# Patient Record
Sex: Female | Born: 1940 | Race: White | Hispanic: No | Smoking: Never smoker
Health system: Southern US, Community
[De-identification: ages and names within clinical notes are randomized; demographics above are authoritative.]

## PROBLEM LIST (undated history)

## (undated) DIAGNOSIS — E785 Hyperlipidemia, unspecified: Secondary | ICD-10-CM

## (undated) DIAGNOSIS — I10 Essential (primary) hypertension: Secondary | ICD-10-CM

## (undated) DIAGNOSIS — J189 Pneumonia, unspecified organism: Secondary | ICD-10-CM

## (undated) DIAGNOSIS — R0600 Dyspnea, unspecified: Secondary | ICD-10-CM

## (undated) DIAGNOSIS — N183 Chronic kidney disease, stage 3 unspecified: Secondary | ICD-10-CM

## (undated) DIAGNOSIS — K5792 Diverticulitis of intestine, part unspecified, without perforation or abscess without bleeding: Secondary | ICD-10-CM

## (undated) DIAGNOSIS — R011 Cardiac murmur, unspecified: Secondary | ICD-10-CM

## (undated) DIAGNOSIS — J42 Unspecified chronic bronchitis: Secondary | ICD-10-CM

## (undated) DIAGNOSIS — I82409 Acute embolism and thrombosis of unspecified deep veins of unspecified lower extremity: Secondary | ICD-10-CM

## (undated) DIAGNOSIS — I779 Disorder of arteries and arterioles, unspecified: Secondary | ICD-10-CM

## (undated) DIAGNOSIS — M1990 Unspecified osteoarthritis, unspecified site: Secondary | ICD-10-CM

## (undated) DIAGNOSIS — I739 Peripheral vascular disease, unspecified: Secondary | ICD-10-CM

## (undated) DIAGNOSIS — S0990XA Unspecified injury of head, initial encounter: Secondary | ICD-10-CM

## (undated) DIAGNOSIS — I499 Cardiac arrhythmia, unspecified: Secondary | ICD-10-CM

## (undated) DIAGNOSIS — I4891 Unspecified atrial fibrillation: Secondary | ICD-10-CM

## (undated) DIAGNOSIS — R112 Nausea with vomiting, unspecified: Secondary | ICD-10-CM

## (undated) DIAGNOSIS — R931 Abnormal findings on diagnostic imaging of heart and coronary circulation: Secondary | ICD-10-CM

## (undated) DIAGNOSIS — I2510 Atherosclerotic heart disease of native coronary artery without angina pectoris: Secondary | ICD-10-CM

## (undated) DIAGNOSIS — C4402 Squamous cell carcinoma of skin of lip: Secondary | ICD-10-CM

## (undated) DIAGNOSIS — Z9889 Other specified postprocedural states: Secondary | ICD-10-CM

## (undated) DIAGNOSIS — Z9289 Personal history of other medical treatment: Secondary | ICD-10-CM

## (undated) HISTORY — DX: Chronic kidney disease, stage 3 (moderate): N18.3

## (undated) HISTORY — DX: Unspecified atrial fibrillation: I48.91

## (undated) HISTORY — DX: Hyperlipidemia, unspecified: E78.5

## (undated) HISTORY — DX: Diverticulitis of intestine, part unspecified, without perforation or abscess without bleeding: K57.92

## (undated) HISTORY — DX: Chronic kidney disease, stage 3 unspecified: N18.30

## (undated) HISTORY — DX: Peripheral vascular disease, unspecified: I73.9

## (undated) HISTORY — DX: Essential (primary) hypertension: I10

## (undated) HISTORY — PX: KNEE ARTHROSCOPY: SHX127

## (undated) HISTORY — DX: Disorder of arteries and arterioles, unspecified: I77.9

## (undated) HISTORY — DX: Abnormal findings on diagnostic imaging of heart and coronary circulation: R93.1

## (undated) HISTORY — PX: MOHS SURGERY: SUR867

## (undated) HISTORY — PX: CARDIAC CATHETERIZATION: SHX172

## (undated) HISTORY — PX: TUBAL LIGATION: SHX77

---
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
## 1978-04-25 HISTORY — PX: VAGINAL HYSTERECTOMY: SUR661

## 1999-04-26 DIAGNOSIS — I82409 Acute embolism and thrombosis of unspecified deep veins of unspecified lower extremity: Secondary | ICD-10-CM

## 1999-04-26 HISTORY — DX: Acute embolism and thrombosis of unspecified deep veins of unspecified lower extremity: I82.409

## 2000-07-18 ENCOUNTER — Ambulatory Visit (HOSPITAL_COMMUNITY): Admission: RE | Admit: 2000-07-18 | Discharge: 2000-07-18 | Payer: Self-pay | Admitting: Orthopedic Surgery

## 2000-08-16 ENCOUNTER — Encounter (HOSPITAL_COMMUNITY): Admission: RE | Admit: 2000-08-16 | Discharge: 2000-09-15 | Payer: Self-pay | Admitting: Oncology

## 2000-08-16 ENCOUNTER — Encounter: Admission: RE | Admit: 2000-08-16 | Discharge: 2000-08-16 | Payer: Self-pay | Admitting: Oncology

## 2000-08-16 ENCOUNTER — Encounter: Payer: Self-pay | Admitting: Internal Medicine

## 2000-12-14 ENCOUNTER — Ambulatory Visit (HOSPITAL_COMMUNITY): Admission: RE | Admit: 2000-12-14 | Discharge: 2000-12-14 | Payer: Self-pay | Admitting: Internal Medicine

## 2000-12-14 ENCOUNTER — Encounter: Payer: Self-pay | Admitting: Internal Medicine

## 2001-01-23 DIAGNOSIS — Z9289 Personal history of other medical treatment: Secondary | ICD-10-CM

## 2001-01-23 HISTORY — DX: Personal history of other medical treatment: Z92.89

## 2001-01-23 HISTORY — PX: CORONARY ARTERY BYPASS GRAFT: SHX141

## 2001-02-07 ENCOUNTER — Encounter: Payer: Self-pay | Admitting: Cardiology

## 2001-02-08 ENCOUNTER — Encounter: Payer: Self-pay | Admitting: Cardiothoracic Surgery

## 2001-02-08 ENCOUNTER — Inpatient Hospital Stay (HOSPITAL_COMMUNITY): Admission: RE | Admit: 2001-02-08 | Discharge: 2001-02-14 | Payer: Self-pay | Admitting: Cardiology

## 2001-02-09 ENCOUNTER — Encounter: Payer: Self-pay | Admitting: Cardiothoracic Surgery

## 2001-02-10 ENCOUNTER — Encounter: Payer: Self-pay | Admitting: Cardiothoracic Surgery

## 2001-02-11 ENCOUNTER — Encounter: Payer: Self-pay | Admitting: Cardiothoracic Surgery

## 2001-02-28 ENCOUNTER — Ambulatory Visit (HOSPITAL_COMMUNITY): Admission: RE | Admit: 2001-02-28 | Discharge: 2001-02-28 | Payer: Self-pay | Admitting: Cardiovascular Disease

## 2001-02-28 ENCOUNTER — Encounter: Payer: Self-pay | Admitting: Cardiovascular Disease

## 2001-03-09 ENCOUNTER — Encounter: Admission: RE | Admit: 2001-03-09 | Discharge: 2001-03-09 | Payer: Self-pay | Admitting: Cardiothoracic Surgery

## 2001-03-09 ENCOUNTER — Encounter: Payer: Self-pay | Admitting: Cardiothoracic Surgery

## 2001-03-12 ENCOUNTER — Encounter (HOSPITAL_COMMUNITY): Admission: RE | Admit: 2001-03-12 | Discharge: 2001-04-11 | Payer: Self-pay | Admitting: Cardiology

## 2001-04-13 ENCOUNTER — Encounter (HOSPITAL_COMMUNITY): Admission: RE | Admit: 2001-04-13 | Discharge: 2001-05-13 | Payer: Self-pay | Admitting: Cardiology

## 2002-10-24 ENCOUNTER — Inpatient Hospital Stay (HOSPITAL_COMMUNITY): Admission: EM | Admit: 2002-10-24 | Discharge: 2002-10-26 | Payer: Self-pay | Admitting: *Deleted

## 2002-10-24 ENCOUNTER — Encounter: Payer: Self-pay | Admitting: *Deleted

## 2003-06-05 ENCOUNTER — Inpatient Hospital Stay (HOSPITAL_COMMUNITY): Admission: EM | Admit: 2003-06-05 | Discharge: 2003-06-07 | Payer: Self-pay | Admitting: Emergency Medicine

## 2004-02-12 ENCOUNTER — Ambulatory Visit (HOSPITAL_COMMUNITY): Admission: RE | Admit: 2004-02-12 | Discharge: 2004-02-12 | Payer: Self-pay | Admitting: Family Medicine

## 2005-09-24 ENCOUNTER — Ambulatory Visit (HOSPITAL_COMMUNITY): Admission: RE | Admit: 2005-09-24 | Discharge: 2005-09-24 | Payer: Self-pay | Admitting: Internal Medicine

## 2005-10-21 ENCOUNTER — Encounter: Admission: RE | Admit: 2005-10-21 | Discharge: 2005-10-21 | Payer: Self-pay | Admitting: Nephrology

## 2005-10-21 ENCOUNTER — Encounter (HOSPITAL_COMMUNITY): Admission: RE | Admit: 2005-10-21 | Discharge: 2005-11-20 | Payer: Self-pay | Admitting: Nephrology

## 2005-10-21 ENCOUNTER — Ambulatory Visit (HOSPITAL_COMMUNITY): Payer: Self-pay | Admitting: Oncology

## 2005-10-23 HISTORY — PX: COLONOSCOPY: SHX174

## 2005-11-10 ENCOUNTER — Ambulatory Visit: Payer: Self-pay | Admitting: Gastroenterology

## 2005-11-10 ENCOUNTER — Ambulatory Visit (HOSPITAL_COMMUNITY): Admission: RE | Admit: 2005-11-10 | Discharge: 2005-11-10 | Payer: Self-pay | Admitting: Gastroenterology

## 2005-11-16 ENCOUNTER — Encounter (HOSPITAL_COMMUNITY): Admission: RE | Admit: 2005-11-16 | Discharge: 2005-12-16 | Payer: Self-pay | Admitting: Oncology

## 2005-12-19 ENCOUNTER — Encounter: Admission: RE | Admit: 2005-12-19 | Discharge: 2005-12-19 | Payer: Self-pay | Admitting: Oncology

## 2005-12-19 ENCOUNTER — Ambulatory Visit (HOSPITAL_COMMUNITY): Payer: Self-pay | Admitting: Oncology

## 2006-10-02 ENCOUNTER — Emergency Department (HOSPITAL_COMMUNITY): Admission: EM | Admit: 2006-10-02 | Discharge: 2006-10-02 | Payer: Self-pay | Admitting: Emergency Medicine

## 2007-06-28 ENCOUNTER — Ambulatory Visit (HOSPITAL_COMMUNITY): Admission: RE | Admit: 2007-06-28 | Discharge: 2007-06-28 | Payer: Self-pay | Admitting: Family Medicine

## 2007-10-24 ENCOUNTER — Ambulatory Visit (HOSPITAL_COMMUNITY): Admission: RE | Admit: 2007-10-24 | Discharge: 2007-10-24 | Payer: Self-pay | Admitting: Internal Medicine

## 2008-02-07 ENCOUNTER — Emergency Department (HOSPITAL_COMMUNITY): Admission: EM | Admit: 2008-02-07 | Discharge: 2008-02-07 | Payer: Self-pay | Admitting: Emergency Medicine

## 2008-07-04 HISTORY — PX: US ECHOCARDIOGRAPHY: HXRAD669

## 2008-08-18 HISTORY — PX: NM MYOCAR PERF WALL MOTION: HXRAD629

## 2010-03-25 ENCOUNTER — Inpatient Hospital Stay (HOSPITAL_COMMUNITY)
Admission: EM | Admit: 2010-03-25 | Discharge: 2010-03-28 | Payer: Self-pay | Source: Home / Self Care | Admitting: Emergency Medicine

## 2010-03-25 HISTORY — PX: COLONOSCOPY: SHX174

## 2010-03-29 ENCOUNTER — Encounter (INDEPENDENT_AMBULATORY_CARE_PROVIDER_SITE_OTHER): Payer: Self-pay | Admitting: *Deleted

## 2010-04-01 ENCOUNTER — Encounter: Payer: Self-pay | Admitting: Gastroenterology

## 2010-04-02 ENCOUNTER — Telehealth (INDEPENDENT_AMBULATORY_CARE_PROVIDER_SITE_OTHER): Payer: Self-pay

## 2010-04-05 ENCOUNTER — Ambulatory Visit (HOSPITAL_COMMUNITY)
Admission: RE | Admit: 2010-04-05 | Discharge: 2010-04-05 | Payer: Self-pay | Source: Home / Self Care | Attending: Gastroenterology | Admitting: Gastroenterology

## 2010-04-05 ENCOUNTER — Ambulatory Visit: Payer: Self-pay | Admitting: Gastroenterology

## 2010-05-10 ENCOUNTER — Encounter (INDEPENDENT_AMBULATORY_CARE_PROVIDER_SITE_OTHER): Payer: Self-pay | Admitting: *Deleted

## 2010-05-25 NOTE — Progress Notes (Addendum)
Summary: phone note to cx appt for 04/05/2010  Phone Note Call from Patient   Caller: Patient Summary of Call: Pt said she is doing better, no diarrhea , just some loose stools. Said she is suppose to cancel appt on Mon 12th with Vicente Males since she is doing better and she has a CT scheduled for 2:15.  Initial call taken by: Waldon Merl LPN,  December  9, [id number] 2:25 PM     Appended Document: phone note to cx appt for 04/05/2010 Agree w/ above.  Appended Document: phone note to cx appt for 04/05/2010 Pt informed, and Manuela Schwartz is aware.

## 2010-05-25 NOTE — Miscellaneous (Signed)
Summary: D/C SUMMARY  Clinical Lists Changes  NAME:  Sandra Bradford, Sandra Bradford                  ACCOUNT NO.:  [id number]      MEDICAL RECORD NO.:  [id number]          PATIENT TYPE:  INP      LOCATION:  A331                          FACILITY:  [facility]      PHYSICIAN:  Evie Lacks. Plotnikov, MDDATE OF BIRTH:  05/23/1940      DATE OF ADMISSION:  03/25/2010   DATE OF DISCHARGE:  LH                                  DISCHARGE SUMMARY         DISCHARGE DIAGNOSES:   1. Diarrhea with hematochezia most likely due to ischemic colitis       versus infectious colitis versus other.  Most likely       diagnosis is ischemic colitis.  She is status post colonoscopy.   2. Anemia.   3. Acute renal insufficiency, resolved.   4. Coronary artery disease, asymptomatic at present.   5. Rash, most likely allergic due to red rice, yeast consumption vs other.   6. Left distal shin lesion, suspicious for skin cancer.      DISCHARGE MEDICATIONS:   1. Cipro 500 mg twice daily.   2. Metronidazole 500 mg twice daily.   3. Protonix 40 mg daily.   4. Triamcinolone 0.5% cream twice daily.   5. Enteric-coated aspirin 325 mg daily.   6. Metoprolol 25 mg twice daily.   7. Quinapril 5 mg daily.   8. Triamterene/hydrochlorothiazide 37.5/25 mg daily.   9. Vitamin D3 2000 units daily.      ALLERGIES:  CODEINE gives her nausea and vomiting.      CONSULTATIONS:  Barney Drain, MD, Gastroenterology.      PROCEDURES:  Colonoscopy by Dr. Oneida Alar.      HISTORY:  The patient is a 70 year old female who was admitted by Dr.   Jonelle Sidle with complaint of diarrhea, bright red blood in the rectum.  She   went to the emergency room with several bouts of diarrhea.  Her stools   were mixed with blood.  She had mild nausea and no vomiting.  For the   details, please address to Dr. Leontine Locket history and physical.      HOSPITAL COURSE:  During the course of hospitalization, the patient was   started empirically on IV antibiotics.  She was hydrated.  She  was put   in isolation.  She underwent colonoscopy on March 27, 2010.  Her   condition has gradually improved.  On the day of discharge, she is   feeling well.  She is able to eat, has had no diarrhea since colonoscopy   prep.  No blood in the stool, no chest pain, no nausea, vomiting.  She   has been able to ambulate well.      PHYSICAL EXAMINATION:  VITAL SIGNS:  On the day of discharge, her   temperature is 98.2, T-max 98.4, heart rate 83, respirations 14, blood   pressure 113/65, O2 sats 94% room air.   GENERAL:  She is no acute distress.  No pallor.   HEENT:  Moist.   NECK:  Supple.   LUNGS:  Clear.  No wheezes.   CARDIAC:  Heart S1 and S2, grade 2/6 systolic murmur.   ABDOMEN:  Soft, nontender and nondistended.  No organomegaly, no masses   felt.  Lower extremity is without edema.  Calves nontender.  She is   alert, oriented and cooperative.   SKIN:  With scaly papule on the left distal anterior shin and scaly   erythematous patches over thighs and her trunk.      LABORATORY DATA ON THE DAY OF DISCHARGE:  Her hemoglobin is 10.5, white   cell count 6.8, platelets 121.  Sodium 141, potassium 3.8, creatinine   1.25, BUN 11.  Her hemoglobin on March 25, 2010, was 12.8.  Her   platelet count was normal.  Her initial creatinine was 1.35 with BUN of   22.  Troponin and CK-MBs were all negative x3.  Urinalysis normal.  CT   of the abdomen without acute findings in the abdomen or pelvis.  Colonic   diverticulosis without acute diverticulitis.  Her colonoscopy revealed   normal terminal ileum.  Normal ileocecal valve, frequent diverticulosis   seen in the colon beginning in the distal ascending colon and extending   to the sigmoid colon.  There was patchy erythema, edema and ulcerations   beginning in the proximal transverse colon and extending into the distal   descending colon.  She had the intervening normal mucosa.  Biopsies   obtained.  Otherwise; there were no masses, polyps or  AVMs.  There was   no inflammation appreciated in the sigmoid colon.  There were patchy   areas of erythema in the rectal without ulcerations.  Moderate internal   hemorrhoids were noted.      DIET:  Low fat, low residue, lactose free.      FOLLOWUP PLANS:   [place] Gerarda Fraction, MD, next week.   [place], MD in 2 weeks.   3. Skin biopsy L leg lesion was recommended      SPECIAL INSTRUCTIONS:   1. Increase your activity slowly.   2. Do not take red rice yeast tablets               [name], MD               AVP/MEDQ  D:  03/28/2010  T:  03/28/2010  Job:  [id number]      cc:   Sherrilee Gilles. Gerarda Fraction, MD   [phone]      Barney Drain, M.D.   [address]   [place] , [place] [address]      Electronically Signed by Lew Dawes MD on 03/28/2010 03:39:40 PM

## 2010-05-25 NOTE — Letter (Signed)
Summary: CTA ORDER  CTA ORDER   Imported By: Sofie Rower 04/01/2010 10:18:26  _____________________________________________________________________  External Attachment:    Type:   Image     Comment:   External Document  Appended Document: CTA ORDER APPROVAL CODE: [id number]

## 2010-05-27 NOTE — Miscellaneous (Signed)
Summary: CONSULTATION  Clinical Lists Changes NAME:  Sandra Bradford, Sandra Bradford                  ACCOUNT NO.:  [id number]      MEDICAL RECORD NO.:  [id number]          PATIENT TYPE:  INP      LOCATION:  A331                          FACILITY:  [facility]      PHYSICIAN:  Barney Drain, M.D.     DATE OF BIRTH:  05/19/1940      DATE OF CONSULTATION:  03/26/2010   DATE OF DISCHARGE:                                    CONSULTATION         REASON FOR CONSULTATION:  Bloody diarrhea.      HISTORY OF PRESENT ILLNESS:  The patient is a very pleasant 70 year old   Caucasian female with past medical history significant for coronary   artery disease, dyslipidemia, hypertension who presented to the   emergency department yesterday after developing recurrent bouts of   abdominal cramping and diarrhea.  This episode however was different   than those in the past in that she did develop bloody stools.  She   states she had about six to eight loose stools between noon and  7   o'clock yesterday.  The stools initially were clear, but later she noted   bright red blood.  She denies any blood clots.  She describes it as   moderate volume.  She had no vomiting, fever or chills.  Denies any ill   contacts.  She does recall having intermittent abdominal cramping and   diarrhea over the last 6 months.  She may have episodes that last 1-2   days and may go a week in between without any symptoms.  She would self-   medicate with Imodium as needed.  She felt it was just something that   she was eating that caused the diarrhea.  She denies any weight loss.      In the emergency department she had a digital rectal exam that showed   maroon blood in the rectal vault.  Her initial hemoglobin was 13.3.   This morning it is down to 12.4.  She is also noted to have a creatinine   elevated at 1.35, BUN was normal at 22.  Her INR is normal at 1.04.   This morning her creatinine remains elevated at 1.32.  She had a CT of   the abdomen and  pelvis which was basically unremarkable except for   colonic diverticulosis.  She had scattered atherosclerotic   calcifications in the aorta.  Major branch vessels were patent.      She takes aspirin daily.  No other NSAIDs.  She had a colonoscopy back   in July 2007 by Dr. Oneida Alar that showed ascending and sigmoid   diverticulosis but no other abnormalities.  She has no family history of   colon cancer or inflammatory bowel disease.      MEDICATIONS AT HOME:   1. Quinapril 5 mg daily.   2. Aspirin 325 mg daily.   3. Toprol XL 25 mg b.i.d.   4. Red yeast rice daily.   5. No other NSAIDs.  ALLERGIES:  CODEINE CAUSES NAUSEA AND VOMITING.      PAST MEDICAL HISTORY:   1. CAD status post CABG in 2002.   2. Dyslipidemia.   3. Hypertension.   4. Bronchitis.   5. History of phlebitis.   6. Colonoscopy as outlined above.   7. History of rheumatic fever at age 1 with residual murmur.   8. She has had tonsillectomy, tubal ligation, hysterectomy, right knee       surgery.      FAMILY HISTORY:  Mother deceased due to heart disease.  Father deceased   due to CHF.  No family history of chronic GI illnesses, colorectal   cancer or IBD.      SOCIAL HISTORY:  She is retired.  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  She is a nonsmoker.  No drug use.  Occasional alcohol use.   She has two daughters and one son who are very supportive.      REVIEW OF SYSTEMS:  See HPI for GI.  CONSTITUTIONAL:  Denies any weight   loss.  CARDIOPULMONARY:  Denies chest pain, shortness of breath,   palpitations or cough.  GENITOURINARY:  Denies any dysuria or hematuria.      PHYSICAL EXAMINATION:  VITAL SIGNS:  Temperature 97.5, pulse 81,   respirations 18, blood pressure 115/68, height 62 inches, weight 84.6   kg.   GENERAL:  Pleasant obese Caucasian female in no acute distress.   SKIN:  Warm and dry, no jaundice.   HEENT:  Sclerae nonicteric.  Oropharyngeal mucosa moist and pink.  No    lesions, erythema or exudate.  No lymphadenopathy or thyromegaly.   CHEST:  Lungs are clear to auscultation.   CARDIAC:  Regular rate and rhythm.  Soft murmur heard throughout the   precordium.  No gallops or rubs.   ABDOMEN:  Obese, positive bowel sounds, soft, nontender, no organomegaly   or masses.  No rebound or guarding.  No abdominal bruits or hernias.   LOWER EXTREMITIES:  Left lower extremity with slight edema compared to   right which is a chronic finding status post vein removal.      LABORATORY DATA:  Initially her white count was 12,800 down to 10,600   today.  Initially her hemoglobin was 13.3 down to 12.4.  Platelet count   218,000.  Sodium 138, potassium 4.4, glucose 95, total bilirubin 0.6,   alk phos 41, AST 18, ALT 15, albumin 3.2, lactic acid 1.2, lipase 34.   Urinalysis negative.  INR 1.04.  Otherwise mentioned above.  CT as   mentioned above.      IMPRESSION:  The patient is a very pleasant 70 year old Caucasian female   with history of coronary artery disease, hypertension, dyslipidemia who   presents with first episode of bloody diarrhea in the setting of chronic   intermittent abdominal cramps and nonbloody diarrhea which has been   persisting for 6 months.  CT is unremarkable except for diverticulosis.   I doubt that we are dealing with a diverticular bleed given her   abdominal pain and chronic intermittent symptoms.  Would consider the   possibility of ischemic colitis, especially with history of CAD, no   remarkable findings on CT but may be a mild case.  I doubt we are   dealing with an infectious etiology or malignancy.  IBD as well as   microscopic colitis both remain in the differential at this time.  RECOMMENDATIONS:   1. Supportive measures.   2. Stool studies to include CDiff and culture.   3. The patient is complaining of discomfort with TED hose, therefore       will switch her to sequential compression       devices.   4. Will discuss case  with Dr. Oneida Alar, consider possible colonoscopy.   5. Further recommendations to follow.               Neil Crouch, P.A.         ______________________________   Barney Drain, M.D.            LL/MEDQ  D:  03/26/2010  T:  03/26/2010  Job:  [id number]      cc:   Sherrilee Gilles. Gerarda Fraction, MD   [phone]      Barbette Merino, M.D.      Electronically Signed by Precious Bard. on 03/26/2010 10:38:05 AM   Electronically Signed by Barney Drain M.D. on 05/06/2010 07:06:52 PM

## 2010-07-05 LAB — DIFFERENTIAL
Basophils Absolute: 0 10*3/uL (ref 0.0–0.1)
Basophils Relative: 0 % (ref 0–1)
Basophils Relative: 0 % (ref 0–1)
Basophils Relative: 0 % (ref 0–1)
Eosinophils Absolute: 0.1 10*3/uL (ref 0.0–0.7)
Eosinophils Relative: 0 % (ref 0–5)
Eosinophils Relative: 3 % (ref 0–5)
Lymphocytes Relative: 19 % (ref 12–46)
Lymphocytes Relative: 31 % (ref 12–46)
Lymphocytes Relative: 36 % (ref 12–46)
Lymphs Abs: 2.3 10*3/uL (ref 0.7–4.0)
Lymphs Abs: 2.4 10*3/uL (ref 0.7–4.0)
Lymphs Abs: 3.1 10*3/uL (ref 0.7–4.0)
Monocytes Absolute: 0.6 10*3/uL (ref 0.1–1.0)
Monocytes Absolute: 0.6 10*3/uL (ref 0.1–1.0)
Monocytes Absolute: 0.6 10*3/uL (ref 0.1–1.0)
Monocytes Relative: 10 % (ref 3–12)
Monocytes Relative: 6 % (ref 3–12)
Monocytes Relative: 9 % (ref 3–12)
Neutro Abs: 3.3 10*3/uL (ref 1.7–7.7)
Neutro Abs: 3.8 10*3/uL (ref 1.7–7.7)
Neutro Abs: 6.7 10*3/uL (ref 1.7–7.7)
Neutro Abs: 9.4 10*3/uL — ABNORMAL HIGH (ref 1.7–7.7)
Neutrophils Relative %: 51 % (ref 43–77)
Neutrophils Relative %: 64 % (ref 43–77)
Neutrophils Relative %: 74 % (ref 43–77)

## 2010-07-05 LAB — BASIC METABOLIC PANEL
BUN: 11 mg/dL (ref 6–23)
Calcium: 8.7 mg/dL (ref 8.4–10.5)
Chloride: 109 mEq/L (ref 96–112)
GFR calc Af Amer: 56 mL/min — ABNORMAL LOW (ref >60)
GFR calc non Af Amer: 42 mL/min — ABNORMAL LOW (ref >60)
GFR calc non Af Amer: 47 mL/min — ABNORMAL LOW (ref >60)
Glucose, Bld: 99 mg/dL (ref 70–99)
Potassium: 3.6 mEq/L (ref 3.5–5.1)
Potassium: 3.8 mEq/L (ref 3.5–5.1)
Sodium: 140 mEq/L (ref 135–145)
Sodium: 141 mEq/L (ref 135–145)

## 2010-07-05 LAB — CARDIAC PANEL(CRET KIN+CKTOT+MB+TROPI)
CK, MB: 1.2 ng/mL (ref 0.3–4.0)
CK, MB: 1.4 ng/mL (ref 0.3–4.0)
Relative Index: INVALID (ref 0.0–2.5)
Relative Index: INVALID (ref 0.0–2.5)
Total CK: 51 U/L (ref 7–177)
Total CK: 78 U/L (ref 7–177)
Troponin I: <0.01 ng/mL (ref 0.00–0.06)

## 2010-07-05 LAB — PROTIME-INR: Prothrombin Time: 13.8 seconds (ref 11.6–15.2)

## 2010-07-05 LAB — CBC
HCT: 31.5 % — ABNORMAL LOW (ref 36.0–46.0)
HCT: 32.4 % — ABNORMAL LOW (ref 36.0–46.0)
HCT: 33.2 % — ABNORMAL LOW (ref 36.0–46.0)
HCT: 36.8 % (ref 36.0–46.0)
HCT: 39.1 % (ref 36.0–46.0)
Hemoglobin: 10.5 g/dL — ABNORMAL LOW (ref 12.0–15.0)
Hemoglobin: 10.8 g/dL — ABNORMAL LOW (ref 12.0–15.0)
Hemoglobin: 11.3 g/dL — ABNORMAL LOW (ref 12.0–15.0)
Hemoglobin: 12.4 g/dL (ref 12.0–15.0)
Hemoglobin: 13.3 g/dL (ref 12.0–15.0)
MCH: 29.2 pg (ref 26.0–34.0)
MCHC: 33.3 g/dL (ref 30.0–36.0)
MCHC: 33.3 g/dL (ref 30.0–36.0)
MCHC: 33.7 g/dL (ref 30.0–36.0)
MCHC: 34 g/dL (ref 30.0–36.0)
MCV: 86.1 fL (ref 78.0–100.0)
MCV: 86.7 fL (ref 78.0–100.0)
MCV: 87.7 fL (ref 78.0–100.0)
Platelets: 208 10*3/uL (ref 150–400)
RBC: 3.74 MIL/uL — ABNORMAL LOW (ref 3.87–5.11)
RBC: 3.83 MIL/uL — ABNORMAL LOW (ref 3.87–5.11)
RBC: 4.24 MIL/uL (ref 3.87–5.11)
WBC: 12.5 10*3/uL — ABNORMAL HIGH (ref 4.0–10.5)
WBC: 6.5 10*3/uL (ref 4.0–10.5)

## 2010-07-05 LAB — COMPREHENSIVE METABOLIC PANEL
ALT: 18 U/L (ref 0–35)
AST: 18 U/L (ref 0–37)
Albumin: 3.2 g/dL — ABNORMAL LOW (ref 3.5–5.2)
Alkaline Phosphatase: 51 U/L (ref 39–117)
BUN: 22 mg/dL (ref 6–23)
CO2: 24 mEq/L (ref 19–32)
Calcium: 8.6 mg/dL (ref 8.4–10.5)
Chloride: 106 mEq/L (ref 96–112)
Creatinine, Ser: 1.32 mg/dL — ABNORMAL HIGH (ref 0.4–1.2)
GFR calc Af Amer: 48 mL/min — ABNORMAL LOW (ref >60)
GFR calc non Af Amer: 39 mL/min — ABNORMAL LOW (ref >60)
Glucose, Bld: 105 mg/dL — ABNORMAL HIGH (ref 70–99)
Potassium: 4 mEq/L (ref 3.5–5.1)
Sodium: 138 mEq/L (ref 135–145)
Total Bilirubin: 0.6 mg/dL (ref 0.3–1.2)

## 2010-07-05 LAB — HEMOGLOBIN AND HEMATOCRIT, BLOOD: Hemoglobin: 11.9 g/dL — ABNORMAL LOW (ref 12.0–15.0)

## 2010-07-05 LAB — URINALYSIS, ROUTINE W REFLEX MICROSCOPIC
Bilirubin Urine: NEGATIVE
Glucose, UA: NEGATIVE mg/dL
Hgb urine dipstick: NEGATIVE
Nitrite: NEGATIVE
Specific Gravity, Urine: 1.01 (ref 1.005–1.030)
pH: 5 (ref 5.0–8.0)

## 2010-07-05 LAB — STOOL CULTURE

## 2010-07-05 LAB — TSH: TSH: 0.989 u[IU]/mL (ref 0.350–4.500)

## 2010-07-05 LAB — LIPASE, BLOOD: Lipase: 34 U/L (ref 11–59)

## 2010-09-10 NOTE — Op Note (Signed)
Sandra Bradford, MATHSON NO.:  [id number]   MEDICAL RECORD NO.:  [id number]          PATIENT TYPE:  AMB   LOCATION:  DAY                           FACILITY:  [facility]   PHYSICIAN:  Caro Hight, M.D.      DATE OF BIRTH:  1940/07/19   DATE OF PROCEDURE:  11/10/2005  DATE OF DISCHARGE:                                 OPERATIVE REPORT   PROCEDURE:  Colonoscopy.   MEDICATIONS:  1.  Demerol 75 mg IV.  2.  Versed 4 mg IV.  3.  Phenergan 12.5 mg IV.   INDICATIONS:  [name] is a 70 year old female who presents for average risk  colon cancer screening.   FINDINGS:  1.  Ascending and sigmoid colon diverticulosis.  2.  Otherwise no masses, polyps, inflammatory changes, or vascular ectasia      seen.  3.  Normal retroflexed view of rectum.   RECOMMENDATIONS:  1.  Screening colonoscopy in 10 years.  2.  Follow with Gaston Islam. Tressie Stalker, M.D.   PROCEDURE TECHNIQUE:  The physical exam was performed and informed consent  was obtained from the patient after explaining all risks, benefits and  alternatives to the procedure which the patient appeared to understand and  so stated.  The patient was connected to monitoring device and placed in the  left lateral position.  Continuous oxygen was provided by nasal cannula and  IV medicines administered through an indwelling cannula.  After  administration of sedation, a rectal exam, the patient's rectum was  intubated and scope was advanced under direct  visualization to the cecum.  The scope was removed slowly by carefully  examining the color, texture, anatomy of mucosa on the way out.  The patient  was recovered in endoscopy suite and discharged home in satisfactory  condition.      Caro Hight, M.D.  Electronically Signed     SM/MEDQ  D:  11/10/2005  T:  11/10/2005  Job:  [id number]   cc:   Gaston Islam. Tressie Stalker, MD  Fax: [phone]

## 2010-09-10 NOTE — Discharge Summary (Signed)
NAME:  Sandra Bradford, Sandra Bradford                            ACCOUNT NO.:  [id number]   MEDICAL RECORD NO.:  [id number]                   PATIENT TYPE:  INP   LOCATION:  [id number]                                 FACILITY:  [facility]   PHYSICIAN:  Leslye Peer, MD                    DATE OF BIRTH:  [birth year]   DATE OF ADMISSION:  06/05/2003  DATE OF DISCHARGE:  06/07/2003                                 DISCHARGE SUMMARY   DISCHARGE DIAGNOSES:  1. Chest pain and back pain consistent with previous angina, catheterization     this admission revealing patent grafts.  2. Coronary artery disease with a history of coronary artery bypass grafting     in October of 2001 by Ivin Poot, M.D. with left internal mammary     artery to the left anterior descending, saphenous vein graft  to the     diagonal, saphenous vein graft to the obtuse marginal 1 and sequentially     to the posterior descending artery.  Good left ventricular function in     the past.  3. Treated hypertension.  4. Treated hyperlipidemia.   HOSPITAL COURSE:  The patient is a 70 year old female with a history of  hypertension, hyperlipidemia, and coronary artery disease.  She had bypass  surgery in October of 2001 by Dr. Prescott Gum.  She presented to Leslye Peer,  M.D.'s office on June 05, 2003, as an add-on with mid posterior and left  posterior back pain with radiation to her left shoulder which was similar to  her prebypass symptoms.  She was admitted to telemetry and started on IV  heparin.  CK-MB and troponins were obtained and were negative.  She was set  up for diagnostic catheterization on August 04, 2003, by Shelva Majestic, M.D.  which revealed patent grafts, preserved LV function, normal renal arteries,  and normal iliacs.  She was discharged on June 07, 2003.   DISCHARGE MEDICATIONS:  1. Toprol XL 50 mg a day.  2. Lescol XL 80 mg a day.  3. Zetia 10 mg a day.  4. Enteric-coated aspirin daily.  5. Accupril 5 mg a day.  6.  Advil p.r.n.   LABORATORY DATA:  EKG shows sinus rhythm without acute changes.   Hematology shows a white count of 7.8, hemoglobin 12.0, hematocrit 36.4, and  platelets 226.  INR 1.0. Sodium 135, potassium 3.7, BUN 15, creatinine 1.1.  Liver function tests are normal.  Hemoglobin A1C was 6.5, CK-MB and  troponins were negative x3. Lipid profile shows a cholesterol of 96, HDL 24,  LDL 58, TSH 1.31.   DISPOSITION:  The patient is discharged home.  The patient will follow up  with Dr. Mathis Bud in [place].      Erlene Quan, P.A.  Leslye Peer, MD    LKK/MEDQ  D:  06/24/2003  T:  06/25/2003  Job:  [id number]   cc:   Sherrilee Gilles. Gerarda Fraction, M.D.  [address] [id number]  Fax: [phone]

## 2010-09-10 NOTE — H&P (Signed)
   NAME:  Sandra Bradford, Sandra Bradford                            ACCOUNT NO.:  [id number]   MEDICAL RECORD NO.:  [id number]                   PATIENT TYPE:  INP   LOCATION:  A331                                 FACILITY:  [facility]   PHYSICIAN:  Leonides Grills, M.D.            DATE OF BIRTH:  Apr 29, 1940   DATE OF ADMISSION:  10/23/2002  DATE OF DISCHARGE:                                HISTORY & PHYSICAL   CHIEF COMPLAINT:  Short of breath.   HISTORY OF PRESENT ILLNESS:  This is a 70 year old white female who had been  seen in my office two days before with bronchitis, treated with a prednisone  Dosepak, doxycycline, and Levaquin.  Patient states she got extremely short  of breath, was unable to breathe.  In the emergency room, found to be very  tight with wheezes and rhonchi.  Patient was brought in for nebs and IV  steroids.  Initial chest reading was possible atypical pneumonia.  She is  admitted for trouble with respiratory problems.   PAST MEDICAL HISTORY:   ALLERGIES:  She is allergic to CODEINE, which causes nausea.   MEDICATIONS:  She takes, in addition to the above-mentioned meds:  1. Toprol 50 mg daily.  2. Aspirin daily.  3. Accupril 5 mg daily.  4. Lescol daily.   SOCIAL HISTORY:  She is a nonsmoker.  She does not use alcohol or drugs.   PAST SURGICAL HISTORY:  She does have a history of a CABG.   PHYSICAL EXAMINATION:  GENERAL:  A middle-aged white female who appears in  mild distress due to shortness of breath.  VITAL SIGNS:  Temp is 100.6, blood pressure is 150/75, pulse is 84,  respirations are 18.  HEENT:  TMs are normal.  Pupils are equal and reactive to light and  accommodation.  Oropharynx is slightly injected.  NECK:  Supple without JVD, bruit, or thyromegaly.  LUNGS:  With diffuse rales and rhonchi with wheezes throughout.  HEART:  With a regular sinus rhythm without murmur, gallop, or rub.  ABDOMEN:  Soft and nontender.  EXTREMITIES:  Without clubbing, cyanosis, or  edema.  NEUROLOGICAL EXAM:  Grossly intact.   ASSESSMENT:  1. Bronchitis plus or minus pneumonia.  2.     History of hypertension.  3. History of coronary artery bypass graft.  4. History of hyperlipidemia.                                               Leonides Grills, M.D.    WMM/MEDQ  D:  10/24/2002  T:  10/24/2002  Job:  [id number]

## 2010-09-10 NOTE — Discharge Summary (Signed)
   NAME:  Sandra Bradford, Sandra Bradford                            ACCOUNT NO.:  [id number]   MEDICAL RECORD NO.:  [id number]                   PATIENT TYPE:  INP   LOCATION:  [id number]                                 FACILITY:  [facility]   PHYSICIAN:  Leonides Grills, M.D.            DATE OF BIRTH:  November 26, 1940   DATE OF ADMISSION:  10/23/2002  DATE OF DISCHARGE:  10/26/2002                                 DISCHARGE SUMMARY   FINAL DIAGNOSES:  1. Bronchitis.  2. History of hyperlipidemia.  3. History of hypertension.   HOSPITAL COURSE:  This 70 year old female was admitted with severe  respiratory difficulty. She had been treated in the office a few days before  with oral prednisone and antibiotics and had gotten progressively worse.  In  the emergency room the patient had a normal white count and hemoglobin.  Chest x-ray was consistent with bronchitis and previous CABG.  The patient's  electrolytes showed some mild dehydration with BUN of 40 and creatinine of  1.8 on admission; 33 and 1.6 before discharge.  Blood cultures were  negative.   The patient was admitted to the floor and begun on IV steroids, as well as  IV antibiotics and nebulizer treatments and mucolytics.  She improved  significantly over her 3-day stay.  She was breathing easily with minimal  rhonchi.   The patient was discharged home in stable condition on Levaquin prednisone  Dosepak, home meds, and Advair inhaler.  She will be followed in the office  in 1 week.                                               Leonides Grills, M.D.    WMM/MEDQ  D:  11/11/2002  T:  11/11/2002  Job:  [id number]

## 2010-09-10 NOTE — Op Note (Signed)
[place]. [facility]  Patient:    Sandra Bradford, Sandra Bradford Visit Number: [id number] MRN: [id number]          Service Type: MED Location: [id number] Attending Physician:  Len Childs Dictated by:   Len Childs, M.D. Proc. Date: 02/08/01 Admit Date:  02/07/2001   CC:         CVTS Office  Kela Millin, M.D.   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  SURGEON:  Len Childs, M.D.  ASSISTANT:  Revonda Standard. Roxan Hockey, M.D. and [address]  ANESTHESIA:  General.  INDICATIONS:  The patient is a 70 year old female with a family history of coronary disease, hypertension, and hyperlipidemia with progressive symptoms of chest pain with increasing frequency and severity over the past three weeks.  A stress test was positive and cardiac catheterization by Kela Millin, M.D. demonstrated severe three-vessel coronary disease with 99% stenosis of the left anterior descending, 95% stenosis of the circumflex, and 95% stenosis of the right coronary artery with preserved LV function.  She was referred for surgical coronary revascularization.  Prior to the operation I examined the patient in the catheterization lab holding area and reviewed the results of the cardiac catheterization with the patient and her family.  I discussed the indications and expected benefits of coronary bypass surgery.  I reviewed the alternatives to surgical therapy for treatment of  her coronary artery disease.  I discussed the major aspects of the bypass operation including the location of the surgical incision, the choice of conduit, the use of general anesthesia and cardiopulmonary bypass, and the expected hospital recovery.  I reviewed the risks of the operation to the patient including risks of MI, CVA, bleeding, infection, and death.  She understood these implications for surgery and agreed to proceed with the operation as planned under what I felt was an informed consent.  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  The mammary artery was a small vessel, but with excellent flow.  The coronaries were small, but adequate targets.  The myocardium appeared to be without evidence of scarring or fibrosis.  DESCRIPTION OF PROCEDURE:  The patient was brought to the operating room and placed supine on the operating room table where general anesthesia was induced under invasive hemodynamic monitoring.  The chest, abdomen, and legs were prepped with Betadine and draped as a sterile field.  A median sternotomy was performed as the saphenous vein was harvested from the left leg.  The internal mammary artery was harvested as a pedicle graft from its origin at the subclavian vessels.  It was a small 1.5 mm vessel, but had excellent flow. Heparin was administered and the ACT was documented as being therapeutic. Through purse-strings placed in the ascending aorta and right atrium, the patient was the patient was cannulated and placed on bypass and cooled to 32 degrees.  The coronaries were identified and the mammary artery and vein grafts were prepared for the distal anastomoses.  A cardioplegia cannula was placed and the patient was cooled to 28 degrees.  As the aortic cross-clamp was applied, 500 cc of cold blood cardioplegia was delivered to the aortic root with immediate  cardioplegic arrest and septal temperature dropping to less than 12 degrees.  Topical ice saline slush was used to augment myocardial preservation and a pericardial insulator pad was used to protect the left phrenic nerve.  The distal coronary anastomoses were then performed.  The first distal anastomosis was to the posterior descending.  This was part of a sequential vein graft to the PD and PL.  The PD was a 1.8 mm vessel with proximal 99% stenosis and a side branch of the vein was side-to-side with a running 7-0 Prolene with good flow through the graft.  The second distal anastomosis was the continuation of the vein graft to the PL.  The posterolateral was a 1.2 mm vessel with proximal 70% stenosis at its bifurcation with the posterior descending.  An end-to-side anastomosis with running 7-0 Prolene was created and there was  good flow through the graft.  Cardioplegia was redosed.  The third distal anastomosis was to the first diagonal.  This was a 1.5 mm vessel with proximal 80% stenosis.  A reverse saphenous vein was sewn end-to-side with running 7-0 Prolene.  There was good flow through the graft.  This vein was a small vein.  The fourth distal anastomosis was to the OM-1.  This was a 1.8 mm vessel with proximal 95% stenosis and a reverse saphenous vein was sewn end-to-side with running 7-0 Prolene with good flow through the graft. Cardioplegia was redosed.  The fifth distal anastmosis was to the distal third of the LAD where it became epicardial in location from its more proximal location in the interventricular septum.  The left internal mammary artery pedicle was brought through an opening created in the left lateral pericardium, was brought down onto the LAD, and sewn end-to-side with a running 8-0 Prolene.  There was excellent flow through the anastomosis with immediate rise in septal temperature after release of the pedicle clamp on the  mammary artery.  The mammary pedicle  was secured to the epicardium and the aortic cross-clamp was removed.  The heart was resumed a spontaneous rhythm.  A partial occluding clamp was placed on the ascending aorta and three proximal vein anastomoses were performed using a 4.0 mm punch and running Prolene anastomoses.  The partial clamp was removed and the vein grafts were perfused.  Each had excellent flow and hemostasis was documented at the proximal and distal sites.  The patient was rewarmed to 37 degrees and temporary pacing wires were applied.  The lungs were then re-expanded and the ventilator was resumed.  The patient was then weaned from bypass with stable blood pressure and cardiac output without inotropic.  Protamine was administered and the cannulas were removed.  There was no reaction to the protamine.  The mediastinum was irrigated with warm antibiotic irrigation.  The leg incision was irrigated and closed over a Jackson-Pratt drain.  Coagulation continued to be abnormal, and the patient was given a dose of DDAVP and a dose of platelets and fresh frozen plasma; this improved coagulation.  The mediastinal fat was closed superiorly over the aorta and vein grafts.  Two mediastinal and a left pleural chest were placed and brought out through separate incisions.  The sternum was reapproximated with eight steel wires interrupted.  The pectoral fascia was closed with interrupted #1 Vicryl.  The subcutaneous and skin were closed with a running Vicryl and a sterile dressing was applied.  Total cardiopulmonary bypass time was 165 minutes with aortic cross-clamp time of 80 minutes. Dictated by:   Len Childs, M.D. Attending Physician:  Len Childs DD:  02/08/01 TD:  02/08/01 Job: [id number]

## 2010-09-10 NOTE — Cardiovascular Report (Signed)
[place]. [facility]  Patient:    Sandra Bradford, Sandra Bradford Visit Number: [id number] MRN: [id number]          Service Type: CAT Location: [id number] Attending Physician:  Laverda Page Dictated by:   Kela Millin, M.D. Proc. Date: 02/07/01 Admit Date:  02/07/2001   CC:         Elsie Lincoln, M.D., [facility]                        Cardiac Catheterization  PRIMARY ATTENDING PHYSICIAN: Elsie Lincoln, M.D.  PROCEDURES PERFORMED: 1. Selective left ventriculography. 2. Selective right and left coronary arteriography.  INDICATIONS: The patient is a 70 year old, white female, with a no significant past medical history presented to the outpatient evaluation for chest pain. She underwent routine exercise treadmill stress test which was moderately abnormal. Due to typical symptoms of angina pectoris and moderately abnormal cardiac stress test she was brought to the cardiac catheterization lab to evaluate her coronary anatomy.  HEMODYNAMIC DATA: Left ventricular pressure 130/8 with an end-diastolic pressure of 14. The central aortic pressure is 132/69 with a mean of 96 mmHg. There was no pressure gradient across the aortic valve.  ANGIOGRAPHIC DATA:  The left ventricular systolic function was well preserved and the ejection fraction was calculated at 60%.  Right coronary artery is a dominant vessel and is a large caliber vessel. The ostium of the right coronary artery has about 40-50% eccentric stenosis. The proximal segment also has about 95-99% stenosis. The mid to distal segment has about 40% stenosis and the distal vessels have mild luminal irregularity. The RCA gives origin to a large PLV and a moderate sized PDA branch.  Left main coronary artery: The left main coronary artery is a large caliber vessel. It is mildly calcified distally. It bifurcates in the left anterior descending and circumflex coronary artery.  Left anterior descending  artery. The left anterior descending artery is a large caliber vessel. It gives origin to several small septal perforators. It also gives origin to small diagonal #1 and diagonal #2 and a very large diagonal #3 vessel. The mid segment of the left anterior descending artery has about 60-70% stenosis noted after the origin of diagonal #2. Just after the origin of diagonal #3, the left anterior descending artery is high-grade, 95% stenosis. This was in the mid segment of the left anterior descending artery.  Circumflex coronary artery: The circumflex coronary artery is a nondominant vessel. However, it is a large caliber vessel. The ostium of the circumflex has about 70% calcified lesion. The mid segment has about 99% stenosis and there is a skip 80% stenosis. The circumflex bifurcates into obtuse marginal #1 and continues as obtuse marginal #2 after giving origin to a very small AV groove branch.  Left internal mammary artery: The left subclavian artery and left internal mammary artery is widely patent.  IMPRESSION: 1. Normal left ventricular systolic function, ejection fraction 60% 2. Severe two-vessel coronary artery disease.  RECOMMENDATIONS: Given her high-grade lesions, the patient will be admitted to the hospital and CABG will be planned for this admission. A consultation with CVTS has been obtained.  TECHNIQUE OF THE PROCEDURE:  Under the usual sterile precautions using a 6 French right femoral artery access, a 6 [place] multiple B2 catheter was advanced into the ascending aorta over the 0.035 mm J wire. The catheter was gently advanced to the left ventricle and left ventricular pressure monitored. Hand contrast injection  of the left ventricle was performed in the RAO projection. The catheter was flushed with saline and pulled back into the ascending aorta. Pressure gradient across the aortic valve was monitored. The right coronary artery was selectively engaged and arteriography  was performed. Then the catheter was disengaged from the right coronary ostium and left main coronary artery was selectively engaged and arteriography was performed. Because of streaming, the multiple B2 catheter was pulled out of body after performing the left subclavian and left internal mammary artery arteriogram and it was exchanged through a 6 [place] diagnostic Judkins left catheter and left main coronary artery was selectively engaged. Arteriotomy was performed. After obtaining adequate views, the catheter was pulled out of the body.  The arterial access sheath was then pulled. Hemostasis was obtained and the patient was transferred to the ward in stable condition. Dictated by:   Kela Millin, M.D. Attending Physician:  Laverda Page DD:  02/07/01 TD:  02/08/01 Job: [id number]

## 2010-09-10 NOTE — Cardiovascular Report (Signed)
NAME:  ESTY, BISCARDI                            ACCOUNT NO.:  [id number]   MEDICAL RECORD NO.:  [id number]                   PATIENT TYPE:  INP   LOCATION:  [id number]                                 FACILITY:  [facility]   PHYSICIAN:  Shelva Majestic, M.D.                  DATE OF BIRTH:  22-Aug-1940   DATE OF PROCEDURE:  06/06/2003  DATE OF DISCHARGE:  06/07/2003                              CARDIAC CATHETERIZATION   INDICATIONS:  Ms. Sandra Bradford is a very pleasant 70 year old female who  underwent CBG revascularization surgery following cardiac catheterization in  October 2002.  She ended up having a LIMA to her LAD, saphenous vein graft  to the diagonal, saphenous vein graft to marginal, saphenous vein graft  sequentially to the PD and PL vessel.  The patient had done well.  She  recently has developed episodes of chest discomfort and was seen as an add-  on by Dr. Mathis Bud yesterday.  The patient reported somewhat similar  symptoms to previously.  For this reason, she was transferred to [facility] with the presumptive diagnosis of possible unstable angina and  definitive diagnostic catheterization was recommended.   DESCRIPTION OF PROCEDURE:  After premedication with Valium 5 mg  intravenously, the patient was prepped and draped in the usual fashion.  Her  right femoral artery was punctured anteriorly and a 5 French sheath was  inserted.  Diagnostic catheterization was done with 5 French Judkins 4 left  and right coronary catheters, a No-Torque right catheter, left bypass graft  catheter and left internal mammary artery catheter. A 6 French pigtail  catheter was used for biplane cine left ventriculography as well as distal  aortography.  Hemostasis was obtained by direct manual pressure.  The  patient tolerated the procedure well.   HEMODYNAMIC DATA:  Central aortic pressure was 145/75, mean 107.  Left  ventricular pressure was 145/10, post A 21.   ANGIOGRAPHIC DATA:  The left main  coronary artery had segmental 30%  narrowings prior to giving rise to an LAD and circumflex vessel.   The LAD had proximal narrowing of approximately 30-40% before the first  diagonal vessel.  After the second diagonal vessel, there was segmental 90%  stenoses between two septal perforating arteries. A flush and fill phenomena  was seen distal to this secondary to  competitive filling.   The left circumflex coronary artery had 99% occluded marginal vessel  proximally and then had 80% stenosis.  There was diffuse stenoses of 80%  prior to the take off of the next marginal vessel which was bifurcating.  A  limb of this vessel was seen filling competitively due to the vein graft  flow.   The native right coronary artery had 95% proximal stenosis followed by 80%  stenosis.  There was diffuse 70-80% stenoses prior to the crux and before  the PDA with 70% diffuse stenoses in  the continuation between the PDA and  PLA vessel.   The sequential saphenous vein graft supplying the PD and PL vessel was  widely patent.   The vein graft supplying the circumflex marginal vessel superior limb was  widely patent.   The saphenous vein graft supplying the diagonal vessel was widely patent and  there was excellent filling of the LAD via this vessel distally.   The LIMA graft to the LAD was large caliber proximally, but small caliber  distally and anastomosed into the mid LAD.  Again, the distal portion of the  graft prior to the anastomosis was small caliber and appeared to be somewhat  atretic, but without stenosis.   Biplane cine left ventriculography revealed normal LV contractility without  focal segmental wall motion abnormalities.   Distal aortography did not demonstrate any renal artery stenosis.  There was  no significant aortoiliac disease.   IMPRESSION:  1. Normal left ventricular function.  2. Significant multivessel coronary artery disease with 30% left main     stenoses, 30% left  anterior descending stenosis with segmental 90% mid     left anterior descending stenoses, diffuse 80% stenosis of the circumflex     marginal vessel with subtotal circumflex marginal stenosis, and diffuse     95% proximal, 80% marginal, 70-80% distal stenoses of the right coronary     artery before the posterior descending artery take off with 70% stenosis     beyond the posterior descending artery take off.  3. Patent sequential vein graft supplying the posterior descending artery     and posterior lateral artery vessel of the right coronary artery.  4. Patent saphenous vein graft supplying the circumflex marginal vessel.  5. Patent saphenous vein graft supplying the diagonal vessel.  6. Patent left internal mammary artery to the left anterior descending, both     with somewhat atretic distal portion of the left internal mammary artery     graft most likely due to good filling of the left anterior descending via     the diagonal graft.   RECOMMENDATIONS:  Medical therapy.                                               Shelva Majestic, M.D.    TK/MEDQ  D:  06/06/2003  T:  06/07/2003  Job:  [id number]   cc:   Eden Lathe. Einar Gip, M.D.  [address], [address] [place] [address]  Fax: [place] Gerarda Fraction, M.D.  [address] [place] [address]  Fax: [phone]

## 2010-09-10 NOTE — Discharge Summary (Signed)
[place]. [facility]  Patient:    Sandra Bradford, Sandra Bradford Visit Number: [id number] MRN: [id number]          Service Type: MED Location: [id number] 2034 01 Attending Physician:  Len Childs Dictated by:   Vernard Gambles, P.A. Admit Date:  02/07/2001 Discharge Date: 02/14/2001   CC:         CVTS office  Kela Millin, M.D.  [place] M.D. in [place]   Discharge Summary  DATE OF BIRTH:  07/11/1940  PRIMARY ADMISSION DIAGNOSIS:  Three vessel coronary artery disease.  SECONDARY DIAGNOSES/PAST MEDICAL HISTORY: 1. Hypertension. 2. Hyperlipidemia. 3. Coronary artery disease, status post abnormal stress test and unstable    angina. 4. Positive family history of coronary artery disease.  NEW DIAGNOSES/DISCHARGE DIAGNOSES: 1. Status post coronary artery bypass graft surgery x 5. 2. Postoperative diarrhea, resolved. 3. Postoperative atrial fibrillation with rapid ventricular response,    resolved. 4. Postoperative oxygen dependence, resolved. 5. Postoperative hypokalemia, resolved.  PROCEDURES: 1. Cardiac catheterization done on February 07, 2001. 2. Pre coronary artery bypass graft surgery, Doppler evaluation done on    February 07, 2001. 3. Coronary artery bypass graft surgery x 5 on February 08, 2001, with the    following grafts placed:  Left internal mammary artery to left anterior    descending artery, saphenous vein graft to the diagonal branch, saphenous    vein graft to obtuse marginal branch, and sequential saphenous vein graft    to right coronary artery and posterior descending coronary artery.  HOSPITAL COURSE:  This patient is a 70 year old female with a family history of coronary artery disease, hypertension, and hyperlipidemia, with progressive symptoms of chest pain with increasing frequency and severity over the past three weeks.  A stress test was positive, and cardiac catheterization by Dr. Einar Gip had revealed severe three vessel coronary  artery disease.  She was referred for coronary revascularization.  Dr. Prescott Gum had seen and evaluated this patient, and felt that the patient would benefit from coronary revascularization.  The patient was notified of the indications, risks and benefits of the procedure, and agreed to proceed.  She underwent the surgery stated above on February 08, 2001.  Her postoperative course initially was uneventful.  She was extubated later that evening of the day of her surgery, tolerated that well.  She did not have any initial cardiac or respiratory complications.  Nevertheless, on postoperative day #4, the patient had developed postoperative atrial fibrillation with rapid ventricular response. She converted to normal sinus rhythm.  Was started on digoxin, and was continued on her Toprol.  In addition, the patient had postoperative oxygen dependence secondary to atelectasis and initially was tolerating room air without difficulty.  Thirdly, the patient did have some postoperative diarrhea which was since resolved.  In addition, she had some postoperative hypokalemia which has resolved.  She did not have any other GI complications.  Her wounds remained clean and dry without any signs of infection or complication.  She was ambulating daily by cardiac rehabilitation phase 1, and tolerated that well.  Again, she was tolerating room air oxygen, and she was anticipated for discharge on the morning of February 14, 2001.  CONDITION ON DISCHARGE:  Stable.  DISCHARGE MEDICATIONS: 1. Aspirin 325 mg q.d. 2. Prevacid, she is to continue her home dose. 3. Digoxin 0.125 mg q.d. 4. Toprol XL 25 mg q.d. 5. Lescol 80 mg p.o. at bedtime. 6. Tylox one or two tablets q.4h. p.r.n. pain. 7. She is also  instructed to continue her Evista, Os-Cal, Celebrex, and a    multivitamin daily.  ACTIVITY:  The patient is instructed not to do any driving, to avoid heavy lifting or strenuous activity.  She is to walk daily,  and continue her breathing exercises.  DIET:  She is to follow a heart healthy diet.  WOUND CARE:  She is told she can shower.  She is to notify our office, and the number was given, if she has any increased temperatures greater than 101 degrees Fahrenheit, or if she has any increased redness, swelling, or drainage from her incision.  FOLLOWUP: 1. February 19, 2001, at 10 a.m. at our office. 2. She is to call to see Dr. Einar Gip in two weeks. 3. She is then to follow up with Dr. Prescott Gum in three weeks on March 09, 2001, at 10 a.m. after getting a chest x-ray at [facility] at 8:30 a.m.  She is instructed to bring that chest x-ray with her    to her office visit. 4. She is also instructed to follow up with Dr. ______ as directed. Dictated by:   Vernard Gambles, P.A. Attending Physician:  Len Childs DD:  02/13/01 TD:  02/14/01 Job: [id number]
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
## 2011-01-24 LAB — URINALYSIS, ROUTINE W REFLEX MICROSCOPIC
Glucose, UA: NEGATIVE
Leukocytes, UA: NEGATIVE
Nitrite: NEGATIVE
Protein, ur: 30 — AB
Urobilinogen, UA: 2 — ABNORMAL HIGH

## 2011-01-24 LAB — DIFFERENTIAL
Basophils Absolute: 0
Basophils Relative: 0
Eosinophils Absolute: 0
Eosinophils Relative: 0

## 2011-01-24 LAB — CBC
HCT: 35.2 — ABNORMAL LOW
MCHC: 34.2
MCV: 87.4
Platelets: 204
RDW: 13.4

## 2011-01-24 LAB — BASIC METABOLIC PANEL
BUN: 18
Chloride: 101
Creatinine, Ser: 1.11
Glucose, Bld: 146 — ABNORMAL HIGH
Potassium: 3.5

## 2011-02-10 LAB — DIFFERENTIAL
Basophils Absolute: 0.1
Basophils Relative: 1
Eosinophils Relative: 0
Monocytes Absolute: 0.8 — ABNORMAL HIGH
Neutro Abs: 8.8 — ABNORMAL HIGH

## 2011-02-10 LAB — POCT CARDIAC MARKERS
CKMB, poc: <1 — ABNORMAL LOW
Myoglobin, poc: 61.8
Operator id: 264761
Troponin i, poc: <0.05

## 2011-02-10 LAB — STREP A DNA PROBE: Group A Strep Probe: NEGATIVE

## 2011-02-10 LAB — BASIC METABOLIC PANEL
CO2: 29
Calcium: 9.3
Glucose, Bld: 120 — ABNORMAL HIGH
Sodium: 135

## 2011-02-10 LAB — CBC
Hemoglobin: 13.1
MCHC: 34.9
RDW: 13.7

## 2011-05-24 ENCOUNTER — Encounter (HOSPITAL_COMMUNITY): Payer: Self-pay | Admitting: *Deleted

## 2011-05-24 ENCOUNTER — Emergency Department (HOSPITAL_COMMUNITY)
Admission: EM | Admit: 2011-05-24 | Discharge: 2011-05-24 | Disposition: A | Discharge status: Home / Self Care | Payer: Medicare Other | Attending: Emergency Medicine | Admitting: Emergency Medicine

## 2011-05-24 DIAGNOSIS — I2510 Atherosclerotic heart disease of native coronary artery without angina pectoris: Secondary | ICD-10-CM | POA: Insufficient documentation

## 2011-05-24 DIAGNOSIS — B349 Viral infection, unspecified: Secondary | ICD-10-CM

## 2011-05-24 DIAGNOSIS — R070 Pain in throat: Secondary | ICD-10-CM | POA: Insufficient documentation

## 2011-05-24 DIAGNOSIS — R059 Cough, unspecified: Secondary | ICD-10-CM | POA: Insufficient documentation

## 2011-05-24 DIAGNOSIS — R509 Fever, unspecified: Secondary | ICD-10-CM | POA: Insufficient documentation

## 2011-05-24 DIAGNOSIS — R05 Cough: Secondary | ICD-10-CM | POA: Insufficient documentation

## 2011-05-24 DIAGNOSIS — R51 Headache: Secondary | ICD-10-CM | POA: Insufficient documentation

## 2011-05-24 HISTORY — DX: Atherosclerotic heart disease of native coronary artery without angina pectoris: I25.10

## 2011-05-24 LAB — RAPID STREP SCREEN (MED CTR MEBANE ONLY): Streptococcus, Group A Screen (Direct): NEGATIVE

## 2011-05-24 MED ORDER — OSELTAMIVIR PHOSPHATE 75 MG PO CAPS: 75.0000 mg | ORAL_CAPSULE | Freq: Two times a day (BID) | ORAL | Status: AC

## 2011-05-24 NOTE — ED Notes (Addendum)
C/o sore throat, fever, HA and chills since yesterday around 1600, denies N/v/D or cough, last took Tylenol last night

## 2011-05-24 NOTE — ED Notes (Signed)
Pt c/o sore throat, fever, chills and headache since yesterday. Highest temp was 102 last night. Denies cough or congestion.

## 2011-05-24 NOTE — ED Notes (Signed)
Ginger ale given

## 2011-05-24 NOTE — ED Provider Notes (Signed)
History    This chart was scribed for Sandra Speak, MD, MD by Sandra Bradford. The patient was seen in room APA10 and the patient's care was started at 3:00PM.   CSN: [id number]  Arrival date & time 05/24/11  1155   First MD Initiated Contact with Patient 05/24/11 1501      Chief Complaint  Patient presents with  . Fever    (Consider location/radiation/quality/duration/timing/severity/associated sxs/prior treatment) The history is provided by the patient.   Sandra Bradford is a 71 y.o. female who presents to the Emergency Department complaining of persistent fever with chills and moderate headache onset 1 day ago. She reports a temperature of 102 last night. The symptoms started suddenly and have been constant since onset without radiation. Pt reports sore throat. Denies dysuria, diarrhea, nausea, vomiting and any sick contact. Pt reports heart surgery 10 years ago but no complications since. She denies any other health conditions. Pt has had flu vaccine this year.   Past Medical History  Diagnosis Date  . Coronary artery disease     Past Surgical History  Procedure Date  . Cardiac surgery     History reviewed. No pertinent family history.  History  Substance Use Topics  . Smoking status: Never Smoker   . Smokeless tobacco: Not on file  . Alcohol Use: No    OB History    Grav Para Term Preterm Abortions TAB SAB Ect Mult Living                  Review of Systems  Constitutional: Positive for fever.  All other systems reviewed and are negative.  10 Systems reviewed and are negative for acute change except as noted in the HPI.   Allergies  Codeine  Home Medications   Current Outpatient Rx  Name Route Sig Dispense Refill  . ACETAMINOPHEN 500 MG PO TABS Oral Take 1,000 mg by mouth every 6 (six) hours as needed. For pain    . ASPIRIN 325 MG PO TABS Oral Take 325 mg by mouth daily.    . COLESEVELAM HCL 625 MG PO TABS Oral Take 1,875 mg by mouth 2 (two) times daily with  a meal.    . METOPROLOL SUCCINATE ER 25 MG PO TB24 Oral Take 25 mg by mouth 2 (two) times daily.    . ADULT MULTIVITAMIN W/MINERALS CH Oral Take 1 tablet by mouth daily.    . QUINAPRIL HCL 5 MG PO TABS Oral Take 5 mg by mouth daily.    . TRIAMTERENE-HCTZ 37.5-25 MG PO CAPS Oral Take 1 capsule by mouth daily.      BP 138/58  Pulse 94  Temp(Src) 99.2 F (37.3 C) (Oral)  Resp 21  Ht 5\' 2"  (1.575 m)  Wt 180 lb (81.647 kg)  BMI 32.92 kg/m2  SpO2 97%  Physical Exam  Nursing note and vitals reviewed. Constitutional: She is oriented to person, place, and time. She appears well-developed and well-nourished.  HENT:  Head: Normocephalic and atraumatic.  Right Ear: External ear normal.  Left Ear: External ear normal.       Oropharynx moderately erythematous   Eyes: EOM are normal. Pupils are equal, round, and reactive to light.  Neck: Neck supple. No thyromegaly present.  Cardiovascular: Normal rate, regular rhythm and normal heart sounds.   Pulmonary/Chest: Effort normal and breath sounds normal. No respiratory distress.       Clear bilaterally   Abdominal: Soft. She exhibits no distension. There is no tenderness.  Musculoskeletal:  Normal range of motion.       No CVA tenderness  Neurological: She is alert and oriented to person, place, and time.  Skin: Skin is warm and dry.  Psychiatric: She has a normal mood and affect. Her behavior is normal.    ED Course  Procedures (including critical care time)  DIAGNOSTIC STUDIES: Oxygen Saturation is 97% on room air, normal by my interpretation.    COORDINATION OF CARE:  3:07PM EDP discusses pt ED treatment course with pt.    Labs Reviewed  RAPID STREP SCREEN   No results found.   No diagnosis found.    MDM  Patient presents with sudden onset of fever, chills, headache, sore throat, and non-productive cough the night before presentation.  A rapid strep was performed and was negative.  I suspect this is influenza.  Will  prescribe tamiflu, follow up as needed.  I personally performed the services described in this documentation, which was scribed in my presence. The recorded information has been reviewed and considered.        Sandra Speak, MD 05/25/11 [phone]

## 2011-10-08 ENCOUNTER — Emergency Department (HOSPITAL_COMMUNITY): Payer: Medicare Other

## 2011-10-08 ENCOUNTER — Encounter (HOSPITAL_COMMUNITY): Payer: Self-pay | Admitting: *Deleted

## 2011-10-08 ENCOUNTER — Emergency Department (HOSPITAL_COMMUNITY)
Admission: EM | Admit: 2011-10-08 | Discharge: 2011-10-08 | Disposition: A | Discharge status: Home / Self Care | Payer: Medicare Other | Attending: Emergency Medicine | Admitting: Emergency Medicine

## 2011-10-08 DIAGNOSIS — R10816 Epigastric abdominal tenderness: Secondary | ICD-10-CM | POA: Insufficient documentation

## 2011-10-08 DIAGNOSIS — R011 Cardiac murmur, unspecified: Secondary | ICD-10-CM | POA: Insufficient documentation

## 2011-10-08 DIAGNOSIS — R112 Nausea with vomiting, unspecified: Secondary | ICD-10-CM | POA: Insufficient documentation

## 2011-10-08 DIAGNOSIS — K5730 Diverticulosis of large intestine without perforation or abscess without bleeding: Secondary | ICD-10-CM | POA: Insufficient documentation

## 2011-10-08 DIAGNOSIS — R109 Unspecified abdominal pain: Secondary | ICD-10-CM

## 2011-10-08 DIAGNOSIS — K8020 Calculus of gallbladder without cholecystitis without obstruction: Secondary | ICD-10-CM | POA: Insufficient documentation

## 2011-10-08 DIAGNOSIS — I25810 Atherosclerosis of coronary artery bypass graft(s) without angina pectoris: Secondary | ICD-10-CM | POA: Insufficient documentation

## 2011-10-08 DIAGNOSIS — R609 Edema, unspecified: Secondary | ICD-10-CM | POA: Insufficient documentation

## 2011-10-08 LAB — COMPREHENSIVE METABOLIC PANEL
Alkaline Phosphatase: 55 U/L (ref 39–117)
BUN: 35 mg/dL — ABNORMAL HIGH (ref 6–23)
Calcium: 9.2 mg/dL (ref 8.4–10.5)
Creatinine, Ser: 1.21 mg/dL — ABNORMAL HIGH (ref 0.50–1.10)
GFR calc Af Amer: 51 mL/min — ABNORMAL LOW (ref >90)
Glucose, Bld: 142 mg/dL — ABNORMAL HIGH (ref 70–99)
Total Protein: 7.4 g/dL (ref 6.0–8.3)

## 2011-10-08 LAB — CBC
MCH: 29.5 pg (ref 26.0–34.0)
MCHC: 33.3 g/dL (ref 30.0–36.0)
Platelets: 268 10*3/uL (ref 150–400)

## 2011-10-08 LAB — DIFFERENTIAL
Basophils Absolute: 0 10*3/uL (ref 0.0–0.1)
Basophils Relative: 0 % (ref 0–1)
Eosinophils Absolute: 0.1 10*3/uL (ref 0.0–0.7)
Neutro Abs: 7.4 10*3/uL (ref 1.7–7.7)
Neutrophils Relative %: 71 % (ref 43–77)

## 2011-10-08 LAB — PROTIME-INR: Prothrombin Time: 13 seconds (ref 11.6–15.2)

## 2011-10-08 LAB — LIPASE, BLOOD: Lipase: 36 U/L (ref 11–59)

## 2011-10-08 LAB — APTT: aPTT: 33 seconds (ref 24–37)

## 2011-10-08 LAB — LACTIC ACID, PLASMA: Lactic Acid, Venous: 0.9 mmol/L (ref 0.5–2.2)

## 2011-10-08 MED ORDER — CIPROFLOXACIN HCL 500 MG PO TABS: 500.0000 mg | ORAL_TABLET | Freq: Two times a day (BID) | ORAL | Status: AC

## 2011-10-08 MED ORDER — OXYCODONE-ACETAMINOPHEN 5-325 MG PO TABS: 1.0000 | ORAL_TABLET | ORAL | Status: AC | PRN

## 2011-10-08 MED ORDER — IOHEXOL 300 MG/ML  SOLN
100.0000 mL | Freq: Once | INTRAMUSCULAR | Status: AC | PRN
  Administered 2011-10-08: 100 mL via INTRAVENOUS

## 2011-10-08 MED ORDER — METRONIDAZOLE 500 MG PO TABS
500.0000 mg | ORAL_TABLET | Freq: Once | ORAL | Status: AC
  Administered 2011-10-08: 500 mg via ORAL
  Filled 2011-10-08: qty 1

## 2011-10-08 MED ORDER — MORPHINE SULFATE 2 MG/ML IJ SOLN
2.0000 mg | Freq: Once | INTRAMUSCULAR | Status: AC
  Administered 2011-10-08: 2 mg via INTRAVENOUS
  Filled 2011-10-08: qty 1

## 2011-10-08 MED ORDER — SODIUM CHLORIDE 0.9 % IV SOLN
Freq: Once | INTRAVENOUS | Status: AC
  Administered 2011-10-08: 04:00:00 via INTRAVENOUS

## 2011-10-08 MED ORDER — PROMETHAZINE HCL 25 MG PO TABS: 25.0000 mg | ORAL_TABLET | Freq: Four times a day (QID) | ORAL | Status: DC | PRN

## 2011-10-08 MED ORDER — ASPIRIN 81 MG PO CHEW
324.0000 mg | CHEWABLE_TABLET | Freq: Once | ORAL | Status: DC
  Filled 2011-10-08: qty 4

## 2011-10-08 MED ORDER — MORPHINE SULFATE 4 MG/ML IJ SOLN: 2.0000 mg | Freq: Once | INTRAMUSCULAR | Status: DC

## 2011-10-08 MED ORDER — METRONIDAZOLE 500 MG PO TABS: 500.0000 mg | ORAL_TABLET | Freq: Two times a day (BID) | ORAL | Status: AC

## 2011-10-08 MED ORDER — SODIUM CHLORIDE 0.9 % IV BOLUS (SEPSIS)
500.0000 mL | Freq: Once | INTRAVENOUS | Status: AC
  Administered 2011-10-08: 500 mL via INTRAVENOUS

## 2011-10-08 MED ORDER — CIPROFLOXACIN IN D5W 400 MG/200ML IV SOLN
400.0000 mg | Freq: Once | INTRAVENOUS | Status: AC
  Administered 2011-10-08: 400 mg via INTRAVENOUS
  Filled 2011-10-08: qty 200

## 2011-10-08 MED ORDER — ONDANSETRON HCL 4 MG/2ML IJ SOLN
4.0000 mg | Freq: Once | INTRAMUSCULAR | Status: AC
Start: 2011-10-08 — End: 2011-10-08
  Administered 2011-10-08: 4 mg via INTRAVENOUS
  Filled 2011-10-08: qty 2

## 2011-10-08 MED ORDER — ONDANSETRON 4 MG PO TBDP: 4.0000 mg | ORAL_TABLET | Freq: Three times a day (TID) | ORAL | Status: AC | PRN

## 2011-10-08 NOTE — ED Notes (Signed)
Pt reporting feeling "just a little better" following vomiting and administration of Zofran.  Pt continues to sip on CT contrast as tolerated.

## 2011-10-08 NOTE — ED Notes (Signed)
gastocult performed, slight positive result. EDP shown device & agreed w/ testing.

## 2011-10-08 NOTE — ED Provider Notes (Signed)
History     CSN: [id number]  Arrival date & time 10/08/11  0309   First MD Initiated Contact with Patient 10/08/11 [phone]      Chief Complaint  Patient presents with  . Abdominal Pain    (Consider location/radiation/quality/duration/timing/severity/associated sxs/prior treatment) HPI Comments: 71 year old female with a history of coronary artery disease status post bypass grafting in 2002x5 vessels. She states that approximately 4 hours ago she developed an epigastric pain, a pressure sensation in her back as well as severe nausea. This has been persistent, severe, nothing makes this better or worse, mild associated bloating. She denies vomiting, dysuria, diarrhea, hematochezia, melena, rashes. 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  She denies shortness of breath, cough. She does have associated dizziness and lightheadedness. He describes her symptoms in a similar fashion to her symptoms that prompted the bypass grafting in 2002. At that time she had pain between her shoulder blades. She has had a similar pain in her back as well today though not as intense as at that time. She has had no medications prior to arrival.  Patient is a 71 y.o. female presenting with abdominal pain. The history is provided by the patient and medical records.  Abdominal Pain The primary symptoms of the illness include abdominal pain.    Past Medical History  Diagnosis Date  . Coronary artery disease     Past Surgical History  Procedure Date  . Cardiac surgery     History reviewed. No pertinent family history.  History  Substance Use Topics  . Smoking status: Never Smoker   . Smokeless tobacco: Not on file  . Alcohol Use: No    OB History    Grav Para Term Preterm Abortions TAB SAB Ect Mult Living                  Review of Systems  Gastrointestinal: Positive for abdominal pain.  All other systems reviewed and are negative.    Allergies    Codeine  Home Medications   Current Outpatient Rx  Name Route Sig Dispense Refill  . ACETAMINOPHEN 500 MG PO TABS Oral Take 1,000 mg by mouth every 6 (six) hours as needed. For pain    . ASPIRIN 325 MG PO TABS Oral Take 325 mg by mouth daily.    . COLESEVELAM HCL 625 MG PO TABS Oral Take 1,875 mg by mouth 2 (two) times daily with a meal.    . OMEGA-3 FATTY ACIDS 1000 MG PO CAPS Oral Take 2 g by mouth daily.    Marland Kitchen METOPROLOL SUCCINATE ER 25 MG PO TB24 Oral Take 25 mg by mouth 2 (two) times daily.    . ADULT MULTIVITAMIN W/MINERALS CH Oral Take 1 tablet by mouth daily.    . QUINAPRIL HCL 5 MG PO TABS Oral Take 5 mg by mouth daily.    . TRIAMTERENE-HCTZ 37.5-25 MG PO CAPS Oral Take 1 capsule by mouth daily.    Marland Kitchen CIPROFLOXACIN HCL 500 MG PO TABS Oral Take 1 tablet (500 mg total) by mouth every 12 (twelve) hours. 20 tablet 0  . METRONIDAZOLE 500 MG PO TABS Oral Take 1 tablet (500 mg total) by mouth 2 (two) times daily. 20 tablet 0  . ONDANSETRON 4 MG PO TBDP Oral Take 1 tablet (4 mg total) by mouth every 8 (eight) hours as needed for nausea. 10 tablet 0  . OXYCODONE-ACETAMINOPHEN 5-325 MG PO TABS Oral Take 1 tablet by mouth  every 4 (four) hours as needed for pain. May take 2 tablets PO q 6 hours for severe pain - Do not take with Tylenol as this tablet already contains tylenol 15 tablet 0  . PROMETHAZINE HCL 25 MG PO TABS Oral Take 1 tablet (25 mg total) by mouth every 6 (six) hours as needed for nausea. 12 tablet 0    BP 139/60  Pulse 73  Temp 98.5 F (36.9 C) (Oral)  Resp 20  Ht 5\' 2"  (1.575 m)  Wt 180 lb (81.647 kg)  BMI 32.92 kg/m2  SpO2 100%  Physical Exam  Nursing note and vitals reviewed. Constitutional: She appears well-developed and well-nourished.       Uncomfortable appearing  HENT:  Head: Normocephalic and atraumatic.  Mouth/Throat: Oropharynx is clear and moist. No oropharyngeal exudate.  Eyes: Conjunctivae and EOM are normal. Pupils are equal, round, and reactive to  light. Right eye exhibits no discharge. Left eye exhibits no discharge. No scleral icterus.  Neck: Normal range of motion. Neck supple. No JVD present. No thyromegaly present.  Cardiovascular: Normal rate, regular rhythm and intact distal pulses.  Exam reveals no gallop and no friction rub.   Murmur ( Systolic murmur) heard. Pulmonary/Chest: Effort normal and breath sounds normal. No respiratory distress. She has no wheezes. She has no rales.  Abdominal: Soft. Bowel sounds are normal. She exhibits no distension and no mass. There is tenderness.       Mild epigastric tenderness, patient states does not reproduce her pain. Non-peritoneal, nondistended, no tympanitic sounds  Musculoskeletal: Normal range of motion. She exhibits edema ( Left lower extremity 1+ edema greater than right lower extremity). She exhibits no tenderness.  Lymphadenopathy:    She has no cervical adenopathy.  Neurological: She is alert. Coordination normal.       Speech is clear  Skin: Skin is warm and dry. No rash noted. No erythema.  Psychiatric: She has a normal mood and affect. Her behavior is normal.    ED Course  Procedures (including critical care time)  Labs Reviewed  COMPREHENSIVE METABOLIC PANEL - Abnormal; Notable for the following:    Glucose, Bld 142 (*)     BUN 35 (*)     Creatinine, Ser 1.21 (*)     GFR calc non Af Amer 44 (*)     GFR calc Af Amer 51 (*)     All other components within normal limits  CBC  DIFFERENTIAL  APTT  PROTIME-INR  TROPONIN I  LACTIC ACID, PLASMA  LIPASE, BLOOD   Ct Abdomen Pelvis W Contrast  10/08/2011  *RADIOLOGY REPORT*  Clinical Data: Abdominal pain and bloating; nausea and vomiting.  CT ABDOMEN AND PELVIS WITH CONTRAST  Technique:  Multidetector CT imaging of the abdomen and pelvis was performed following the standard protocol during bolus administration of intravenous contrast.  Contrast: 169mL OMNIPAQUE IOHEXOL 300 MG/ML  SOLN  Comparison: CTA of the abdomen and  pelvis performed 04/05/2010  Findings: Minimal scarring is noted at the lung bases.  The patient is status post median sternotomy.  The liver and spleen are unremarkable in appearance.  A few small stones are seen layering dependently within the fundus of the gallbladder.  The gallbladder is otherwise unremarkable in appearance.  The pancreas and adrenal glands are unremarkable.  Minimal nonspecific perinephric stranding is noted bilaterally, slightly more prominent on the left.  The kidneys are unremarkable in appearance.  There is no evidence of hydronephrosis.  No renal or ureteral stones are seen.  Small bowel loops remain normal in caliber, though there is mild soft tissue inflammation noted about a significant amount of small bowel in the mid abdomen, with associated free fluid, best characterized on coronal images.  This raises concern for an underlying infectious or inflammatory process.  The stomach is within normal limits.  No acute vascular abnormalities are seen. Scattered calcification is noted along the abdominal aorta and its branches.  The appendix is normal in caliber, without evidence for appendicitis.  Diffuse diverticulosis is noted along the sigmoid colon, without definite evidence of diverticulitis, though there is suggestion of mild wall thickening along the mid sigmoid colon. The colon is otherwise unremarkable in appearance.  The bladder is mildly distended and grossly unremarkable in appearance.  The patient is status post hysterectomy.  No suspicious adnexal masses are seen.  No inguinal lymphadenopathy is seen.  No acute osseous abnormalities are identified.  IMPRESSION:  1.  Mild soft tissue inflammation noted about a significant amount of small bowel in the mid abdomen, with associated free fluid, best characterized on coronal images.  This raises concern for an underlying infectious or inflammatory process of the small bowel.  2.  Diffuse diverticulosis noted along the sigmoid colon.  Suggestion of mild wall thickening along the mid sigmoid colon, without definite evidence of diverticulitis. 3.  Cholelithiasis; gallbladder otherwise unremarkable in appearance.  Original Report Authenticated By: Santa Lighter, M.D.   Dg Chest Port 1 View  10/08/2011  *RADIOLOGY REPORT*  Clinical Data: Mid abdominal pain, nausea, bloating and vomiting.  PORTABLE CHEST - 1 VIEW  Comparison: Chest radiograph performed 06/28/2007  Findings: The lungs are well-aerated and clear.  There is no evidence of focal opacification, pleural effusion or pneumothorax.  The cardiomediastinal silhouette is normal in size; the patient is status post median sternotomy, with evidence of prior CABG.  No acute osseous abnormalities are seen.  IMPRESSION: No acute cardiopulmonary process seen.  Original Report Authenticated By: Santa Lighter, M.D.     1. Abdominal pain       MDM  Overall the patient is uncomfortable appearing though she does have normal vital signs at this time. We'll proceed with an EKG, laboratory workup, start with chest x-ray. Would consider both abdominal and cardiac etiologies to her symptoms.  ED ECG REPORT -  I have personally interpreted the ECG - see below   Date: 10/08/2011   Rate: 67  Rhythm: normal sinus rhythm  QRS Axis: normal  Intervals: normal  ST/T Wave abnormalities: normal  Conduction Disutrbances:none  Narrative Interpretation:   Old EKG Reviewed: c/w 10/02/06, no sig changes  No signs of ischemia on ECG  After initial evaluation, the pt developed a large amount of bilious emesis.  She has had a vaginal hysterectomy in the past - will r/o obstruction as well.  Gastroccult performed - mildly positive   After the patient initially vomited approximately 400 cc of bilious emesis she had significant improvement in her symptoms. She received a dose of pain medication, antiemetics as well as IV fluids and has improved significantly. On repeat abdominal exam she has no  tenderness to palpation, review of her laboratory study shows no leukocytosis and a CT scan was reviewed with the patient showing inflammatory changes around the bowel. There is no perforation, no abscess and without a leukocytosis or fever the patient appears stable for discharge. Prior to discharge the patient was given IV ciprofloxacin and oral Flagyl. She will complete a ten-day course of this medication followup with her  doctor in 2 days.  Discharge Prescriptions include:  Percocet Phenergan Zofran Cipro Flagyl   Johnna Acosta, MD 10/08/11 [phone]

## 2011-10-08 NOTE — ED Notes (Signed)
Pt reporting pain in mid-abdomen.  Reports taking antacid with no relief.  Pt states that pain woke her up at about 1130.  Reports bloating and burning with mild nausea.  No vomiting.

## 2011-10-08 NOTE — ED Notes (Signed)
Pt vomited app 500-600 ccs green/brown

## 2011-10-08 NOTE — Discharge Instructions (Signed)
A CT scan of your abdomen shows that you have some inflammatory changes around your small bowel. This likely is coming from an infection and should be treated with antibiotics. Please take ciprofloxacin twice a day, Flagyl twice a day for 10 days. Also take the pain medications as prescribed and call your doctor for a recheck within 48 hours. He should return for severe or worsening pain, vomiting or fevers. Plenty of fluids so as to avoid dehydration for the next several days.

## 2011-10-10 LAB — OCCULT BLOOD, POC DEVICE: Fecal Occult Bld: POSITIVE

## 2012-10-31 ENCOUNTER — Telehealth: Payer: Self-pay | Admitting: Cardiovascular Disease

## 2012-10-31 DIAGNOSIS — Z79899 Other long term (current) drug therapy: Secondary | ICD-10-CM

## 2012-10-31 DIAGNOSIS — E782 Mixed hyperlipidemia: Secondary | ICD-10-CM

## 2012-10-31 NOTE — Telephone Encounter (Signed)
Pt needs her lab slip mailed to her

## 2012-10-31 NOTE — Telephone Encounter (Signed)
Returned call.  Left message to call back tomorrow before 4pm.  Labs ordered and pt can present to any [facility] lab to have drawn.  Lab slip mailed.

## 2012-10-31 NOTE — Telephone Encounter (Signed)
Pt would like her lab slip mailed to her

## 2012-11-01 NOTE — Telephone Encounter (Signed)
Returned call.  Pt informed labs ordered and slip mailed.  Also informed she does not need the slip to present to the lab and she can go to any [place] lab.  Pt verbalized understanding and agreed w/ plan.

## 2012-11-06 LAB — CBC
Hemoglobin: 12.3 g/dL (ref 12.0–15.0)
MCHC: 33.5 g/dL (ref 30.0–36.0)
RBC: 4.3 MIL/uL (ref 3.87–5.11)
WBC: 6.6 10*3/uL (ref 4.0–10.5)

## 2012-11-06 LAB — COMPREHENSIVE METABOLIC PANEL
Albumin: 3.6 g/dL (ref 3.5–5.2)
Alkaline Phosphatase: 53 U/L (ref 39–117)
CO2: 27 mEq/L (ref 19–32)
Chloride: 104 mEq/L (ref 96–112)
Glucose, Bld: 88 mg/dL (ref 70–99)
Potassium: 4.4 mEq/L (ref 3.5–5.3)
Sodium: 139 mEq/L (ref 135–145)
Total Protein: 6.7 g/dL (ref 6.0–8.3)

## 2012-11-06 LAB — LIPID PANEL
LDL Cholesterol: 156 mg/dL — ABNORMAL HIGH (ref 0–99)
Triglycerides: 152 mg/dL — ABNORMAL HIGH (ref <150)

## 2012-11-08 ENCOUNTER — Telehealth: Payer: Self-pay | Admitting: *Deleted

## 2012-11-08 NOTE — Telephone Encounter (Signed)
Message copied by Tressa Busman on Thu Nov 08, 2012  7:41 AM ------      Message from: Sanda Klein      Created: Wed Nov 07, 2012  1:16 PM       LDL cholesterol still high despite Welchol. Other labs are OK. Unlikely to get better LDL without statins. ------

## 2012-11-08 NOTE — Telephone Encounter (Signed)
Lab results called to patient.  Instructed on a low cholesterol diet and exercise as able.  Pt. Voiced understanding.

## 2012-11-09 ENCOUNTER — Ambulatory Visit (INDEPENDENT_AMBULATORY_CARE_PROVIDER_SITE_OTHER): Payer: Medicare Other | Admitting: Cardiovascular Disease

## 2012-11-09 ENCOUNTER — Encounter: Payer: Self-pay | Admitting: Cardiovascular Disease

## 2012-11-09 VITALS — BP 130/76 | HR 59 | Resp 16 | Ht 62.5 in | Wt 183.0 lb

## 2012-11-09 DIAGNOSIS — E785 Hyperlipidemia, unspecified: Secondary | ICD-10-CM

## 2012-11-09 DIAGNOSIS — Z79899 Other long term (current) drug therapy: Secondary | ICD-10-CM

## 2012-11-09 DIAGNOSIS — I872 Venous insufficiency (chronic) (peripheral): Secondary | ICD-10-CM

## 2012-11-09 DIAGNOSIS — I2510 Atherosclerotic heart disease of native coronary artery without angina pectoris: Secondary | ICD-10-CM

## 2012-11-09 DIAGNOSIS — I10 Essential (primary) hypertension: Secondary | ICD-10-CM

## 2012-11-09 DIAGNOSIS — I739 Peripheral vascular disease, unspecified: Secondary | ICD-10-CM

## 2012-11-09 DIAGNOSIS — E669 Obesity, unspecified: Secondary | ICD-10-CM

## 2012-11-09 DIAGNOSIS — Z789 Other specified health status: Secondary | ICD-10-CM

## 2012-11-09 MED ORDER — EZETIMIBE 10 MG PO TABS: 10.0000 mg | ORAL_TABLET | Freq: Every day | ORAL | Status: DC

## 2012-11-09 NOTE — Patient Instructions (Signed)
Your physician recommends that you return for lab work in: 3 months  Your physician has recommended you make the following change in your medication: Start Zetia 10 mg once daily. Take the samples first and if you do not have side effects please fill the prescription that we are sending to your pharmacy today.  Your physician recommends that you schedule a follow-up appointment in: 6 months

## 2012-11-10 ENCOUNTER — Encounter: Payer: Self-pay | Admitting: Cardiovascular Disease

## 2012-11-10 DIAGNOSIS — Z789 Other specified health status: Secondary | ICD-10-CM | POA: Insufficient documentation

## 2012-11-10 DIAGNOSIS — I872 Venous insufficiency (chronic) (peripheral): Secondary | ICD-10-CM | POA: Insufficient documentation

## 2012-11-10 DIAGNOSIS — I739 Peripheral vascular disease, unspecified: Secondary | ICD-10-CM | POA: Insufficient documentation

## 2012-11-10 DIAGNOSIS — I10 Essential (primary) hypertension: Secondary | ICD-10-CM | POA: Insufficient documentation

## 2012-11-10 DIAGNOSIS — I2510 Atherosclerotic heart disease of native coronary artery without angina pectoris: Secondary | ICD-10-CM | POA: Insufficient documentation

## 2012-11-10 DIAGNOSIS — E785 Hyperlipidemia, unspecified: Secondary | ICD-10-CM | POA: Insufficient documentation

## 2012-11-10 DIAGNOSIS — E669 Obesity, unspecified: Secondary | ICD-10-CM | POA: Insufficient documentation

## 2012-11-10 NOTE — Assessment & Plan Note (Signed)
Adequate control with ACE inhibitor and diuretic and beta blocker therapy

## 2012-11-10 NOTE — Assessment & Plan Note (Signed)
She has moderate plaque in both carotid and lower tremor arterial beds, without any stenoses of clinical significance by Doppler studies performed in June of last year. She has not had stroke, TIA and denies intermittent

## 2012-11-10 NOTE — Assessment & Plan Note (Signed)
Continued efforts at weight loss are encouraged

## 2012-11-10 NOTE — Assessment & Plan Note (Signed)
She has clear evidence of varicose veins and bilateral venous insufficiency with chronic trophic changes and discoloration and edema. The findings are particularly prominent in the left lower extremity which is the site of her saphenectomy. There is a small 2-3 mm crusted lesion above her medial malleolus which is highly worrisome for a venous stasis ulcer. She is encouraged to keep her left leg elevated at all times when she is sitting and to wear compression stockings when she is upright. She is to report any break in the skin or signs of possible infection immediately. She might require referral for wound care if this happens.

## 2012-11-10 NOTE — Progress Notes (Signed)
Patient ID: Sandra Bradford, female   DOB: [birth year], 72 y.o.   MRN: [id number]      Reason for office visit Sandra Bradford is now 72 years old and is roughly 12 years status post 5 vessel bypass surgery but has not had any coronary event since that time. She has well treated hypertension and does not have diabetes mellitus but unfortunately has severe hypercholesterolemia and is intolerant of multiple statins that have been used in the past. These closed both myalgia and confusion. She's currently on lipid-lowering therapy with a combination of WelChol omega-3 fatty acid supplements. She has made improvements in her diet and has become much more physically active. Nevertheless her lipid profile is still not in the desirable range.  She walks 2-1/2 miles a day on a soccer field most days of the week. At times she walks on a trail that involves going up he'll and at that time can only manage about a mile. Nevertheless her exercise tolerance has been steadily improving. She denies intermittent claudication but has prominent lower showed edema. She does not have shortness of breath except when she has to climb the hill. She denies any chest pain. Not had any focal neurological problems.   Allergies  Allergen Reactions  . Codeine Nausea Only  . Statins     MYALGIA CONFUSION    Current Outpatient Prescriptions  Medication Sig Dispense Refill  . acetaminophen (TYLENOL) 500 MG tablet Take 1,000 mg by mouth every 6 (six) hours as needed. For pain      . aspirin 325 MG tablet Take 325 mg by mouth daily.      . colesevelam (WELCHOL) 625 MG tablet Take 1,875 mg by mouth 2 (two) times daily with a meal.      . fish oil-omega-3 fatty acids 1000 MG capsule Take 2 g by mouth daily.      . metoprolol succinate (TOPROL-XL) 25 MG 24 hr tablet Take 25 mg by mouth 2 (two) times daily.      . quinapril (ACCUPRIL) 5 MG tablet Take 5 mg by mouth daily.      Marland Kitchen triamterene-hydrochlorothiazide (DYAZIDE) 37.5-25 MG per  capsule Take 1 capsule by mouth daily.      Marland Kitchen ezetimibe (ZETIA) 10 MG tablet Take 1 tablet (10 mg total) by mouth daily.  90 tablet  3  . Multiple Vitamin (MULITIVITAMIN WITH MINERALS) TABS Take 1 tablet by mouth daily.      . promethazine (PHENERGAN) 25 MG tablet Take 1 tablet (25 mg total) by mouth every 6 (six) hours as needed for nausea.  12 tablet  0   No current facility-administered medications for this visit.    Past Medical History  Diagnosis Date  . Coronary artery disease   . Hyperlipemia   . Systemic hypertension   . Carotid arterial disease     Past Surgical History  Procedure Laterality Date  . Cardiac surgery  01/2001    LIMA TO LAD,SVG TO DIAGONAL,SVG TO MARGINAL,SVG SEQUENTALLY TO THE PD & PL VESSEL  . Tonsillectomy  1953  . Tubal ligation    . Partial hysterectomy    . Knee surgery      RIGHT    Family History  Problem Relation Age of Onset  . Heart attack Mother   . Heart attack Father     History   Social History  . Marital Status: Divorced    Spouse Name: N/A    Number of Children: N/A  . Years of  Education: N/A   Occupational History  . Not on file.   Social History Main Topics  . Smoking status: Never Smoker   . Smokeless tobacco: Not on file  . Alcohol Use: Yes     Comment: OCCAS  . Drug Use: No  . Sexually Active: Not on file   Other Topics Concern  . Not on file   Social History Narrative  . No narrative on file    Review of systems: The patient specifically denies any chest pain at rest or with exertion, dyspnea at rest, orthopnea, paroxysmal nocturnal dyspnea, syncope, palpitations, focal neurological deficits, intermittent claudication, unexplained weight gain, cough, hemoptysis or wheezing.  The patient also denies abdominal pain, nausea, vomiting, dysphagia, diarrhea, constipation, polyuria, polydipsia, dysuria, hematuria, frequency, urgency, abnormal bleeding or bruising, fever, chills, unexpected weight changes, mood  swings, change in skin or hair texture, change in voice quality, auditory or visual problems, allergic reactions or rashes, new musculoskeletal complaints other than usual "aches and pains".   PHYSICAL EXAM BP 130/76  Pulse 59  Resp 16  Ht 5' 2.5" (1.588 m)  Wt 183 lb (83.008 kg)  BMI 32.92 kg/m2  General: Alert, oriented x3, no distress Head: no evidence of trauma, PERRL, EOMI, no exophtalmos or lid lag, no myxedema, no xanthelasma; normal ears, nose and oropharynx Neck: normal jugular venous pulsations and no hepatojugular reflux; brisk carotid pulses without delay and no carotid bruits Chest: clear to auscultation, no signs of consolidation by percussion or palpation, normal fremitus, symmetrical and full respiratory excursions; healed sternotomy scar Cardiovascular: normal position and quality of the apical impulse, regular rhythm, normal first and second heart sounds, no murmurs, rubs or gallops Abdomen: no tenderness or distention, no masses by palpation, no abnormal pulsatility or arterial bruits, normal bowel sounds, no hepatosplenomegaly Extremities: no clubbing, cyanosis; there is 1+ ankle edema on the right and 2+ ankle and calf edema on the left where she has saphenectomy scars; there is an approximately 2 mm crusted lesion above the medial malleolus; there is bilateral discoloration and skin thickening consistent with chronic atrophic changes; 2+ radial, ulnar and brachial pulses bilaterally; 2+ right femoral, posterior tibial and dorsalis pedis pulses; 2+ left femoral, posterior tibial and dorsalis pedis pulses; no subclavian or femoral bruits Neurological: grossly nonfocal   EKG:  Sinus rhythm, normal tracing  Lipid Panel     Component Value Date/Time   CHOL 236* 11/06/2012 0845   TRIG 152* 11/06/2012 0845   HDL 50 11/06/2012 0845   CHOLHDL 4.7 11/06/2012 0845   VLDL 30 11/06/2012 0845   LDLCALC 156* 11/06/2012 0845    BMET    Component Value Date/Time   NA 139 11/06/2012  0845   K 4.4 11/06/2012 0845   CL 104 11/06/2012 0845   CO2 27 11/06/2012 0845   GLUCOSE 88 11/06/2012 0845   BUN 24* 11/06/2012 0845   CREATININE 1.34* 11/06/2012 0845   CREATININE 1.21* 10/08/2011 0344   CALCIUM 9.1 11/06/2012 0845   GFRNONAA 44* 10/08/2011 0344   GFRAA 51* 10/08/2011 0344     ASSESSMENT AND PLAN CAD (coronary artery disease) It has now been 12 years since bypass surgery and she has not had any recurrent coronary problems. She denies angina at rest or with exertion. She has normal left ventricle systolic function. Her most recent functional study was performed in 2010 and showed no evidence of ischemia. Risk factor modification is the most important intervention at this time to  PAD (peripheral artery disease) She  has moderate plaque in both carotid and lower tremor arterial beds, without any stenoses of clinical significance by Doppler studies performed in June of last year. She has not had stroke, TIA and denies intermittent  Hyperlipidemia Unfortunately she is intolerance to multiple statins. She's currently been managed with a combination of WelChol, omega-3 fatty acid supplements and diet. Her most recent lipid profile showed values of LDL cholesterol that while improved from baseline remained well above the target range. She has made some progress with increasing physical exercise and weight loss. His unlikely will ever achieve target ages for LDL cholesterol without a statin at least not until the new agents become available in the next few years.  Statin intolerance    HTN (hypertension) Adequate control with ACE inhibitor and diuretic and beta blocker therapy  Obesity (BMI 30.0-34.9) Continued efforts at weight loss are encouraged  Venous insufficiency, peripheral She has clear evidence of varicose veins and bilateral venous insufficiency with chronic trophic changes and discoloration and edema. The findings are particularly prominent in the left lower extremity  which is the site of her saphenectomy. There is a small 2-3 mm crusted lesion above her medial malleolus which is highly worrisome for a venous stasis ulcer. She is encouraged to keep her left leg elevated at all times when she is sitting and to wear compression stockings when she is upright. She is to report any break in the skin or signs of possible infection immediately. She might require referral for wound care if this happens.  Orders Placed This Encounter  Procedures  . Lipid Profile  . Comp Met (CMET)  . EKG 12-Lead   Meds ordered this encounter  Medications  . ezetimibe (ZETIA) 10 MG tablet    Sig: Take 1 tablet (10 mg total) by mouth daily.    Dispense:  90 tablet    Refill:  [place]roy[place] Tylee Yum, MD, [place] [phone] office [phone] pager

## 2012-11-10 NOTE — Assessment & Plan Note (Signed)
It has now been 12 years since bypass surgery and she has not had any recurrent coronary problems. She denies angina at rest or with exertion. She has normal left ventricle systolic function. Her most recent functional study was performed in 2010 and showed no evidence of ischemia. Risk factor modification is the most important intervention at this time to

## 2012-11-10 NOTE — Assessment & Plan Note (Signed)
Unfortunately she is intolerance to multiple statins. She's currently been managed with a combination of WelChol, omega-3 fatty acid supplements and diet. Her most recent lipid profile showed values of LDL cholesterol that while improved from baseline remained well above the target range. She has made some progress with increasing physical exercise and weight loss. His unlikely will ever achieve target ages for LDL cholesterol without a statin at least not until the new agents become available in the next few years.

## 2013-01-14 ENCOUNTER — Inpatient Hospital Stay (HOSPITAL_COMMUNITY)
Admission: EM | Admit: 2013-01-14 | Discharge: 2013-01-18 | DRG: 419 | Disposition: A | Discharge status: Home / Self Care | Payer: Medicare Other | Attending: General Surgery | Admitting: General Surgery

## 2013-01-14 ENCOUNTER — Inpatient Hospital Stay (HOSPITAL_COMMUNITY): Payer: Medicare Other

## 2013-01-14 ENCOUNTER — Encounter (HOSPITAL_COMMUNITY): Payer: Self-pay | Admitting: *Deleted

## 2013-01-14 ENCOUNTER — Emergency Department (HOSPITAL_COMMUNITY): Payer: Medicare Other

## 2013-01-14 DIAGNOSIS — E785 Hyperlipidemia, unspecified: Secondary | ICD-10-CM

## 2013-01-14 DIAGNOSIS — I10 Essential (primary) hypertension: Secondary | ICD-10-CM | POA: Diagnosis present

## 2013-01-14 DIAGNOSIS — K8000 Calculus of gallbladder with acute cholecystitis without obstruction: Principal | ICD-10-CM | POA: Diagnosis present

## 2013-01-14 DIAGNOSIS — Z789 Other specified health status: Secondary | ICD-10-CM

## 2013-01-14 DIAGNOSIS — Z01810 Encounter for preprocedural cardiovascular examination: Secondary | ICD-10-CM

## 2013-01-14 DIAGNOSIS — I739 Peripheral vascular disease, unspecified: Secondary | ICD-10-CM

## 2013-01-14 DIAGNOSIS — I2510 Atherosclerotic heart disease of native coronary artery without angina pectoris: Secondary | ICD-10-CM | POA: Diagnosis present

## 2013-01-14 DIAGNOSIS — E7800 Pure hypercholesterolemia, unspecified: Secondary | ICD-10-CM | POA: Diagnosis present

## 2013-01-14 DIAGNOSIS — Z951 Presence of aortocoronary bypass graft: Secondary | ICD-10-CM

## 2013-01-14 DIAGNOSIS — K8020 Calculus of gallbladder without cholecystitis without obstruction: Secondary | ICD-10-CM

## 2013-01-14 LAB — CBC WITH DIFFERENTIAL/PLATELET
Basophils Relative: 0 % (ref 0–1)
HCT: 43.7 % (ref 36.0–46.0)
Hemoglobin: 14.3 g/dL (ref 12.0–15.0)
Lymphocytes Relative: 30 % (ref 12–46)
MCHC: 32.7 g/dL (ref 30.0–36.0)
Monocytes Absolute: 0.3 10*3/uL (ref 0.1–1.0)
Monocytes Relative: 4 % (ref 3–12)
Neutro Abs: 4.4 10*3/uL (ref 1.7–7.7)

## 2013-01-14 LAB — BASIC METABOLIC PANEL
BUN: 25 mg/dL — ABNORMAL HIGH (ref 6–23)
CO2: 24 mEq/L (ref 19–32)
Chloride: 100 mEq/L (ref 96–112)
Creatinine, Ser: 1.31 mg/dL — ABNORMAL HIGH (ref 0.50–1.10)
Glucose, Bld: 125 mg/dL — ABNORMAL HIGH (ref 70–99)

## 2013-01-14 LAB — HEPATIC FUNCTION PANEL
ALT: 14 U/L (ref 0–35)
AST: 22 U/L (ref 0–37)
Bilirubin, Direct: 0.2 mg/dL (ref 0.0–0.3)
Total Bilirubin: 0.6 mg/dL (ref 0.3–1.2)

## 2013-01-14 LAB — URINALYSIS, ROUTINE W REFLEX MICROSCOPIC
Glucose, UA: NEGATIVE mg/dL
Hgb urine dipstick: NEGATIVE
Ketones, ur: NEGATIVE mg/dL
Protein, ur: NEGATIVE mg/dL

## 2013-01-14 MED ORDER — IOHEXOL 300 MG/ML  SOLN
80.0000 mL | Freq: Once | INTRAMUSCULAR | Status: AC | PRN
  Administered 2013-01-14: 80 mL via INTRAVENOUS

## 2013-01-14 MED ORDER — ONDANSETRON HCL 4 MG/2ML IJ SOLN
4.0000 mg | Freq: Four times a day (QID) | INTRAMUSCULAR | Status: DC | PRN
  Administered 2013-01-17: 4 mg via INTRAVENOUS
  Filled 2013-01-14 (×2): qty 2

## 2013-01-14 MED ORDER — HYDROMORPHONE HCL PF 1 MG/ML IJ SOLN
0.5000 mg | Freq: Once | INTRAMUSCULAR | Status: AC
  Administered 2013-01-14: 0.5 mg via INTRAVENOUS
  Filled 2013-01-14: qty 1

## 2013-01-14 MED ORDER — PANTOPRAZOLE SODIUM 40 MG IV SOLR
40.0000 mg | Freq: Every day | INTRAVENOUS | Status: DC
  Administered 2013-01-14 – 2013-01-17 (×4): 40 mg via INTRAVENOUS
  Filled 2013-01-14 (×3): qty 40

## 2013-01-14 MED ORDER — ENOXAPARIN SODIUM 40 MG/0.4ML ~~LOC~~ SOLN
40.0000 mg | SUBCUTANEOUS | Status: DC
  Administered 2013-01-14 – 2013-01-17 (×4): 40 mg via SUBCUTANEOUS
  Filled 2013-01-14 (×4): qty 0.4

## 2013-01-14 MED ORDER — ONDANSETRON HCL 4 MG/2ML IJ SOLN
4.0000 mg | Freq: Once | INTRAMUSCULAR | Status: AC
  Administered 2013-01-14: 4 mg via INTRAVENOUS
  Filled 2013-01-14: qty 2

## 2013-01-14 MED ORDER — IOHEXOL 300 MG/ML  SOLN
50.0000 mL | Freq: Once | INTRAMUSCULAR | Status: AC | PRN
  Administered 2013-01-14: 50 mL via ORAL

## 2013-01-14 MED ORDER — DEXTROSE 5 % IV SOLN
1.0000 g | Freq: Four times a day (QID) | INTRAVENOUS | Status: DC
  Administered 2013-01-14 – 2013-01-16 (×7): 1 g via INTRAVENOUS
  Filled 2013-01-14 (×12): qty 1

## 2013-01-14 MED ORDER — MORPHINE SULFATE 2 MG/ML IJ SOLN: 1.0000 mg | INTRAMUSCULAR | Status: DC | PRN

## 2013-01-14 MED ORDER — LACTATED RINGERS IV SOLN
INTRAVENOUS | Status: DC
  Administered 2013-01-14 – 2013-01-17 (×5): via INTRAVENOUS
  Administered 2013-01-17: 1000 mL via INTRAVENOUS

## 2013-01-14 NOTE — ED Provider Notes (Signed)
Medical screening examination/treatment/procedure(s) were conducted as a shared visit with non-physician practitioner(s) and myself.  I personally evaluated the patient during the encounter  Discussed with general surgery. They will mid for prolonged biliary colic they will do the admitting orders. Patient's labs without sniffing abnormalities but she is still tender in the right upper quadrant and still having subjective pain pains and ongoing now for 12 hours. CT does show gallstones no evidence of acute cholecystitis.  Mervin Kung, MD 01/14/13 1535

## 2013-01-14 NOTE — ED Provider Notes (Signed)
CSN: [id number]     Arrival date & time 01/14/13  0906 History   First MD Initiated Contact with Patient 01/14/13 0915     Chief Complaint  Patient presents with  . Abdominal Pain   (Consider location/radiation/quality/duration/timing/severity/associated sxs/prior Treatment) Patient is a 72 y.o. female presenting with abdominal pain. The history is provided by the patient.  Abdominal Pain Pain location:  RUQ, epigastric and periumbilical Pain quality: sharp   Pain radiates to:  Back Pain severity:  Severe Onset quality:  Gradual Timing:  Constant Progression:  Worsening Chronicity:  New Context: awakening from sleep   Context: not alcohol use and not suspicious food intake   Relieved by:  Nothing Worsened by:  Nothing tried Ineffective treatments:  None tried Associated symptoms: chest pain   Associated symptoms: no chills, no constipation, no cough, no dysuria, no fever, no hematemesis, no hematochezia, no hematuria, no melena, no shortness of breath, no sore throat and no vomiting   Risk factors: no alcohol abuse     Past Medical History  Diagnosis Date  . Coronary artery disease   . Hyperlipemia   . Systemic hypertension   . Carotid arterial disease    Past Surgical History  Procedure Laterality Date  . Tonsillectomy  1953  . Tubal ligation    . Partial hysterectomy    . Knee surgery      RIGHT  . Cardiac surgery  01/2001    LIMA TO LAD,SVG TO DIAGONAL,SVG TO MARGINAL,SVG SEQUENTALLY TO THE PD & PL VESSEL   Family History  Problem Relation Age of Onset  . Heart attack Mother   . Heart attack Father    History  Substance Use Topics  . Smoking status: Never Smoker   . Smokeless tobacco: Not on file  . Alcohol Use: Yes     Comment: OCCAS   OB History   Grav Para Term Preterm Abortions TAB SAB Ect Mult Living                 Review of Systems  Constitutional: Negative for fever, chills and activity change.       All ROS Neg except as noted in HPI  HENT:  Negative for nosebleeds, sore throat and neck pain.   Eyes: Negative for photophobia and discharge.  Respiratory: Negative for cough, shortness of breath and wheezing.   Cardiovascular: Positive for chest pain. Negative for palpitations.  Gastrointestinal: Positive for abdominal pain. Negative for vomiting, constipation, blood in stool, melena, hematochezia and hematemesis.  Genitourinary: Negative for dysuria, frequency and hematuria.  Musculoskeletal: Negative for back pain and arthralgias.  Skin: Negative.   Neurological: Negative for dizziness, seizures and speech difficulty.  Psychiatric/Behavioral: Negative for hallucinations and confusion.    Allergies  Codeine and Statins  Home Medications   Current Outpatient Rx  Name  Route  Sig  Dispense  Refill  . acetaminophen (TYLENOL) 500 MG tablet   Oral   Take 1,000 mg by mouth every 6 (six) hours as needed. For pain         . aspirin 325 MG tablet   Oral   Take 325 mg by mouth daily.         . colesevelam (WELCHOL) 625 MG tablet   Oral   Take 1,875 mg by mouth 2 (two) times daily with a meal.         . ezetimibe (ZETIA) 10 MG tablet   Oral   Take 1 tablet (10 mg total) by mouth daily.  90 tablet   3   . fish oil-omega-3 fatty acids 1000 MG capsule   Oral   Take 2 g by mouth daily.         . metoprolol succinate (TOPROL-XL) 25 MG 24 hr tablet   Oral   Take 25 mg by mouth 2 (two) times daily.         . Multiple Vitamin (MULITIVITAMIN WITH MINERALS) TABS   Oral   Take 1 tablet by mouth daily.         Marland Kitchen EXPIRED: promethazine (PHENERGAN) 25 MG tablet   Oral   Take 1 tablet (25 mg total) by mouth every 6 (six) hours as needed for nausea.   12 tablet   0   . quinapril (ACCUPRIL) 5 MG tablet   Oral   Take 5 mg by mouth daily.         Marland Kitchen triamterene-hydrochlorothiazide (DYAZIDE) 37.5-25 MG per capsule   Oral   Take 1 capsule by mouth daily.          BP 178/68  Pulse 74  Temp(Src) 97.5 F (36.4  C) (Oral)  Resp 16  Ht 5\' 2"  (1.575 m)  Wt 180 lb (81.647 kg)  BMI 32.91 kg/m2 Physical Exam  Nursing note and vitals reviewed. Constitutional: She is oriented to person, place, and time. She appears well-developed and well-nourished.  Non-toxic appearance.  HENT:  Head: Normocephalic.  Right Ear: Tympanic membrane and external ear normal.  Left Ear: Tympanic membrane and external ear normal.  Eyes: EOM and lids are normal. Pupils are equal, round, and reactive to light.  Neck: Normal range of motion. Neck supple. Carotid bruit is not present.  Cardiovascular: Normal rate, regular rhythm, intact distal pulses and normal pulses.   Murmur heard.  Systolic murmur is present with a grade of 2/6   Diastolic murmur is present  Occasional skip beat present.  Pulmonary/Chest: Breath sounds normal. No respiratory distress. She has no wheezes.  Abdominal: Soft. Bowel sounds are normal. There is no tenderness. There is no guarding.    Abd soft with good bowel sounds. No pulsating mass. No organomegally. Right upper and epigastric pain to palpation. Mild periumbilical pain. Mild right CVAT.   Musculoskeletal: Normal range of motion.  Mid and lower back  area tenderness with change of position.  Lymphadenopathy:       Head (right side): No submandibular adenopathy present.       Head (left side): No submandibular adenopathy present.    She has no cervical adenopathy.  Neurological: She is alert and oriented to person, place, and time. She has normal strength. No cranial nerve deficit or sensory deficit.  Skin: Skin is warm and dry.  Psychiatric: She has a normal mood and affect. Her speech is normal.    ED Course  Procedures (including critical care time) Labs Review Labs Reviewed - No data to display Imaging Review No results found.  Date: 01/14/2013  Rate: **76*  Rhythm: normal sinus rhythm  QRS Axis: normal  Intervals: normal  ST/T Wave abnormalities: normal  Conduction  Disutrbances:none  Narrative Interpretation: No change from Jun. 15, 2013. NO STEMI.  Old EKG Reviewed: unchanged  MDM  No diagnosis found. **I have reviewed nursing notes, vital signs, and all appropriate lab and imaging results for this patient.*  PAIN and nausea  improved with IV pain meds. UA wnl. CBC non-acute. bmet reveals BUN elevated at 25, Creat 1.31 elevated. Lipase 49 wnl. Trop I neg for MI. Pt  waiting for CT scan and results. Pt's care to be continued by Dr Rogene Houston.. 11:35am.  Lenox Ahr, PA-C 01/14/13 1629

## 2013-01-14 NOTE — ED Notes (Signed)
Sudden onset upper mid abdominal pain radiating to lower abdomen.  No n/v/d.  LNBM this morning.

## 2013-01-14 NOTE — ED Notes (Signed)
Pt reports increased nausea and "soreness."  Requesting nausea meds at this time.

## 2013-01-15 DIAGNOSIS — K8020 Calculus of gallbladder without cholecystitis without obstruction: Secondary | ICD-10-CM

## 2013-01-15 DIAGNOSIS — I10 Essential (primary) hypertension: Secondary | ICD-10-CM

## 2013-01-15 DIAGNOSIS — I2510 Atherosclerotic heart disease of native coronary artery without angina pectoris: Secondary | ICD-10-CM

## 2013-01-15 DIAGNOSIS — I739 Peripheral vascular disease, unspecified: Secondary | ICD-10-CM

## 2013-01-15 DIAGNOSIS — E785 Hyperlipidemia, unspecified: Secondary | ICD-10-CM

## 2013-01-15 DIAGNOSIS — Z01810 Encounter for preprocedural cardiovascular examination: Secondary | ICD-10-CM

## 2013-01-15 LAB — COMPREHENSIVE METABOLIC PANEL
ALT: 10 U/L (ref 0–35)
AST: 18 U/L (ref 0–37)
Alkaline Phosphatase: 54 U/L (ref 39–117)
Calcium: 9.6 mg/dL (ref 8.4–10.5)
Chloride: 101 mEq/L (ref 96–112)
Creatinine, Ser: 1.36 mg/dL — ABNORMAL HIGH (ref 0.50–1.10)
Glucose, Bld: 98 mg/dL (ref 70–99)
Potassium: 3.9 mEq/L (ref 3.5–5.1)
Sodium: 139 mEq/L (ref 135–145)
Total Protein: 6.6 g/dL (ref 6.0–8.3)

## 2013-01-15 LAB — CBC
HCT: 36.5 % (ref 36.0–46.0)
Hemoglobin: 12 g/dL (ref 12.0–15.0)
MCH: 29.5 pg (ref 26.0–34.0)
MCHC: 32.9 g/dL (ref 30.0–36.0)
MCV: 89.7 fL (ref 78.0–100.0)
RDW: 13.9 % (ref 11.5–15.5)
WBC: 6.2 10*3/uL (ref 4.0–10.5)

## 2013-01-15 MED ORDER — ACETAMINOPHEN 325 MG PO TABS
650.0000 mg | ORAL_TABLET | ORAL | Status: DC | PRN
  Administered 2013-01-15 – 2013-01-17 (×3): 650 mg via ORAL
  Filled 2013-01-15 (×3): qty 2

## 2013-01-15 MED ORDER — METOPROLOL TARTRATE 1 MG/ML IV SOLN
5.0000 mg | Freq: Four times a day (QID) | INTRAVENOUS | Status: DC
  Administered 2013-01-15 – 2013-01-17 (×4): 5 mg via INTRAVENOUS
  Filled 2013-01-15 (×5): qty 5

## 2013-01-15 NOTE — H&P (Signed)
Sandra Bradford is an 72 y.o. female.   Chief Complaint: Epigastric and right upper quadrant abdominal pain HPI: Patient presented to [facility] emergency department with several days of increasing right upper quadrant abdominal pain. She describes the pain as colicky. There is no significant radiation. It is exacerbated by palpation. Her appetite has been diminished. She denies any history of jaundice. She has had a history of indigestion. Positive bloating of fatty greasy foods. No change in bowel movements. No melena or hematochezia. No unusual contacts or travel. She has had some subjective fevers and chills. She has had associated nausea which is currently improved. She does have a significant history of coronary artery disease. She's last been seen by [facility] cardiology approximately 3 months ago. No new changes have been implemented.  Past Medical History  Diagnosis Date  . Coronary artery disease   . Hyperlipemia   . Systemic hypertension   . Carotid arterial disease     Past Surgical History  Procedure Laterality Date  . Tonsillectomy  1953  . Tubal ligation    . Partial hysterectomy    . Knee surgery      RIGHT  . Cardiac surgery  01/2001    LIMA TO LAD,SVG TO DIAGONAL,SVG TO MARGINAL,SVG SEQUENTALLY TO THE PD & PL VESSEL    Family History  Problem Relation Age of Onset  . Heart attack Mother   . Heart attack Father   . Congestive Heart Failure Father    Social History:  reports that she has never smoked. She does not have any smokeless tobacco history on file. She reports that  drinks alcohol. She reports that she does not use illicit drugs.  Allergies:  Allergies  Allergen Reactions  . Codeine Nausea Only  . Statins     MYALGIA CONFUSION    Medications Prior to Admission  Medication Sig Dispense Refill  . aspirin 325 MG tablet Take 325 mg by mouth daily.      Marland Kitchen ezetimibe (ZETIA) 10 MG tablet Take 1 tablet (10 mg total) by mouth daily.  90 tablet  3  .  Flaxseed, Linseed, (FLAX SEED OIL) 1000 MG CAPS Take 1,000 mg by mouth 2 (two) times daily.      . metoprolol tartrate (LOPRESSOR) 25 MG tablet Take 25 mg by mouth 2 (two) times daily.      . Multiple Vitamin (MULITIVITAMIN WITH MINERALS) TABS Take 1 tablet by mouth daily.      . quinapril (ACCUPRIL) 5 MG tablet Take 5 mg by mouth daily.      Marland Kitchen triamterene-hydrochlorothiazide (DYAZIDE) 37.5-25 MG per capsule Take 1 capsule by mouth daily.      Marland Kitchen acetaminophen (TYLENOL) 500 MG tablet Take 1,000 mg by mouth every 6 (six) hours as needed. For pain      . promethazine (PHENERGAN) 25 MG tablet Take 1 tablet (25 mg total) by mouth every 6 (six) hours as needed for nausea.  12 tablet  0    Results for orders placed during the hospital encounter of 01/14/13 (from the past 48 hour(s))  CBC WITH DIFFERENTIAL     Status: None   Collection Time    01/14/13 10:06 AM      Result Value Range   WBC 6.7  4.0 - 10.5 K/uL   RBC 4.94  3.87 - 5.11 MIL/uL   Hemoglobin 14.3  12.0 - 15.0 g/dL   HCT 43.7  36.0 - 46.0 %   MCV 88.5  78.0 - 100.0 fL  MCH 28.9  26.0 - 34.0 pg   MCHC 32.7  30.0 - 36.0 g/dL   RDW 13.9  11.5 - 15.5 %   Platelets 280  150 - 400 K/uL   Neutrophils Relative % 65  43 - 77 %   Neutro Abs 4.4  1.7 - 7.7 K/uL   Lymphocytes Relative 30  12 - 46 %   Lymphs Abs 2.0  0.7 - 4.0 K/uL   Monocytes Relative 4  3 - 12 %   Monocytes Absolute 0.3  0.1 - 1.0 K/uL   Eosinophils Relative 1  0 - 5 %   Eosinophils Absolute 0.0  0.0 - 0.7 K/uL   Basophils Relative 0  0 - 1 %   Basophils Absolute 0.0  0.0 - 0.1 K/uL  BASIC METABOLIC PANEL     Status: Abnormal   Collection Time    01/14/13 10:06 AM      Result Value Range   Sodium 138  135 - 145 mEq/L   Potassium 3.7  3.5 - 5.1 mEq/L   Chloride 100  96 - 112 mEq/L   CO2 24  19 - 32 mEq/L   Glucose, Bld 125 (*) 70 - 99 mg/dL   BUN 25 (*) 6 - 23 mg/dL   Creatinine, Ser 1.31 (*) 0.50 - 1.10 mg/dL   Calcium 11.1 (*) 8.4 - 10.5 mg/dL   GFR calc non  Af Amer 39 (*) >90 mL/min   GFR calc Af Amer 45 (*) >90 mL/min   Comment: (NOTE)     The eGFR has been calculated using the CKD EPI equation.     This calculation has not been validated in all clinical situations.     eGFR's persistently <90 mL/min signify possible Chronic Kidney     Disease.  LIPASE, BLOOD     Status: None   Collection Time    01/14/13 10:06 AM      Result Value Range   Lipase 49  11 - 59 U/L  HEPATIC FUNCTION PANEL     Status: Abnormal   Collection Time    01/14/13 10:06 AM      Result Value Range   Total Protein 8.6 (*) 6.0 - 8.3 g/dL   Albumin 4.3  3.5 - 5.2 g/dL   AST 22  0 - 37 U/L   ALT 14  0 - 35 U/L   Alkaline Phosphatase 66  39 - 117 U/L   Total Bilirubin 0.6  0.3 - 1.2 mg/dL   Bilirubin, Direct 0.2  0.0 - 0.3 mg/dL   Indirect Bilirubin 0.4  0.3 - 0.9 mg/dL  TROPONIN I     Status: None   Collection Time    01/14/13 10:06 AM      Result Value Range   Troponin I <0.30  <0.30 ng/mL   Comment:            Due to the release kinetics of cTnI,     a negative result within the first hours     of the onset of symptoms does not rule out     myocardial infarction with certainty.     If myocardial infarction is still suspected,     repeat the test at appropriate intervals.  URINALYSIS, ROUTINE W REFLEX MICROSCOPIC     Status: None   Collection Time    01/14/13 12:13 PM      Result Value Range   Color, Urine YELLOW  YELLOW   APPearance CLEAR  CLEAR  Specific Gravity, Urine 1.010  1.005 - 1.030   pH 5.5  5.0 - 8.0   Glucose, UA NEGATIVE  NEGATIVE mg/dL   Hgb urine dipstick NEGATIVE  NEGATIVE   Bilirubin Urine NEGATIVE  NEGATIVE   Ketones, ur NEGATIVE  NEGATIVE mg/dL   Protein, ur NEGATIVE  NEGATIVE mg/dL   Urobilinogen, UA 0.2  0.0 - 1.0 mg/dL   Nitrite NEGATIVE  NEGATIVE   Leukocytes, UA NEGATIVE  NEGATIVE   Comment: MICROSCOPIC NOT DONE ON URINES WITH NEGATIVE PROTEIN, BLOOD, LEUKOCYTES, NITRITE, OR GLUCOSE <1000 mg/dL.  COMPREHENSIVE METABOLIC  PANEL     Status: Abnormal   Collection Time    01/15/13  5:43 AM      Result Value Range   Sodium 139  135 - 145 mEq/L   Potassium 3.9  3.5 - 5.1 mEq/L   Chloride 101  96 - 112 mEq/L   CO2 30  19 - 32 mEq/L   Glucose, Bld 98  70 - 99 mg/dL   BUN 16  6 - 23 mg/dL   Creatinine, Ser 1.36 (*) 0.50 - 1.10 mg/dL   Calcium 9.6  8.4 - 10.5 mg/dL   Total Protein 6.6  6.0 - 8.3 g/dL   Albumin 3.3 (*) 3.5 - 5.2 g/dL   AST 18  0 - 37 U/L   ALT 10  0 - 35 U/L   Alkaline Phosphatase 54  39 - 117 U/L   Total Bilirubin 0.9  0.3 - 1.2 mg/dL   GFR calc non Af Amer 37 (*) >90 mL/min   GFR calc Af Amer 43 (*) >90 mL/min   Comment: (NOTE)     The eGFR has been calculated using the CKD EPI equation.     This calculation has not been validated in all clinical situations.     eGFR's persistently <90 mL/min signify possible Chronic Kidney     Disease.  CBC     Status: None   Collection Time    01/15/13  5:43 AM      Result Value Range   WBC 6.2  4.0 - 10.5 K/uL   RBC 4.07  3.87 - 5.11 MIL/uL   Hemoglobin 12.0  12.0 - 15.0 g/dL   HCT 36.5  36.0 - 46.0 %   MCV 89.7  78.0 - 100.0 fL   MCH 29.5  26.0 - 34.0 pg   MCHC 32.9  30.0 - 36.0 g/dL   RDW 13.9  11.5 - 15.5 %   Platelets 263  150 - 400 K/uL   Dg Chest 2 View  01/14/2013   CLINICAL DATA:  Preop for cholecystectomy. History of coronary artery disease.  EXAM: CHEST  2 VIEW  COMPARISON:  10/08/2011  FINDINGS: The lateral view is mildly oblique. Prior median sternotomy. Midline trachea. Normal heart size with a mildly tortuous thoracic aorta. No pleural effusion or pneumothorax. Suspect calcified granulomas projecting over the right upper lobe, measuring up to 6 mm. The more inferior and lateral could alternatively represent a button artifact. Clear left lung.  IMPRESSION: Prior median sternotomy, without acute cardiopulmonary disease.   Electronically Signed   By: Abigail Miyamoto   On: 01/14/2013 16:55   Ct Abdomen Pelvis W Contrast  01/14/2013    *RADIOLOGY REPORT*  Clinical Data: Right upper quadrant abdominal pain.  CT ABDOMEN AND PELVIS WITH CONTRAST  Technique:  Multidetector CT imaging of the abdomen and pelvis was performed following the standard protocol during bolus administration of intravenous contrast.  Contrast: 59mL  OMNIPAQUE IOHEXOL 300 MG/ML  SOLN, 63mL OMNIPAQUE IOHEXOL 300 MG/ML  SOLN intravenously.  Comparison: CT scans of October 08, 2011 and April 05, 2010.  Findings: 5 mm nodule is noted in left lower lobe which is stable compared to prior exam of 2011.  No further follow-up is required. The liver, spleen and pancreas appear normal.  Gallstones are noted.  Adrenal glands and kidneys appear normal.  No hydronephrosis or renal obstruction is noted.  No renal or ureteral calculi are noted.  No gallbladder wall thickening or pericholecystic fluid is noted.  No evidence of bowel obstruction is noted.  Urinary bladder appears normal.  Sigmoid diverticulosis is noted without inflammation.  No abnormal fluid collection is seen.  Status post hysterectomy.  No significant adenopathy is noted.  Atherosclerotic calcifications abdominal aorta are noted without aneurysm.  IMPRESSION: Cholelithiasis is again noted.  No pericholecystic fluid is noted. Sigmoid diverticulosis is noted without inflammation.  No acute abnormality is noted in the abdomen or pelvis.   Original Report Authenticated By: Marijo Conception.,  M.D.    Review of Systems  Constitutional: Positive for fever, chills and malaise/fatigue.  Eyes: Negative.   Respiratory: Negative.   Cardiovascular: Negative.   Gastrointestinal: Positive for heartburn, nausea, vomiting and abdominal pain (epigastric). Negative for diarrhea, constipation, blood in stool and melena.  Genitourinary: Negative.   Musculoskeletal: Negative.   Skin: Negative.   Neurological: Positive for headaches.  Endo/Heme/Allergies: Negative.   Psychiatric/Behavioral: Negative.     Blood pressure 140/62, pulse  83, temperature 98.2 F (36.8 C), temperature source Oral, resp. rate 18, height 5\' 2"  (1.575 m), weight 81.647 kg (180 lb), last menstrual period 04/25/1978, SpO2 97.00%. Physical Exam  Constitutional: She is oriented to person, place, and time. She appears well-developed and well-nourished. No distress.  HENT:  Head: Normocephalic and atraumatic.  Eyes: Conjunctivae and EOM are normal. Pupils are equal, round, and reactive to light. No scleral icterus.  Neck: Normal range of motion. Neck supple. No tracheal deviation present.  Cardiovascular: Normal rate, regular rhythm and normal heart sounds.   Respiratory: Effort normal and breath sounds normal. No respiratory distress.  GI: Soft. She exhibits no distension and no mass. There is tenderness (right upper quadrant, positive Murphy sign ). There is guarding. There is no rebound.  Musculoskeletal: Normal range of motion.  Lymphadenopathy:    She has no cervical adenopathy.  Neurological: She is alert and oriented to person, place, and time.  Skin: Skin is warm and dry.     Assessment/Plan Acute cholecystitis cholelithiasis. At this point patient will be admitted for continued IV fluid hydration. Should be continued on IV antibiotics. Cardiology will be consult for preoperative clearance. Options have been discussed with patient. Indications to proceed for surgical cholecystectomy have been discussed at length. Risks benefits alternatives a laparoscopic possible open cholecystectomy have been discussed. Patient will be continued in an n.p.o. status. Continued on IV fluid hydration. DVT prophylaxis  Caycee Wanat C 01/15/2013, 1:38 PM

## 2013-01-15 NOTE — Consult Note (Signed)
CARDIOLOGY CONSULT NOTE   Patient ID: JAHMIYA MCLAIN MRN: [id number] DOB/AGE: [id number] 74 y.o.  Admit Date: 01/14/2013 Referring Physician: Chelsea Primus Primary Physician: Purvis Kilts, MD Consulting Cardiologist: Satira Sark, MD Primary Cardiologist : Sanda Klein [facility]) Reason for Consultation: Pre-Op Evaluation for Cholecystectomy with known hx of CAD  Clinical Summary Ms. Roundtree is a 72 y.o.female with known history of CAD s/p CABG in 2002 ( LIMA to LAD, SVG to 1st diagonal, SVG to OM, sequential SVG to PDA, and PL branch of RCA). Most recent cath in 2005 demonstrated patent graphs, and functional study per Dr. Kathlyn [place] office note 10/2012 was done in 2010 showing no evidence of ischemia. She also has reportedly mild carotid artery disease, hyperlipidemia, and hypertension. She has been clinically stable without angina or progressive shortness of breath, managed medically.  She presented to the ER with right upper quadrant abdominal pain, with CT scan positive for cholelithiasis with planned cholecystectomy on 01/16/2013 per Dr. Geroge Baseman. She states that she had sudden onset of RUQ and epigastric pain which awakened her in the middle of the night prior to admission. Described as a colicky pain with associated nausea but no vomiting. She was treated with pain meds and admitted. She is feeling better today.    Allergies  Allergen Reactions  . Codeine Nausea Only  . Statins     MYALGIA CONFUSION    Medications Scheduled Medications: . cefOXitin  1 g Intravenous Q6H  . enoxaparin (LOVENOX) injection  40 mg Subcutaneous Q24H  . metoprolol  5 mg Intravenous Q6H  . pantoprazole (PROTONIX) IV  40 mg Intravenous QHS    Infusions: . lactated ringers 75 mL/hr at 01/15/13 0349    PRN Medications: acetaminophen, morphine injection, ondansetron   Past Medical History  Diagnosis Date  . Coronary artery disease   . Hyperlipemia   . Systemic hypertension   . Carotid  arterial disease     Past Surgical History  Procedure Laterality Date  . Tonsillectomy  1953  . Tubal ligation    . Partial hysterectomy    . Knee surgery      RIGHT  . Cardiac surgery  01/2001    LIMA TO LAD,SVG TO DIAGONAL,SVG TO MARGINAL,SVG SEQUENTALLY TO THE PD & PL VESSEL    Family History  Problem Relation Age of Onset  . Heart attack Mother   . Heart attack Father   . Congestive Heart Failure Father     Social History Ms. Steiniger reports that she has never smoked. She does not have any smokeless tobacco history on file. Ms. Burrous reports that  drinks alcohol.  Review of Systems Otherwise reviewed and negative except as outlined.  Physical Examination Blood pressure 138/44, pulse 53, temperature 97.9 F (36.6 C), temperature source Oral, resp. rate 18, height 5\' 2"  (1.575 m), weight 180 lb (81.647 kg), last menstrual period 04/25/1978, SpO2 100.00%.  Intake/Output Summary (Last 24 hours) at 01/15/13 1710 Last data filed at 01/15/13 0600  Gross per 24 hour  Intake 1153.75 ml  Output      0 ml  Net 1153.75 ml    HEENT: Conjunctiva and lids normal, oropharynx clear. Neck: Supple, no elevated JVP or carotid bruits, no thyromegaly. Lungs: Clear to auscultation, nonlabored breathing at rest. Cardiac: Regular rate and rhythm, with [id number] systolic murmur best heard at the base, no pericardial rub. Abdomen: Soft, mild tenderness to upper right quadrant,  no hepatomegaly, bowel sounds present, no guarding or rebound. Extremities: No pitting edema,  with left leg SVG site with healing distal incisional ulcer, distal pulses 2+. Skin: Warm and dry. Musculoskeletal: No kyphosis. Neuropsychiatric: Alert and oriented x3, affect grossly appropriate.   Lab Results  Basic Metabolic Panel:  Recent Labs Lab 01/14/13 1006 01/15/13 0543  NA 138 139  K 3.7 3.9  CL 100 101  CO2 24 30  GLUCOSE 125* 98  BUN 25* 16  CREATININE 1.31* 1.36*  CALCIUM 11.1* 9.6    Liver Function  Tests:  Recent Labs Lab 01/14/13 1006 01/15/13 0543  AST 22 18  ALT 14 10  ALKPHOS 66 54  BILITOT 0.6 0.9  PROT 8.6* 6.6  ALBUMIN 4.3 3.3*    CBC:  Recent Labs Lab 01/14/13 1006 01/15/13 0543  WBC 6.7 6.2  NEUTROABS 4.4  --   HGB 14.3 12.0  HCT 43.7 36.5  MCV 88.5 89.7  PLT 280 263    Cardiac Enzymes:  Recent Labs Lab 01/14/13 1006  TROPONINI <0.30    Radiology: Dg Chest 2 View  01/14/2013   CLINICAL DATA:  Preop for cholecystectomy. History of coronary artery disease.  EXAM: CHEST  2 VIEW  COMPARISON:  10/08/2011  FINDINGS: The lateral view is mildly oblique. Prior median sternotomy. Midline trachea. Normal heart size with a mildly tortuous thoracic aorta. No pleural effusion or pneumothorax. Suspect calcified granulomas projecting over the right upper lobe, measuring up to 6 mm. The more inferior and lateral could alternatively represent a button artifact. Clear left lung.  IMPRESSION: Prior median sternotomy, without acute cardiopulmonary disease.   Electronically Signed   By: Abigail Miyamoto   On: 01/14/2013 16:55   Ct Abdomen Pelvis W Contrast  01/14/2013   *RADIOLOGY REPORT*  Clinical Data: Right upper quadrant abdominal pain.  CT ABDOMEN AND PELVIS WITH CONTRAST  Technique:  Multidetector CT imaging of the abdomen and pelvis was performed following the standard protocol during bolus administration of intravenous contrast. MPRESSION: Cholelithiasis is again noted.  No pericholecystic fluid is noted. Sigmoid diverticulosis is noted without inflammation.  No acute abnormality is noted in the abdomen or pelvis.   Original Report Authenticated By: Marijo Conception.,  M.D.    ECG: NSR rate of 76 bpm.    Impression and Recommendations:   1. Preoperative evaluation prior to planned elective cholecystectomy: Patient has known CAD s/p 5 vessel CABG in 2002, had a negative myocardial perfusion study in 2010 based on recent office records of Dr. Sallyanne Kuster, and has been  clinically stable without active angina or CHF symptoms. ECG is normal as well as troponin I. She does have a cardiac murmur, reports that it is long-standing. No recent echocardiogram for review. Will obtain an echocardiogram for tomorrow morning for followup.  Would recommend continued metoprolol 25 mg daily. If unable to take PO, agree with lopressor 5 mg Q 6 hours IV is recommended until able to take PO.  2. Hypertension: On quinapril 5 mg daily along with triameterene-HCTZ 37.5/25 mg daily at home, but this is on hold now while NPO. BP is stable currently.  3. Carotid Artery Disease:  Mild by assessment per Doppler studies in June of 2014 per note in July by Dr. Renaee Munda.   4. Hypercholesterolemia: She is intolerant to statins, but is on Welchol and Omega 3 fatty acid supplements.    Signed: Phill Myron. Purcell Nails NP [facility] 01/15/2013, 5:10 PM Co-Sign MD   Attending note:  Patient seen and examined. Modified above noted by Ms. Lawrence NP, and reviewed available  records. Ms. Erney presents with recent onset of abdominal discomfort, has new diagnosis of cholelithiasis, reporting prior diagnosis of diminished gallbladder function. Elective cholecystectomy is planned by Dr. Geroge Baseman for tomorrow. She is currently n.p.o., feeling better after pain medications. Cardiac history is reviewed above. She has multivessel disease status post CABG in 2002 with patent grafts in 2005 and reportedly negative myocardial perfusion study in 2010. She has been clinically stable without active angina or CHF symptoms, ECG is normal as is troponin I. She is on reasonable medical therapy as an outpatient which includes beta blocker and ACE inhibitor. She does have a cardiac murmur on exam, reports long-standing history. No recent echocardiogram for review. Plan will be to obtain a followup echocardiogram for tomorrow morning. Unless there are any major abnormalities that require further investigation, I would  expect she should be able to proceed with planned elective cholecystectomy at an acceptable perioperative cardiac risk. She describes being functionally active with her ADLs at home, able to do activities exceeding 4 METS without limitation. We will follow with you.  Satira Sark, M.D., F.A.C.C.

## 2013-01-15 NOTE — Progress Notes (Signed)
UR Chart Review Completed  

## 2013-01-15 NOTE — Care Management Note (Signed)
    Page 1 of 1   01/18/2013     11:52:53 AM   CARE MANAGEMENT NOTE 01/18/2013  Patient:  Sandra Bradford, Sandra Bradford   Account Number:  [id number]  Date Initiated:  01/15/2013  Documentation initiated by:  Claretha Cooper  Subjective/Objective Assessment:   Pt admitted from home where she lives alone. Independent upon admission and does not anticipate HH needs. Not normally home bound.     Action/Plan:   Anticipated DC Date:  01/17/2013   Anticipated DC Plan:  [place]  CM consult      Choice offered to / List presented to:             Status of service:  Completed, signed off Medicare Important Message given?  YES (If response is "NO", the following Medicare IM given date fields will be blank) Date Medicare IM given:  01/18/2013 Date Additional Medicare IM given:    Discharge Disposition:    Per UR Regulation:    If discussed at Long Length of Stay Meetings, dates discussed:    Comments:  01/18/13 Claretha Cooper RN BSN CM DC home no HH needs.  01/16/13 Claretha Cooper RN BSN CM Surgery planned for 01/17/13  01/15/13 Claretha Cooper RN BSN CM

## 2013-01-16 DIAGNOSIS — I369 Nonrheumatic tricuspid valve disorder, unspecified: Secondary | ICD-10-CM

## 2013-01-16 LAB — SURGICAL PCR SCREEN
MRSA, PCR: NEGATIVE
Staphylococcus aureus: NEGATIVE

## 2013-01-16 MED ORDER — CIPROFLOXACIN IN D5W 400 MG/200ML IV SOLN: 400.0000 mg | INTRAVENOUS | Status: DC

## 2013-01-16 MED ORDER — METRONIDAZOLE IN NACL 5-0.79 MG/ML-% IV SOLN: 500.0000 mg | INTRAVENOUS | Status: DC

## 2013-01-16 MED ORDER — HYDRALAZINE HCL 20 MG/ML IJ SOLN
10.0000 mg | Freq: Three times a day (TID) | INTRAMUSCULAR | Status: DC
  Administered 2013-01-16 – 2013-01-18 (×3): 10 mg via INTRAVENOUS
  Filled 2013-01-16 (×3): qty 1

## 2013-01-16 NOTE — Progress Notes (Signed)
*  PRELIMINARY RESULTS* Echocardiogram 2D Echocardiogram has been performed.  [place], [place] 01/16/2013, 9:26 AM

## 2013-01-16 NOTE — Progress Notes (Signed)
SUBJECTIVE: Pt doing well, denying chest pain and shortness of breath. Has not had leg swelling recently. Denies abdominal pain. Surgery planned for tomorrow. I personally reviewed the echocardiogram, with results as follows:  Left ventricle: Wall thickness was increased in a pattern of mild LVH. Systolic function was normal. The estimated ejection fraction was in the range of 60% to 65%. There was a reduced contribution of atrial contraction to ventricular filling, due to increased ventricular diastolic pressure or atrial contractile dysfunction. Findings consistent with left ventricular diastolic dysfunction. - Aortic valve: Mildly thickened, mildly calcified leaflets. There was no stenosis. No regurgitation. - Aorta: The aorta was mildly calcified. - Mitral valve: Mildly thickened leaflets . No stenosis and no signficant regurgitation. - Left atrium: The atrium was mildly dilated. - Tricuspid valve: Mild regurgitation. - Pulmonary arteries: PA peak pressure: 66mm Hg (S).     Intake/Output Summary (Last 24 hours) at 01/16/13 1254 Last data filed at 01/16/13 0600  Gross per 24 hour  Intake   1950 ml  Output      0 ml  Net   1950 ml    Current Facility-Administered Medications  Medication Dose Route Frequency Provider Last Rate Last Dose  . acetaminophen (TYLENOL) tablet 650 mg  650 mg Oral Q4H PRN Donato Heinz, MD   650 mg at 01/15/13 1010  . cefOXitin (MEFOXIN) 1 g in dextrose 5 % 50 mL IVPB  1 g Intravenous Q6H Donato Heinz, MD   1 g at 01/16/13 0409  . enoxaparin (LOVENOX) injection 40 mg  40 mg Subcutaneous Q24H Donato Heinz, MD   40 mg at 01/15/13 1757  . lactated ringers infusion   Intravenous Continuous Donato Heinz, MD 75 mL/hr at 01/16/13 [phone]    . metoprolol (LOPRESSOR) injection 5 mg  5 mg Intravenous Q6H Donato Heinz, MD   5 mg at 01/15/13 1229  . morphine 2 MG/ML injection 1-4 mg  1-4 mg Intravenous Q4H PRN Donato Heinz, MD      .  ondansetron [facility]) injection 4 mg  4 mg Intravenous Q6H PRN Donato Heinz, MD      . pantoprazole (PROTONIX) injection 40 mg  40 mg Intravenous QHS Donato Heinz, MD   40 mg at 01/15/13 2215    Filed Vitals:   01/15/13 1229 01/15/13 1338 01/15/13 2320 01/16/13 0709  BP: 140/62 138/44 134/69 158/68  Pulse:  53 68 64  Temp:  97.9 F (36.6 C) 98.2 F (36.8 C) 98 F (36.7 C)  TempSrc:   Oral Oral  Resp:  18 18 18   Height:      Weight:      SpO2:  100%  96%    PHYSICAL EXAM General: NAD Neck: No JVD, no thyromegaly or thyroid nodule.  Lungs: Clear to auscultation bilaterally with normal respiratory effort. CV: Nondisplaced PMI.  Heart regular S1/S2, no S3/S4, II/VI ejection systolic murmur heard at RUSB/LUSB.  No pitting peripheral edema.  No carotid bruit.  Normal pedal pulses.  Abdomen: Soft, nontender, no hepatosplenomegaly, no distention.  Neurologic: Alert and oriented x 3.  Psych: Normal affect. Extremities: No clubbing or cyanosis.     LABS: Basic Metabolic Panel:  Recent Labs  01/14/13 1006 01/15/13 0543  NA 138 139  K 3.7 3.9  CL 100 101  CO2 24 30  GLUCOSE 125* 98  BUN 25* 16  CREATININE 1.31* 1.36*  CALCIUM 11.1* 9.6   Liver Function Tests:  Recent Labs  01/14/13 1006 01/15/13 0543  AST 22 18  ALT 14 10  ALKPHOS 66 54  BILITOT 0.6 0.9  PROT 8.6* 6.6  ALBUMIN 4.3 3.3*    Recent Labs  01/14/13 1006  LIPASE 49   CBC:  Recent Labs  01/14/13 1006 01/15/13 0543  WBC 6.7 6.2  NEUTROABS 4.4  --   HGB 14.3 12.0  HCT 43.7 36.5  MCV 88.5 89.7  PLT 280 263   Cardiac Enzymes:  Recent Labs  01/14/13 1006  TROPONINI <0.30   BNP: No components found with this basename: POCBNP,  D-Dimer: No results found for this basename: DDIMER,  in the last 72 hours Hemoglobin A1C: No results found for this basename: HGBA1C,  in the last 72 hours Fasting Lipid Panel: No results found for this basename: CHOL, HDL, LDLCALC, TRIG, CHOLHDL,  LDLDIRECT,  in the last 72 hours Thyroid Function Tests: No results found for this basename: TSH, T4TOTAL, FREET3, T3FREE, THYROIDAB,  in the last 72 hours Anemia Panel: No results found for this basename: VITAMINB12, FOLATE, FERRITIN, TIBC, IRON, RETICCTPCT,  in the last 72 hours  RADIOLOGY: Dg Chest 2 View  01/14/2013   CLINICAL DATA:  Preop for cholecystectomy. History of coronary artery disease.  EXAM: CHEST  2 VIEW  COMPARISON:  10/08/2011  FINDINGS: The lateral view is mildly oblique. Prior median sternotomy. Midline trachea. Normal heart size with a mildly tortuous thoracic aorta. No pleural effusion or pneumothorax. Suspect calcified granulomas projecting over the right upper lobe, measuring up to 6 mm. The more inferior and lateral could alternatively represent a button artifact. Clear left lung.  IMPRESSION: Prior median sternotomy, without acute cardiopulmonary disease.   Electronically Signed   By: Abigail Miyamoto   On: 01/14/2013 16:55   Ct Abdomen Pelvis W Contrast  01/14/2013   *RADIOLOGY REPORT*  Clinical Data: Right upper quadrant abdominal pain.  CT ABDOMEN AND PELVIS WITH CONTRAST  Technique:  Multidetector CT imaging of the abdomen and pelvis was performed following the standard protocol during bolus administration of intravenous contrast.  Contrast: 59mL OMNIPAQUE IOHEXOL 300 MG/ML  SOLN, 39mL OMNIPAQUE IOHEXOL 300 MG/ML  SOLN intravenously.  Comparison: CT scans of October 08, 2011 and April 05, 2010.  Findings: 5 mm nodule is noted in left lower lobe which is stable compared to prior exam of 2011.  No further follow-up is required. The liver, spleen and pancreas appear normal.  Gallstones are noted.  Adrenal glands and kidneys appear normal.  No hydronephrosis or renal obstruction is noted.  No renal or ureteral calculi are noted.  No gallbladder wall thickening or pericholecystic fluid is noted.  No evidence of bowel obstruction is noted.  Urinary bladder appears normal.  Sigmoid  diverticulosis is noted without inflammation.  No abnormal fluid collection is seen.  Status post hysterectomy.  No significant adenopathy is noted.  Atherosclerotic calcifications abdominal aorta are noted without aneurysm.  IMPRESSION: Cholelithiasis is again noted.  No pericholecystic fluid is noted. Sigmoid diverticulosis is noted without inflammation.  No acute abnormality is noted in the abdomen or pelvis.   Original Report Authenticated By: Marijo Conception.,  M.D.      ASSESSMENT AND PLAN:  1. Preoperative cardiac risk stratification prior to planned elective cholecystectomy: Patient has known CAD s/p 5 vessel CABG in 2002 with patent grafts, and reportedly had a negative myocardial perfusion study in 2010 based on recent office records of Dr. Sallyanne Kuster, and has been clinically stable without active angina or CHF symptoms. ECG  is normal as well as troponin I. She describes being functionally active with her ADLs at home, able to do activities exceeding 4 METS without limitation. She has normal LV systolic function with some degree of diastolic dysfunction. Currently on IV metoprolol for perioperative risk reduction, which I would continue and transition back to oral metoprolol post-operatively when deemed feasible. She should be able to proceed with planned elective cholecystectomy at an acceptable perioperative cardiac risk.  2. Hypertension: On quinapril 5 mg daily along with triameterene-HCTZ 37.5/25 mg daily at home, but this is on hold now while NPO. BP is mildly elevated currently. I will start IV hydralazine 20 mg TID for more optimal control.  3. Carotid Artery Disease: Mild by assessment per Doppler studies in June of 2014 per note in July by Dr. Renaee Munda.   4. Hypercholesterolemia: She is intolerant to statins, but is on Welchol and Omega 3 fatty acid supplements.    Kate Sable, M.D., F.A.C.C.

## 2013-01-16 NOTE — Progress Notes (Signed)
  Subjective: Pain resolved.  No SOB or CP.  No nausea  Objective: Vital signs in last 24 hours: Temp:  [98 F (36.7 C)-98.2 F (36.8 C)] 98.1 F (36.7 C) (09/24 1458) Pulse Rate:  [64-68] 66 (09/24 1458) Resp:  [18] 18 (09/24 1458) BP: (126-158)/(60-69) 126/60 mmHg (09/24 1458) SpO2:  [95 %-96 %] 95 % (09/24 1458) Last BM Date: 01/16/13  Intake/Output from previous day: 09/23 0701 - 09/24 0700 In: 2000 [I.V.:1800; IV Piggyback:200] Out: -  Intake/Output this shift:    General appearance: alert and no distress GI: +BS, soft, mild ruq tenderness.  no peritoneal signs.  Lab Results:   Recent Labs  01/14/13 1006 01/15/13 0543  WBC 6.7 6.2  HGB 14.3 12.0  HCT 43.7 36.5  PLT 280 263   BMET  Recent Labs  01/14/13 1006 01/15/13 0543  NA 138 139  K 3.7 3.9  CL 100 101  CO2 24 30  GLUCOSE 125* 98  BUN 25* 16  CREATININE 1.31* 1.36*  CALCIUM 11.1* 9.6   PT/INR No results found for this basename: LABPROT, INR,  in the last 72 hours ABG No results found for this basename: PHART, PCO2, PO2, HCO3,  in the last 72 hours  Studies/Results: Dg Chest 2 View  01/14/2013   CLINICAL DATA:  Preop for cholecystectomy. History of coronary artery disease.  EXAM: CHEST  2 VIEW  COMPARISON:  10/08/2011  FINDINGS: The lateral view is mildly oblique. Prior median sternotomy. Midline trachea. Normal heart size with a mildly tortuous thoracic aorta. No pleural effusion or pneumothorax. Suspect calcified granulomas projecting over the right upper lobe, measuring up to 6 mm. The more inferior and lateral could alternatively represent a button artifact. Clear left lung.  IMPRESSION: Prior median sternotomy, without acute cardiopulmonary disease.   Electronically Signed   By: Abigail Miyamoto   On: 01/14/2013 16:55    Anti-infectives: Anti-infectives   Start     Dose/Rate Route Frequency Ordered Stop   01/17/13 0600  metroNIDAZOLE (FLAGYL) IVPB 500 mg     500 mg 100 mL/hr over 60 Minutes  Intravenous On call to O.R. 01/16/13 1525 01/18/13 0559   01/17/13 0600  ciprofloxacin (CIPRO) IVPB 400 mg     400 mg 200 mL/hr over 60 Minutes Intravenous On call to O.R. 01/16/13 1525 01/18/13 0559   01/14/13 1800  cefOXitin (MEFOXIN) 1 g in dextrose 5 % 50 mL IVPB  Status:  Discontinued     1 g 100 mL/hr over 30 Minutes Intravenous 4 times per day 01/14/13 1541 01/16/13 1521      Assessment/Plan: s/p Procedure(s): LAPAROSCOPIC CHOLECYSTECTOMY (N/A) appreciate cardiology's assistance.  Will plan to proceed to OR in AM.  Soft diet for now as tolerated.  NPO after midnight.  Surgical options again discussed with family and patient.    LOS: 2 days    Colum Colt C 01/16/2013

## 2013-01-16 NOTE — Progress Notes (Signed)
2D Echo completed at this time.  Cardiology notified to read result as soon as possible in case patient is able to go to surgery today.Schonewitz, Eulis Canner 01/16/2013

## 2013-01-17 ENCOUNTER — Inpatient Hospital Stay (HOSPITAL_COMMUNITY): Payer: Medicare Other | Admitting: Anesthesiology

## 2013-01-17 ENCOUNTER — Encounter (HOSPITAL_COMMUNITY): Payer: Self-pay | Admitting: *Deleted

## 2013-01-17 ENCOUNTER — Encounter (HOSPITAL_COMMUNITY): Payer: Self-pay | Admitting: Anesthesiology

## 2013-01-17 ENCOUNTER — Encounter (HOSPITAL_COMMUNITY)
Admission: EM | Disposition: A | Discharge status: Home / Self Care | Payer: Self-pay | Source: Home / Self Care | Attending: General Surgery

## 2013-01-17 HISTORY — PX: CHOLECYSTECTOMY: SHX55

## 2013-01-17 SURGERY — LAPAROSCOPIC CHOLECYSTECTOMY
Anesthesia: General | Site: Abdomen | Wound class: Contaminated

## 2013-01-17 MED ORDER — MIDAZOLAM HCL 2 MG/2ML IJ SOLN
1.0000 mg | INTRAMUSCULAR | Status: DC | PRN
  Administered 2013-01-17: 2 mg via INTRAVENOUS

## 2013-01-17 MED ORDER — LACTATED RINGERS IV SOLN
INTRAVENOUS | Status: DC | PRN
  Administered 2013-01-17 (×2): via INTRAVENOUS

## 2013-01-17 MED ORDER — CIPROFLOXACIN IN D5W 400 MG/200ML IV SOLN
INTRAVENOUS | Status: AC
  Filled 2013-01-17: qty 200

## 2013-01-17 MED ORDER — LIDOCAINE HCL (PF) 1 % IJ SOLN
INTRAMUSCULAR | Status: AC
  Filled 2013-01-17: qty 5

## 2013-01-17 MED ORDER — SCOPOLAMINE 1 MG/3DAYS TD PT72
MEDICATED_PATCH | TRANSDERMAL | Status: AC
  Filled 2013-01-17: qty 1

## 2013-01-17 MED ORDER — FENTANYL CITRATE 0.05 MG/ML IJ SOLN
INTRAMUSCULAR | Status: AC
  Filled 2013-01-17: qty 5

## 2013-01-17 MED ORDER — SODIUM CHLORIDE 0.9 % IR SOLN
Status: DC | PRN
  Administered 2013-01-17: 3000 mL
  Administered 2013-01-17: 1000 mL

## 2013-01-17 MED ORDER — CELECOXIB 100 MG PO CAPS
200.0000 mg | ORAL_CAPSULE | Freq: Two times a day (BID) | ORAL | Status: DC
  Administered 2013-01-17 – 2013-01-18 (×3): 200 mg via ORAL
  Filled 2013-01-17 (×2): qty 2

## 2013-01-17 MED ORDER — METRONIDAZOLE IN NACL 5-0.79 MG/ML-% IV SOLN
500.0000 mg | INTRAVENOUS | Status: AC
  Administered 2013-01-17: .5 g via INTRAVENOUS

## 2013-01-17 MED ORDER — FENTANYL CITRATE 0.05 MG/ML IJ SOLN
INTRAMUSCULAR | Status: DC | PRN
  Administered 2013-01-17 (×4): 50 ug via INTRAVENOUS
  Administered 2013-01-17 (×2): 25 ug via INTRAVENOUS
  Administered 2013-01-17: 50 ug via INTRAVENOUS

## 2013-01-17 MED ORDER — NEOSTIGMINE METHYLSULFATE 1 MG/ML IJ SOLN
INTRAMUSCULAR | Status: DC | PRN
  Administered 2013-01-17: 3 mg via INTRAVENOUS

## 2013-01-17 MED ORDER — FENTANYL CITRATE 0.05 MG/ML IJ SOLN
25.0000 ug | INTRAMUSCULAR | Status: DC | PRN
  Administered 2013-01-17 (×2): 25 ug via INTRAVENOUS

## 2013-01-17 MED ORDER — GLYCOPYRROLATE 0.2 MG/ML IJ SOLN
0.2000 mg | Freq: Once | INTRAMUSCULAR | Status: AC
  Administered 2013-01-17: 0.2 mg via INTRAVENOUS

## 2013-01-17 MED ORDER — GLYCOPYRROLATE 0.2 MG/ML IJ SOLN
INTRAMUSCULAR | Status: DC | PRN
  Administered 2013-01-17: .5 mg via INTRAVENOUS

## 2013-01-17 MED ORDER — GLYCOPYRROLATE 0.2 MG/ML IJ SOLN
INTRAMUSCULAR | Status: AC
  Filled 2013-01-17: qty 3

## 2013-01-17 MED ORDER — DEXAMETHASONE SODIUM PHOSPHATE 4 MG/ML IJ SOLN
INTRAMUSCULAR | Status: AC
  Filled 2013-01-17: qty 1

## 2013-01-17 MED ORDER — FENTANYL CITRATE 0.05 MG/ML IJ SOLN
INTRAMUSCULAR | Status: AC
  Filled 2013-01-17: qty 2

## 2013-01-17 MED ORDER — ROCURONIUM BROMIDE 100 MG/10ML IV SOLN
INTRAVENOUS | Status: DC | PRN
  Administered 2013-01-17 (×2): 25 mg via INTRAVENOUS
  Administered 2013-01-17: 50 mg via INTRAVENOUS
  Administered 2013-01-17: 5 mg via INTRAVENOUS

## 2013-01-17 MED ORDER — DEXAMETHASONE SODIUM PHOSPHATE 4 MG/ML IJ SOLN
4.0000 mg | Freq: Once | INTRAMUSCULAR | Status: AC
  Administered 2013-01-17: 4 mg via INTRAVENOUS

## 2013-01-17 MED ORDER — BUPIVACAINE HCL (PF) 0.5 % IJ SOLN
INTRAMUSCULAR | Status: AC
  Filled 2013-01-17: qty 30

## 2013-01-17 MED ORDER — HYDROCODONE-ACETAMINOPHEN 5-325 MG PO TABS: 1.0000 | ORAL_TABLET | ORAL | Status: DC | PRN

## 2013-01-17 MED ORDER — LACTATED RINGERS IV SOLN: INTRAVENOUS | Status: DC

## 2013-01-17 MED ORDER — LISINOPRIL 5 MG PO TABS
5.0000 mg | ORAL_TABLET | Freq: Every day | ORAL | Status: DC
  Administered 2013-01-18: 5 mg via ORAL
  Filled 2013-01-17: qty 1

## 2013-01-17 MED ORDER — SUCCINYLCHOLINE CHLORIDE 20 MG/ML IJ SOLN
INTRAMUSCULAR | Status: AC
  Filled 2013-01-17: qty 1

## 2013-01-17 MED ORDER — FENTANYL CITRATE 0.05 MG/ML IJ SOLN
25.0000 ug | INTRAMUSCULAR | Status: AC
  Administered 2013-01-17 (×2): 25 ug via INTRAVENOUS

## 2013-01-17 MED ORDER — SCOPOLAMINE 1 MG/3DAYS TD PT72
1.0000 | MEDICATED_PATCH | Freq: Once | TRANSDERMAL | Status: DC
  Administered 2013-01-17: 1.5 mg via TRANSDERMAL

## 2013-01-17 MED ORDER — ROCURONIUM BROMIDE 50 MG/5ML IV SOLN
INTRAVENOUS | Status: AC
  Filled 2013-01-17: qty 1

## 2013-01-17 MED ORDER — BUPIVACAINE HCL (PF) 0.5 % IJ SOLN
INTRAMUSCULAR | Status: DC | PRN
  Administered 2013-01-17: 10 mL

## 2013-01-17 MED ORDER — MORPHINE SULFATE 2 MG/ML IJ SOLN
1.0000 mg | INTRAMUSCULAR | Status: DC | PRN
  Administered 2013-01-17: 1 mg via INTRAVENOUS
  Filled 2013-01-17: qty 1

## 2013-01-17 MED ORDER — GLYCOPYRROLATE 0.2 MG/ML IJ SOLN
INTRAMUSCULAR | Status: AC
  Filled 2013-01-17: qty 1

## 2013-01-17 MED ORDER — ONDANSETRON HCL 4 MG/2ML IJ SOLN: 4.0000 mg | Freq: Once | INTRAMUSCULAR | Status: DC | PRN

## 2013-01-17 MED ORDER — METRONIDAZOLE IN NACL 5-0.79 MG/ML-% IV SOLN
INTRAVENOUS | Status: AC
  Filled 2013-01-17: qty 100

## 2013-01-17 MED ORDER — PROPOFOL 10 MG/ML IV BOLUS
INTRAVENOUS | Status: DC | PRN
  Administered 2013-01-17: 120 mg via INTRAVENOUS

## 2013-01-17 MED ORDER — MIDAZOLAM HCL 2 MG/2ML IJ SOLN
INTRAMUSCULAR | Status: AC
  Filled 2013-01-17: qty 2

## 2013-01-17 MED ORDER — LIDOCAINE HCL (CARDIAC) 20 MG/ML IV SOLN
INTRAVENOUS | Status: DC | PRN
  Administered 2013-01-17: 30 mg via INTRAVENOUS

## 2013-01-17 MED ORDER — ONDANSETRON HCL 4 MG/2ML IJ SOLN
4.0000 mg | Freq: Once | INTRAMUSCULAR | Status: AC
  Administered 2013-01-17: 4 mg via INTRAVENOUS

## 2013-01-17 MED ORDER — TRIAMTERENE-HCTZ 37.5-25 MG PO TABS
1.0000 | ORAL_TABLET | Freq: Every day | ORAL | Status: DC
  Administered 2013-01-18: 1 via ORAL
  Filled 2013-01-17: qty 1

## 2013-01-17 MED ORDER — EZETIMIBE 10 MG PO TABS
10.0000 mg | ORAL_TABLET | Freq: Every day | ORAL | Status: DC
  Administered 2013-01-18: 10 mg via ORAL
  Filled 2013-01-17: qty 1

## 2013-01-17 MED ORDER — PROPOFOL 10 MG/ML IV EMUL
INTRAVENOUS | Status: AC
  Filled 2013-01-17: qty 20

## 2013-01-17 MED ORDER — CIPROFLOXACIN IN D5W 400 MG/200ML IV SOLN
400.0000 mg | INTRAVENOUS | Status: AC
  Administered 2013-01-17: 400 mg via INTRAVENOUS

## 2013-01-17 MED ORDER — HEMOSTATIC AGENTS (NO CHARGE) OPTIME
TOPICAL | Status: DC | PRN
  Administered 2013-01-17: 1 via TOPICAL

## 2013-01-17 MED ORDER — METOPROLOL TARTRATE 25 MG PO TABS
25.0000 mg | ORAL_TABLET | Freq: Two times a day (BID) | ORAL | Status: DC
  Administered 2013-01-18: 25 mg via ORAL
  Filled 2013-01-17 (×2): qty 1

## 2013-01-17 SURGICAL SUPPLY — 42 items
APPLIER CLIP 5 13 M/L LIGAMAX5 (MISCELLANEOUS) ×2
BAG HAMPER (MISCELLANEOUS) ×2 IMPLANT
BENZOIN TINCTURE PRP APPL 2/3 (GAUZE/BANDAGES/DRESSINGS) ×2 IMPLANT
CLIP APPLIE 5 13 M/L LIGAMAX5 (MISCELLANEOUS) ×1 IMPLANT
CLOTH BEACON ORANGE TIMEOUT ST (SAFETY) ×2 IMPLANT
COVER LIGHT HANDLE STERIS (MISCELLANEOUS) ×4 IMPLANT
DECANTER SPIKE VIAL GLASS SM (MISCELLANEOUS) ×2 IMPLANT
DEVICE TROCAR PUNCTURE CLOSURE (ENDOMECHANICALS) ×2 IMPLANT
DURAPREP 26ML APPLICATOR (WOUND CARE) ×2 IMPLANT
ELECT REM PT RETURN 9FT ADLT (ELECTROSURGICAL) ×2
ELECTRODE REM PT RTRN 9FT ADLT (ELECTROSURGICAL) ×1 IMPLANT
FILTER SMOKE EVAC LAPAROSHD (FILTER) ×2 IMPLANT
FORMALIN 10 PREFIL 120ML (MISCELLANEOUS) ×2 IMPLANT
GLOVE BIOGEL PI IND STRL 7.0 (GLOVE) ×2 IMPLANT
GLOVE BIOGEL PI IND STRL 7.5 (GLOVE) ×1 IMPLANT
GLOVE BIOGEL PI INDICATOR 7.0 (GLOVE) ×2
GLOVE BIOGEL PI INDICATOR 7.5 (GLOVE) ×1
GLOVE ECLIPSE 7.0 STRL STRAW (GLOVE) ×4 IMPLANT
GLOVE EXAM NITRILE LRG STRL (GLOVE) ×4 IMPLANT
GLOVE SS BIOGEL STRL SZ 6.5 (GLOVE) ×1 IMPLANT
GLOVE SUPERSENSE BIOGEL SZ 6.5 (GLOVE) ×1
GOWN STRL REIN XL XLG (GOWN DISPOSABLE) ×6 IMPLANT
HEMOSTAT SNOW SURGICEL 2X4 (HEMOSTASIS) ×2 IMPLANT
INST SET LAPROSCOPIC AP (KITS) ×2 IMPLANT
IV NS IRRIG 3000ML ARTHROMATIC (IV SOLUTION) ×2 IMPLANT
KIT ROOM TURNOVER APOR (KITS) ×2 IMPLANT
MANIFOLD NEPTUNE II (INSTRUMENTS) ×2 IMPLANT
NEEDLE INSUFFLATION 14GA 120MM (NEEDLE) ×2 IMPLANT
NS IRRIG 1000ML POUR BTL (IV SOLUTION) ×2 IMPLANT
PACK LAP CHOLE LZT030E (CUSTOM PROCEDURE TRAY) ×2 IMPLANT
PAD ARMBOARD 7.5X6 YLW CONV (MISCELLANEOUS) ×2 IMPLANT
POUCH SPECIMEN RETRIEVAL 10MM (ENDOMECHANICALS) ×2 IMPLANT
SET BASIN LINEN APH (SET/KITS/TRAYS/PACK) ×2 IMPLANT
SET TUBE IRRIG SUCTION NO TIP (IRRIGATION / IRRIGATOR) ×2 IMPLANT
SLEEVE ENDOPATH XCEL 5M (ENDOMECHANICALS) ×4 IMPLANT
STRIP CLOSURE SKIN 1/2X4 (GAUZE/BANDAGES/DRESSINGS) ×2 IMPLANT
SUT MNCRL AB 4-0 PS2 18 (SUTURE) ×4 IMPLANT
SUT VIC AB 2-0 CT2 27 (SUTURE) ×4 IMPLANT
TROCAR ENDO BLADELESS 11MM (ENDOMECHANICALS) ×2 IMPLANT
TROCAR XCEL NON-BLD 5MMX100MML (ENDOMECHANICALS) ×2 IMPLANT
TUBING INSUFFLATION (TUBING) ×2 IMPLANT
WARMER LAPAROSCOPE (MISCELLANEOUS) ×2 IMPLANT

## 2013-01-17 NOTE — Progress Notes (Signed)
Patient ambulated in hallway.  Patient ambulated approximately 150 feet. Patient tolerated ambulation well, c/o of no pain at this time.

## 2013-01-17 NOTE — Progress Notes (Signed)
Day of Surgery  Subjective: Pain remains unchanged. Still with mild epigastric discomfort. No nausea. No complaints of shortness of breath or chest pain. No new questions  Objective: Vital signs in last 24 hours: Temp:  [98 F (36.7 C)-98.3 F (36.8 C)] 98.3 F (36.8 C) (09/25 1030) Pulse Rate:  [66-81] 81 (09/25 0554) Resp:  [13-18] 13 (09/25 1030) BP: (120-165)/(57-84) 165/72 mmHg (09/25 1030) SpO2:  [95 %-98 %] 96 % (09/25 1030) Weight:  [81.647 kg (180 lb)] 81.647 kg (180 lb) (09/25 1030) Last BM Date: 01/16/13  Intake/Output from previous day: 09/24 0701 - 09/25 0700 In: 480 [P.O.:480] Out: -  Intake/Output this shift:    General appearance: alert and no distress Resp: clear to auscultation bilaterally GI: Positive bowel sounds, soft, mild epigastric tenderness.  Lab Results:   Recent Labs  01/15/13 0543  WBC 6.2  HGB 12.0  HCT 36.5  PLT 263   BMET  Recent Labs  01/15/13 0543  NA 139  K 3.9  CL 101  CO2 30  GLUCOSE 98  BUN 16  CREATININE 1.36*  CALCIUM 9.6   PT/INR No results found for this basename: LABPROT, INR,  in the last 72 hours ABG No results found for this basename: PHART, PCO2, PO2, HCO3,  in the last 72 hours  Studies/Results: No results found.  Anti-infectives: Anti-infectives   Start     Dose/Rate Route Frequency Ordered Stop   01/17/13 1045  metroNIDAZOLE (FLAGYL) IVPB 500 mg     500 mg 100 mL/hr over 60 Minutes Intravenous On call to O.R. 01/17/13 1042 01/18/13 0559   01/17/13 1045  ciprofloxacin (CIPRO) IVPB 400 mg     400 mg 200 mL/hr over 60 Minutes Intravenous On call to O.R. 01/17/13 1042 01/18/13 0559   01/17/13 0600  metroNIDAZOLE (FLAGYL) IVPB 500 mg  Status:  Discontinued     500 mg 100 mL/hr over 60 Minutes Intravenous On call to O.R. 01/16/13 1525 01/16/13 1531   01/17/13 0600  ciprofloxacin (CIPRO) IVPB 400 mg  Status:  Discontinued     400 mg 200 mL/hr over 60 Minutes Intravenous On call to O.R. 01/16/13 1525  01/16/13 1532   01/14/13 1800  cefOXitin (MEFOXIN) 1 g in dextrose 5 % 50 mL IVPB  Status:  Discontinued     1 g 100 mL/hr over 30 Minutes Intravenous 4 times per day 01/14/13 1541 01/16/13 1521      Assessment/Plan: s/p Procedure(s): LAPAROSCOPIC CHOLECYSTECTOMY (N/A) Acute cholecystitis. At this point we'll plan to proceed to the operating room for laparoscopic cholecystectomy. Risks benefits alternatives again discussed. We'll proceed  LOS: 3 days    Freya Zobrist C 01/17/2013

## 2013-01-17 NOTE — Op Note (Signed)
Patient:  Sandra Bradford  DOB:  11/15/1940  MRN:  [id number]   Preop Diagnosis:  Acute cholecystitis  Postop Diagnosis:  The same  Procedure:  Laparoscopic cholecystectomy  Surgeon:  Dr. Chelsea Primus  Anes:  General endotracheal, 0.5% Sensorcaine plain for local  Indications:  Patient is a 72 year old female presented to [facility] with epigastric and right up quadrant abdominal pain. Workup and evaluation was consistent for acute cholecystitis. Due to her cardiac history she did undergo cardiac evaluation prior to proceeding to the operating room for cardiac clearance. Risks benefits alternatives a laparoscopic possible open cholecystectomy were discussed at length the patient including but not limited to risk of bleeding, infection, bile leak, small bowel injury,, nontender, intraoperative cardiac and pulmonary events. Patient's questions and concerns are addressed the patient as consented for the planned procedure.  Procedure note:  Patient is taken to the operating room was placed in supine position the or table time the general anesthetic is administered. Once patient was asleep she symmetrically admitted by the nurse anesthetist. At this point her abdomen is prepped with DuraPrep solution and draped in standard fashion. Time out was performed. Stab incision was created supraumbilically with 11 blade scalpel additional dissection down to subcuticular tissue carried out using a Coker clamp. Clamp was utilized to grasp the anterior normal fascia and lift his anteriorly. A Veress needle is inserted saline drop test is utilized confirm intraperitoneal placement and then pneumoperitoneum is initiated. Once sufficient pneumoperitoneum was obtained an 11 mm insert overlap scope allowing visualization the trocar entering into the peritoneal cavity. At this point the inner cannulas removed lap scope was reinserted there is no evidence of any trocar or Veress needle placement injury. At this time  the remaining trochars replaced with a 5 mm can epigastrium, 5 mm trocar in the midline between the 25 mm trochars and a 5 mm trocar in the right lateral abdominal wall. These were all placed under visualization with the laparoscope. Patient's placed into a reverse Trendelenburg left lateral decubitus position. The fundus of the gallbladder is identified and grasped and lifted up and over the right lobe the liver. Blunt dissection is carried out using a [place] to expose both the cystic duct and cystic arteries and her into the infundibulum. A window was created behind both the structures. 3 endoclips were placed proximally one distally on the cystic duct and cystic duct was divided between 2 most is a clips. Similarly the cystic artery is identified 2 endoclips placed proximally one distally and cystic arteries divided between 2 most distal clips. At this time electrocautery utilized dissect the gallbladder free from the gallbladder fossa. During this dissection a small cholecystotomy was created a small amount bile spillage. This was quickly controlled with a grasper. Once the gallbladder was free it was placed into an Endo Catch bag and placed into the right lower quadrant. To facilitate this the 10 mm scope is exchanged for a 5 mm scope. The gallbladder fossa was then copiously irrigated with warm sterile saline. Returning aspirate was clear. Inspection the gallbladder fossa indicate excellent hemostasis. The endoclips were inspected there is no evidence of any bleeding or bile leak. The general nature the gallbladder fossa I did opt to place a piece of Surgicel snow into the gallbladder fossa for additional hemostasis. Attention at this time was turned to closure.  Using an Endo Close suture passing device a 2-0 Vicryl suture was passed to the umbilical trocar site. With this suture and placed  the gallbladder is retrieved was removed through the umbilical trocar site and intact Endo Catch bag. It  was placed in the back table and sent as a perm specimen to pathology. At this time the pneumoperitoneum was evacuated. The trochars were removed. The Vicryl suture was secured. The skin edges were approximated all 4 trocar sites with a 4-0 Monocryl in running subcuticular suture. The local anesthetic was instilled. The skin was washed dried moist dry towel. Benzoin is applied around the incisions. Half-inch Steri-Strips are placed. The drapes removed patient left come out of general anesthetic. Patient was transferred to the PACU in stable condition. At the conclusion of procedure all instrument, sponge, needle counts are correct. Patient tolerated procedure extremely well.  Complications:  None apparent  EBL:  Less than 50 ML's.  Specimen:  Gallbladder

## 2013-01-17 NOTE — Transfer of Care (Signed)
Immediate Anesthesia Transfer of Care Note  Patient: Sandra Bradford  Procedure(s) Performed: Procedure(s): LAPAROSCOPIC CHOLECYSTECTOMY (N/A)  Patient Location: PACU  Anesthesia Type:General  Level of Consciousness: awake  Airway & Oxygen Therapy: Patient Spontanous Breathing and Patient connected to face mask oxygen  Post-op Assessment: Report given to PACU RN  Post vital signs: Reviewed and stable  Complications: No apparent anesthesia complications

## 2013-01-17 NOTE — Anesthesia Postprocedure Evaluation (Addendum)
  Anesthesia Post-op Note  Patient: Sandra Bradford  Procedure(s) Performed: Procedure(s): LAPAROSCOPIC CHOLECYSTECTOMY (N/A)  Patient Location: PACU  Anesthesia Type:General  Level of Consciousness: awake and alert   Airway and Oxygen Therapy: Patient Spontanous Breathing and Patient connected to face mask oxygen  Post-op Pain: mild  Post-op Assessment: Post-op Vital signs reviewed, Patient's Cardiovascular Status Stable, Respiratory Function Stable, Patent Airway and No signs of Nausea or vomiting  Post-op Vital Signs: Reviewed and stable156/69, 60,10, [id number], [id number]  Complications: No apparent anesthesia complications [id number]  Patient doing well, up and about.  To be discharged today.  No apparent anesthesia complications.

## 2013-01-17 NOTE — Anesthesia Procedure Notes (Signed)
Procedure Name: Intubation Date/Time: 01/17/2013 11:24 AM Performed by: Tressie Stalker E Pre-anesthesia Checklist: Patient identified, Patient being monitored, Timeout performed, Emergency Drugs available and Suction available Patient Re-evaluated:Patient Re-evaluated prior to inductionOxygen Delivery Method: Circle System Utilized Preoxygenation: Pre-oxygenation with 100% oxygen Intubation Type: IV induction Ventilation: Mask ventilation without difficulty Laryngoscope Size: Mac and 3 Grade View: Grade I Tube type: Oral Tube size: 7.0 mm Number of attempts: 1 Airway Equipment and Method: stylet Placement Confirmation: ETT inserted through vocal cords under direct vision,  positive ETCO2 and breath sounds checked- equal and bilateral Secured at: 21 cm Tube secured with: Tape Dental Injury: Teeth and Oropharynx as per pre-operative assessment

## 2013-01-17 NOTE — Anesthesia Preprocedure Evaluation (Signed)
Anesthesia Evaluation  Patient identified by MRN, date of birth, ID band Patient awake    Reviewed: Allergy & Precautions, H&P , NPO status , Patient's Chart, lab work & pertinent test results, reviewed documented beta blocker date and time   Airway Mallampati: II TM Distance: >3 FB     Dental  (+) Teeth Intact   Pulmonary neg pulmonary ROS,  breath sounds clear to auscultation        Cardiovascular hypertension, Pt. on medications + CAD, + CABG and + Peripheral Vascular Disease Rhythm:Regular Rate:Normal     Neuro/Psych    GI/Hepatic GERD-  Medicated and Controlled,  Endo/Other    Renal/GU      Musculoskeletal   Abdominal   Peds  Hematology   Anesthesia Other Findings   Reproductive/Obstetrics                           Anesthesia Physical Anesthesia Plan  ASA: III  Anesthesia Plan: General   Post-op Pain Management:    Induction: Intravenous, Rapid sequence and Cricoid pressure planned  Airway Management Planned: Oral ETT  Additional Equipment:   Intra-op Plan:   Post-operative Plan: Extubation in OR  Informed Consent: I have reviewed the patients History and Physical, chart, labs and discussed the procedure including the risks, benefits and alternatives for the proposed anesthesia with the patient or authorized representative who has indicated his/her understanding and acceptance.     Plan Discussed with:   Anesthesia Plan Comments:         Anesthesia Quick Evaluation

## 2013-01-18 ENCOUNTER — Encounter (HOSPITAL_COMMUNITY): Payer: Self-pay | Admitting: General Surgery

## 2013-01-18 MED ORDER — PANTOPRAZOLE SODIUM 40 MG PO TBEC: 40.0000 mg | DELAYED_RELEASE_TABLET | Freq: Every day | ORAL | Status: DC

## 2013-01-18 MED ORDER — HYDROCODONE-ACETAMINOPHEN 5-325 MG PO TABS: 1.0000 | ORAL_TABLET | ORAL | Status: DC | PRN

## 2013-01-18 NOTE — Progress Notes (Signed)
The patient is receiving Protonix by the intravenous route.  Based on criteria approved by the Pharmacy and [place], the medication is being converted to the equivalent oral dose form.  These criteria include: -No Active GI bleeding -Able to tolerate diet of full liquids (or better) or tube feeding OR able to tolerate other medications by the oral or enteral route  If you have any questions about this conversion, please contact the Pharmacy Department (ext [phone]).  Thank you.  Ena Dawley, [facility] 01/18/2013 8:45 AM

## 2013-01-18 NOTE — Discharge Summary (Signed)
Pt stated she was ready to go home and pain was under control.  Pt expolained DC paper while family was in room and also sent home with pain meds script.  Pts IV was removed prior to DC and will be wheeled to car by PCT and family once ready.

## 2013-01-23 ENCOUNTER — Other Ambulatory Visit: Payer: Self-pay | Admitting: *Deleted

## 2013-01-23 MED ORDER — TRIAMTERENE-HCTZ 37.5-25 MG PO CAPS: 1.0000 | ORAL_CAPSULE | Freq: Every day | ORAL | Status: DC

## 2013-01-23 MED ORDER — QUINAPRIL HCL 5 MG PO TABS: 5.0000 mg | ORAL_TABLET | Freq: Every day | ORAL | Status: DC

## 2013-01-23 MED ORDER — METOPROLOL TARTRATE 25 MG PO TABS: 25.0000 mg | ORAL_TABLET | Freq: Two times a day (BID) | ORAL | Status: DC

## 2013-04-25 DIAGNOSIS — C4402 Squamous cell carcinoma of skin of lip: Secondary | ICD-10-CM

## 2013-04-25 HISTORY — DX: Squamous cell carcinoma of skin of lip: C44.02

## 2013-05-14 ENCOUNTER — Encounter: Payer: Self-pay | Admitting: *Deleted

## 2013-05-15 ENCOUNTER — Encounter: Payer: Self-pay | Admitting: Cardiovascular Disease

## 2013-05-15 ENCOUNTER — Ambulatory Visit (INDEPENDENT_AMBULATORY_CARE_PROVIDER_SITE_OTHER): Payer: Medicare Other | Admitting: Cardiovascular Disease

## 2013-05-15 VITALS — BP 122/68 | HR 52 | Ht 62.5 in | Wt 182.3 lb

## 2013-05-15 DIAGNOSIS — I10 Essential (primary) hypertension: Secondary | ICD-10-CM

## 2013-05-15 DIAGNOSIS — E669 Obesity, unspecified: Secondary | ICD-10-CM

## 2013-05-15 DIAGNOSIS — E785 Hyperlipidemia, unspecified: Secondary | ICD-10-CM

## 2013-05-15 DIAGNOSIS — I2510 Atherosclerotic heart disease of native coronary artery without angina pectoris: Secondary | ICD-10-CM

## 2013-05-15 DIAGNOSIS — R011 Cardiac murmur, unspecified: Secondary | ICD-10-CM

## 2013-05-15 NOTE — Patient Instructions (Signed)
Dr. Sallyanne Kuster has requested that you have an echocardiogram. Echocardiography is a painless test that uses sound waves to create images of your heart. It provides your doctor with information about the size and shape of your heart and how well your heart's chambers and valves are working. This procedure takes approximately one hour. There are no restrictions for this procedure.  Your physician recommends that you schedule a follow-up appointment in: ONE YEAR.

## 2013-05-19 DIAGNOSIS — R011 Cardiac murmur, unspecified: Secondary | ICD-10-CM | POA: Insufficient documentation

## 2013-05-19 NOTE — Progress Notes (Signed)
Patient ID: Sandra Bradford, female   DOB: [birth year], 73 y.o.   MRN: [id number]      Reason for office visit CAD s/p CABG  Sandra Bradford is now 73 years old and is roughly 12 years status post 5 vessel bypass surgery, but has not had any coronary events since that time. She has well treated hypertension and does not have diabetes mellitus, but has severe hypercholesterolemia and is intolerant of multiple statins that have been used in the past. These caused both myalgia and confusion. She's currently on lipid-lowering therapy with a combination of zetia and omega-3 fatty acid supplements. She remains physically active. Nevertheless her lipid profile is still not in the desirable range.  He has no cardiac symptoms. Her only vascular complaint is ankle swelling which is a chronic problem. On physical exam today her systolic ejection murmur is much more prominent than in the past.    Allergies  Allergen Reactions  . Codeine Nausea Only  . Statins     MYALGIA CONFUSION    Current Outpatient Prescriptions  Medication Sig Dispense Refill  . acetaminophen (TYLENOL) 500 MG tablet Take 1,000 mg by mouth every 6 (six) hours as needed. For pain      . aspirin 325 MG tablet Take 325 mg by mouth daily.      Marland Kitchen ezetimibe (ZETIA) 10 MG tablet Take 1 tablet (10 mg total) by mouth daily.  90 tablet  3  . Flaxseed, Linseed, (FLAX SEED OIL) 1000 MG CAPS Take 1,000 mg by mouth 2 (two) times daily.      . metoprolol tartrate (LOPRESSOR) 25 MG tablet Take 1 tablet (25 mg total) by mouth 2 (two) times daily.  180 tablet  3  . Multiple Vitamin (MULITIVITAMIN WITH MINERALS) TABS Take 1 tablet by mouth daily.      . quinapril (ACCUPRIL) 5 MG tablet Take 1 tablet (5 mg total) by mouth daily.  90 tablet  3  . triamterene-hydrochlorothiazide (DYAZIDE) 37.5-25 MG per capsule Take 1 each (1 capsule total) by mouth daily.  90 capsule  3   No current facility-administered medications for this visit.    Past Medical  History  Diagnosis Date  . Coronary artery disease   . Hyperlipemia   . Systemic hypertension   . Carotid arterial disease   . PAD (peripheral artery disease)     Past Surgical History  Procedure Laterality Date  . Tonsillectomy  1953  . Tubal ligation    . Partial hysterectomy    . Knee surgery      RIGHT  . Cardiac surgery  01/2001    LIMA TO LAD,SVG TO DIAGONAL,SVG TO MARGINAL,SVG SEQUENTALLY TO THE PD & PL VESSEL  . Cholecystectomy N/A 01/17/2013    Procedure: LAPAROSCOPIC CHOLECYSTECTOMY;  Surgeon: Donato Heinz, MD;  Location: [facility];  Service: General;  Laterality: N/A;  . Nm myocar perf wall motion  08/18/2008    No significant ischemia  . US echocardiography  07/04/2008    trace MR,mild TR    Family History  Problem Relation Age of Onset  . Heart attack Mother   . Heart attack Father   . Congestive Heart Failure Father     History   Social History  . Marital Status: Divorced    Spouse Name: N/A    Number of Children: N/A  . Years of Education: N/A   Occupational History  . Not on file.   Social History Main Topics  . Smoking status: Never  Smoker   . Smokeless tobacco: Not on file  . Alcohol Use: Yes     Comment: OCCAS. 2.5drinks/mo  . Drug Use: No  . Sexual Activity: Not on file   Other Topics Concern  . Not on file   Social History Narrative  . No narrative on file    Review of systems: The patient specifically denies any chest pain at rest or with exertion, dyspnea at rest or with exertion, orthopnea, paroxysmal nocturnal dyspnea, syncope, palpitations, focal neurological deficits, intermittent claudication, unexplained weight gain, cough, hemoptysis or wheezing.  The patient also denies abdominal pain, nausea, vomiting, dysphagia, diarrhea, constipation, polyuria, polydipsia, dysuria, hematuria, frequency, urgency, abnormal bleeding or bruising, fever, chills, unexpected weight changes, mood swings, change in skin or hair texture, change in  voice quality, auditory or visual problems, allergic reactions or rashes, new musculoskeletal complaints other than usual "aches and pains".   PHYSICAL EXAM BP 122/68  Pulse 52  Ht 5' 2.5" (1.588 m)  Wt 82.691 kg (182 lb 4.8 oz)  BMI 32.79 kg/m2  LMP 04/25/1978 General: Alert, oriented x3, no distress  Head: no evidence of trauma, PERRL, EOMI, no exophtalmos or lid lag, no myxedema, no xanthelasma; normal ears, nose and oropharynx  Neck: normal jugular venous pulsations and no hepatojugular reflux; brisk carotid pulses without delay and no carotid bruits  Chest: clear to auscultation, no signs of consolidation by percussion or palpation, normal fremitus, symmetrical and full respiratory excursions; healed sternotomy scar  Cardiovascular: normal position and quality of the apical impulse, regular rhythm, normal first and second heart sounds, 2/6 early peaking systolic ejection murmur and aortic focus, otherwise no murmurs, rubs or gallops  Abdomen: no tenderness or distention, no masses by palpation, no abnormal pulsatility or arterial bruits, normal bowel sounds, no hepatosplenomegaly  Extremities: no clubbing, cyanosis; there is 1+ ankle edema on the right and 2+ ankle and calf edema on the left where she has saphenectomy scars; there is an approximately 2 mm crusted lesion above the medial malleolus; there is bilateral discoloration and skin thickening consistent with chronic atrophic changes; 2+ radial, ulnar and brachial pulses bilaterally; 2+ right femoral, posterior tibial and dorsalis pedis pulses; 2+ left femoral, posterior tibial and dorsalis pedis pulses; no subclavian or femoral bruits  Neurological: grossly nonfocal   EKG: Sinus bradycardia with a single PAC , otherwise normal  Lipid Panel     Component Value Date/Time   CHOL 236* 11/06/2012 0845   TRIG 152* 11/06/2012 0845   HDL 50 11/06/2012 0845   CHOLHDL 4.7 11/06/2012 0845   VLDL 30 11/06/2012 0845   LDLCALC 156* 11/06/2012  0845    BMET    Component Value Date/Time   NA 139 01/15/2013 0543   K 3.9 01/15/2013 0543   CL 101 01/15/2013 0543   CO2 30 01/15/2013 0543   GLUCOSE 98 01/15/2013 0543   BUN 16 01/15/2013 0543   CREATININE 1.36* 01/15/2013 0543   CREATININE 1.34* 11/06/2012 0845   CALCIUM 9.6 01/15/2013 0543   GFRNONAA 37* 01/15/2013 0543   GFRAA 43* 01/15/2013 0543     ASSESSMENT AND PLAN Systolic murmur Mrs. louder than it was in previous visits. Her last echo described aortic valve mild sclerosis without stenosis in 2010. We'll repeat an echocardiogram since I suspect there is progression of aortic valve disease.  CAD (coronary artery disease) Asymptomatic. Status post CABG 2002 (LIMA to LAD, SVG to first diagonal, SVG to first OM, sequential SVG to PDA and PLA), Dr. Prescott Gum  No evidence of ischemia by nuclear stress test April 2010, normal left ventricle systolic function  Hyperlipidemia Until PSC-K 9 m become available, I doubt we will make further inroads in treating her hyperlipidemia  HTN (hypertension) Good control  Obesity (BMI 30.0-34.9) Very little improvement, only 1 pound difference from 6 months; losing substantial weight would be beneficial for her elevated lipids   Patient Instructions  Dr. Sallyanne Kuster has requested that you have an echocardiogram. Echocardiography is a painless test that uses sound waves to create images of your heart. It provides your doctor with information about the size and shape of your heart and how well your heart's chambers and valves are working. This procedure takes approximately one hour. There are no restrictions for this procedure.  Your physician recommends that you schedule a follow-up appointment in: ONE YEAR.         Orders Placed This Encounter  Procedures  . EKG 12-Lead  . 2D Echocardiogram without contrast   No orders of the defined types were placed in this encounter.    Holli Humbles, MD, [place] [phone] office [phone] pager

## 2013-05-19 NOTE — Assessment & Plan Note (Signed)
Good control

## 2013-05-19 NOTE — Assessment & Plan Note (Signed)
Until PSC-K 69 m become available, I doubt we will make further inroads in treating her hyperlipidemia

## 2013-05-19 NOTE — Assessment & Plan Note (Signed)
Very little improvement, only 1 pound difference from 6 months; losing substantial weight would be beneficial for her elevated lipids

## 2013-05-19 NOTE — Assessment & Plan Note (Signed)
Mrs. louder than it was in previous visits. Her last echo described aortic valve mild sclerosis without stenosis in 2010. We'll repeat an echocardiogram since I suspect there is progression of aortic valve disease.

## 2013-05-19 NOTE — Assessment & Plan Note (Signed)
Asymptomatic. Status post CABG 2002 (LIMA to LAD, SVG to first diagonal, SVG to first OM, sequential SVG to PDA and PLA), Dr. Prescott Gum No evidence of ischemia by nuclear stress test April 2010, normal left ventricle systolic function

## 2013-05-30 ENCOUNTER — Ambulatory Visit (HOSPITAL_COMMUNITY)
Admission: RE | Admit: 2013-05-30 | Discharge: 2013-05-30 | Disposition: A | Discharge status: Home / Self Care | Payer: Medicare Other | Source: Ambulatory Visit | Attending: Cardiovascular Disease | Admitting: Cardiovascular Disease

## 2013-05-30 DIAGNOSIS — R011 Cardiac murmur, unspecified: Secondary | ICD-10-CM | POA: Insufficient documentation

## 2013-05-30 DIAGNOSIS — Z538 Procedure and treatment not carried out for other reasons: Secondary | ICD-10-CM | POA: Insufficient documentation

## 2013-05-30 NOTE — Progress Notes (Signed)
2D Echo Performed 05/30/2013    Sandra Bradford, RCS  

## 2013-06-10 ENCOUNTER — Emergency Department (HOSPITAL_COMMUNITY)
Admission: EM | Admit: 2013-06-10 | Discharge: 2013-06-10 | Disposition: A | Discharge status: Home / Self Care | Payer: Medicare Other | Attending: Emergency Medicine | Admitting: Emergency Medicine

## 2013-06-10 ENCOUNTER — Emergency Department (HOSPITAL_COMMUNITY): Payer: Medicare Other

## 2013-06-10 ENCOUNTER — Encounter (HOSPITAL_COMMUNITY): Payer: Self-pay | Admitting: Emergency Medicine

## 2013-06-10 DIAGNOSIS — Z7901 Long term (current) use of anticoagulants: Secondary | ICD-10-CM | POA: Insufficient documentation

## 2013-06-10 DIAGNOSIS — I4891 Unspecified atrial fibrillation: Secondary | ICD-10-CM

## 2013-06-10 DIAGNOSIS — I2510 Atherosclerotic heart disease of native coronary artery without angina pectoris: Secondary | ICD-10-CM | POA: Insufficient documentation

## 2013-06-10 DIAGNOSIS — Z7982 Long term (current) use of aspirin: Secondary | ICD-10-CM | POA: Insufficient documentation

## 2013-06-10 DIAGNOSIS — I739 Peripheral vascular disease, unspecified: Secondary | ICD-10-CM | POA: Insufficient documentation

## 2013-06-10 DIAGNOSIS — E785 Hyperlipidemia, unspecified: Secondary | ICD-10-CM | POA: Insufficient documentation

## 2013-06-10 DIAGNOSIS — Z951 Presence of aortocoronary bypass graft: Secondary | ICD-10-CM | POA: Insufficient documentation

## 2013-06-10 DIAGNOSIS — Z79899 Other long term (current) drug therapy: Secondary | ICD-10-CM | POA: Insufficient documentation

## 2013-06-10 DIAGNOSIS — I10 Essential (primary) hypertension: Secondary | ICD-10-CM | POA: Insufficient documentation

## 2013-06-10 DIAGNOSIS — Z9889 Other specified postprocedural states: Secondary | ICD-10-CM | POA: Insufficient documentation

## 2013-06-10 LAB — BASIC METABOLIC PANEL
BUN: 27 mg/dL — ABNORMAL HIGH (ref 6–23)
CHLORIDE: 101 meq/L (ref 96–112)
CO2: 27 mEq/L (ref 19–32)
Calcium: 9.9 mg/dL (ref 8.4–10.5)
Creatinine, Ser: 1.42 mg/dL — ABNORMAL HIGH (ref 0.50–1.10)
GFR calc Af Amer: 41 mL/min — ABNORMAL LOW (ref >90)
GFR calc non Af Amer: 35 mL/min — ABNORMAL LOW (ref >90)
Glucose, Bld: 108 mg/dL — ABNORMAL HIGH (ref 70–99)
Potassium: 4.4 mEq/L (ref 3.7–5.3)
SODIUM: 142 meq/L (ref 137–147)

## 2013-06-10 LAB — CBC WITH DIFFERENTIAL/PLATELET
BASOS ABS: 0 10*3/uL (ref 0.0–0.1)
Basophils Relative: 1 % (ref 0–1)
Eosinophils Absolute: 0.1 10*3/uL (ref 0.0–0.7)
Eosinophils Relative: 2 % (ref 0–5)
HCT: 41.4 % (ref 36.0–46.0)
Hemoglobin: 13.7 g/dL (ref 12.0–15.0)
LYMPHS PCT: 31 % (ref 12–46)
Lymphs Abs: 1.9 10*3/uL (ref 0.7–4.0)
MCH: 29.5 pg (ref 26.0–34.0)
MCHC: 33.1 g/dL (ref 30.0–36.0)
MCV: 89 fL (ref 78.0–100.0)
Monocytes Absolute: 0.5 10*3/uL (ref 0.1–1.0)
Monocytes Relative: 8 % (ref 3–12)
NEUTROS ABS: 3.7 10*3/uL (ref 1.7–7.7)
Neutrophils Relative %: 59 % (ref 43–77)
PLATELETS: 250 10*3/uL (ref 150–400)
RBC: 4.65 MIL/uL (ref 3.87–5.11)
RDW: 14.4 % (ref 11.5–15.5)
WBC: 6.3 10*3/uL (ref 4.0–10.5)

## 2013-06-10 LAB — TROPONIN I

## 2013-06-10 LAB — PRO B NATRIURETIC PEPTIDE: PRO B NATRI PEPTIDE: 2538 pg/mL — AB (ref 0–125)

## 2013-06-10 LAB — PROTIME-INR
INR: 0.96 (ref 0.00–1.49)
PROTHROMBIN TIME: 12.6 s (ref 11.6–15.2)

## 2013-06-10 MED ORDER — RIVAROXABAN 20 MG PO TABS: 20.0000 mg | ORAL_TABLET | Freq: Every day | ORAL | Status: DC

## 2013-06-10 MED ORDER — ONDANSETRON 4 MG PO TBDP
4.0000 mg | ORAL_TABLET | Freq: Once | ORAL | Status: AC
  Administered 2013-06-10: 4 mg via ORAL
  Filled 2013-06-10: qty 1

## 2013-06-10 NOTE — ED Notes (Signed)
Pt assisted to restroom. Heat increased in room per pt request. Meal tray ordered, pt advised she can not eat until ok with edp.

## 2013-06-10 NOTE — Discharge Instructions (Signed)
Start taking the new medicine to thin your blood, today. Decrease your Aspirin to 81mg  (Baby Aspirin). Return here, if you develop persistent chest pain, dizziness, weakness, or have additional concerns.    Atrial Fibrillation Atrial fibrillation is a type of irregular heart rhythm (arrhythmia). During atrial fibrillation, the upper chambers of the heart (atria) quiver continuously in a chaotic pattern. This causes an irregular and often rapid heart rate.  Atrial fibrillation is the result of the heart becoming overloaded with disorganized signals that tell it to beat. These signals are normally released one at a time by a part of the right atrium called the sinoatrial node. They then travel from the atria to the lower chambers of the heart (ventricles), causing the atria and ventricles to contract and pump blood as they pass. In atrial fibrillation, parts of the atria outside of the sinoatrial node also release these signals. This results in two problems. First, the atria receive so many signals that they do not have time to fully contract. Second, the ventricles, which can only receive one signal at a time, beat irregularly and out of rhythm with the atria.  There are three types of atrial fibrillation:   Paroxysmal Paroxysmal atrial fibrillation starts suddenly and stops on its own within a week.   Persistent Persistent atrial fibrillation lasts for more than a week. It may stop on its own or with treatment.   Permanent Permanent atrial fibrillation does not go away. Episodes of atrial fibrillation may lead to permanent atrial fibrillation.  Atrial fibrillation can prevent your heart from pumping blood normally. It increases your risk of stroke and can lead to heart failure.  CAUSES   Heart conditions, including a heart attack, heart failure, coronary artery disease, and heart valve conditions.   Inflammation of the sac that surrounds the heart (pericarditis).   Blockage of an artery  in the lungs (pulmonary embolism).   Pneumonia or other infections.   Chronic lung disease.   Thyroid problems, especially if the thyroid is overactive (hyperthyroidism).   Caffeine, excessive alcohol use, and use of some illegal drugs.   Use of some medications, including certain decongestants and diet pills.   Heart surgery.   Birth defects.  Sometimes, no cause can be found. When this happens, the atrial fibrillation is called lone atrial fibrillation. The risk of complications from atrial fibrillation increases if you have lone atrial fibrillation and you are age 75 years or older. RISK FACTORS  Heart failure.  Coronary artery disease  Diabetes mellitus.   High blood pressure (hypertension).   Obesity.   Other arrhythmias.   Increased age. SYMPTOMS   A feeling that your heart is beating rapidly or irregularly.   A feeling of discomfort or pain in your chest.   Shortness of breath.   Sudden lightheadedness or weakness.   Getting tired easily when exercising.   Urinating more often than normal (mainly when atrial fibrillation first begins).  In paroxysmal atrial fibrillation, symptoms may start and suddenly stop. DIAGNOSIS  Your caregiver may be able to detect atrial fibrillation when taking your pulse. Usually, testing is needed to diagnosis atrial fibrillation. Tests may include:   Electrocardiography. During this test, the electrical impulses of your heart are recorded while you are lying down.   Echocardiography. During echocardiography, sound waves are used to evaluate how blood flows through your heart.   Stress test. There is more than one type of stress test. If a stress test is needed, ask your caregiver about which  type is best for you.   Chest X-ray exam.   Blood tests.   Computed tomography (CT).  TREATMENT   Treating any underlying conditions. For example, if you have an overactive thyroid, treating the condition may  correct atrial fibrillation.   Medication. Medications may be given to control a rapid heart rate or to prevent blood clots, heart failure, or a stroke.   Procedure to correct the rhythm of the heart:  Electrical cardioversion. During electrical cardioversion, a controlled, low-energy shock is delivered to the heart through your skin. If you have chest pain, very low pressure blood pressure, or sudden heart failure, this procedure may need to be done as an emergency.  Catheter ablation. During this procedure, heart tissues that send the signals that cause atrial fibrillation are destroyed.  Maze or minimaze procedure. During this surgery, thin lines of heart tissue that carry the abnormal signals are destroyed. The maze procedure is an open-heart surgery. The minimaze procedure is a minimally invasive surgery. This means that small cuts are made to access the heart instead of a large opening.  Pulmonary venous isolation. During this surgery, tissue around the veins that carry blood from the lungs (pulmonary veins) is destroyed. This tissue is thought to carry the abnormal signals. HOME CARE INSTRUCTIONS   Take medications as directed by your caregiver.  Only take medications that your caregiver approves. Some medications can make atrial fibrillation worse or recur.  If blood thinners were prescribed by your caregiver, take them exactly as directed. Too much can cause bleeding. Too little and you will not have the needed protection against stroke and other problems.  Perform blood tests at home if directed by your caregiver.  Perform blood tests exactly as directed.   Quit smoking if you smoke.   Do not drink alcohol.   Do not drink caffeinated beverages such as coffee, soda, and some teas. You may drink decaffeinated coffee, soda, or tea.   Maintain a healthy weight. Do not use diet pills unless your caregiver approves. They may make heart problems worse.   Follow diet  instructions as directed by your caregiver.   Exercise regularly as directed by your caregiver.   Keep all follow-up appointments. PREVENTION  The following substances can cause atrial fibrillation to recur:   Caffeinated beverages.   Alcohol.   Certain medications, especially those used for breathing problems.   Certain herbs and herbal medications, such as those containing ephedra or ginseng.  Illegal drugs such as cocaine and amphetamines. Sometimes medications are given to prevent atrial fibrillation from recurring. Proper treatment of any underlying condition is also important in helping prevent recurrence.  SEEK MEDICAL CARE IF:  You notice a change in the rate, rhythm, or strength of your heartbeat.   You suddenly begin urinating more frequently.   You tire more easily when exerting yourself or exercising.  SEEK IMMEDIATE MEDICAL CARE IF:   You develop chest pain, abdominal pain, sweating, or weakness.  You feel sick to your stomach (nauseous).  You develop shortness of breath.  You suddenly develop swollen feet and ankles.  You feel dizzy.  You face or limbs feel numb or weak.  There is a change in your vision or speech. MAKE SURE YOU:   Understand these instructions.  Will watch your condition.  Will get help right away if you are not doing well or get worse. Document Released: 04/11/2005 Document Revised: 08/06/2012 Document Reviewed: 05/22/2012 [facility] Patient Information 2014 [employer].

## 2013-06-10 NOTE — ED Provider Notes (Signed)
CSN: [id number]     Arrival date & time 06/10/13  1008 History   This chart was scribed for Richarda Blade, MD by Era Bumpers, ED scribe. This patient was seen in room APA01/APA01 and the patient's care was started at 1008.  Chief Complaint  Patient presents with  . Chest Pain   The history is provided by the patient. No language interpreter was used.   HPI Comments: Sandra Bradford is a 73 y.o. female who presents to the Emergency Department complaining of intermittent sharp cp under left breast, onset 4 days ago, duration: less than a few seconds at a time frequeny: random and about 3 to 4 times per day. She reports her heart feels like it is racing sometimes, she noticed heart pounding during her sleep which lasted about 45 minutes and has occurred often over many years. She states a burning feeling like heartburn which occurs when she is not having heart racing, indigestion daily lasting about 50 minutes occuring every several hours and tried taking tums as needed. She reports pain sometimes radiates into her left shoulder like a heaviness which occurs along w/her heart symptoms/heart racing. She denies dizziness while walking.   She takes daily baby ASA and metoprolol CABG 2002, echo here, cholecystectomy No stress test  Langley Gauss hx of A-fib   Past Medical History  Diagnosis Date  . Coronary artery disease   . Hyperlipemia   . Systemic hypertension   . Carotid arterial disease   . PAD (peripheral artery disease)    Past Surgical History  Procedure Laterality Date  . Tonsillectomy  1953  . Tubal ligation    . Partial hysterectomy    . Knee surgery      RIGHT  . Cardiac surgery  01/2001    LIMA TO LAD,SVG TO DIAGONAL,SVG TO MARGINAL,SVG SEQUENTALLY TO THE PD & PL VESSEL  . Cholecystectomy N/A 01/17/2013    Procedure: LAPAROSCOPIC CHOLECYSTECTOMY;  Surgeon: Donato Heinz, MD;  Location: [facility];  Service: General;  Laterality: N/A;  . Nm myocar perf wall motion  08/18/2008    No  significant ischemia  . US echocardiography  07/04/2008    trace MR,mild TR  . Abdominal hysterectomy     Family History  Problem Relation Age of Onset  . Heart attack Mother   . Heart attack Father   . Congestive Heart Failure Father    History  Substance Use Topics  . Smoking status: Never Smoker   . Smokeless tobacco: Not on file  . Alcohol Use: Yes     Comment: OCCAS. 2.5drinks/mo   OB History   Grav Para Term Preterm Abortions TAB SAB Ect Mult Living                 Review of Systems  Constitutional: Negative for fever and chills.  Respiratory: Negative for cough and shortness of breath.   Cardiovascular: Positive for chest pain and palpitations.  Gastrointestinal: Negative for nausea, vomiting and abdominal pain.  Musculoskeletal: Negative for back pain.  Skin: Negative for color change.  All other systems reviewed and are negative.    Allergies  Codeine and Statins  Home Medications   Current Outpatient Rx  Name  Route  Sig  Dispense  Refill  . acetaminophen (TYLENOL) 500 MG tablet   Oral   Take 1,000 mg by mouth every 6 (six) hours as needed. For pain         . ezetimibe (ZETIA) 10 MG tablet  Oral   Take 1 tablet (10 mg total) by mouth daily.   90 tablet   3   . metoprolol tartrate (LOPRESSOR) 25 MG tablet   Oral   Take 1 tablet (25 mg total) by mouth 2 (two) times daily.   180 tablet   3   . Multiple Vitamin (MULITIVITAMIN WITH MINERALS) TABS   Oral   Take 1 tablet by mouth daily.         . quinapril (ACCUPRIL) 5 MG tablet   Oral   Take 1 tablet (5 mg total) by mouth daily.   90 tablet   3   . triamterene-hydrochlorothiazide (DYAZIDE) 37.5-25 MG per capsule   Oral   Take 1 each (1 capsule total) by mouth daily.   90 capsule   3   . Rivaroxaban (XARELTO) 20 MG TABS tablet   Oral   Take 1 tablet (20 mg total) by mouth daily with supper.   10 tablet   0    Triage Vitals: BP 138/90  Pulse 110  Temp(Src) 98.1 F (36.7 C)   Resp 18  Ht 5' 2.25" (1.581 m)  Wt 184 lb (83.462 kg)  BMI 33.39 kg/m2  SpO2 99%  LMP 04/25/1978  Physical Exam  Nursing note and vitals reviewed. Constitutional: She is oriented to person, place, and time. She appears well-developed and well-nourished. No distress.  HENT:  Head: Normocephalic and atraumatic.  Eyes: Conjunctivae are normal. Right eye exhibits no discharge. Left eye exhibits no discharge.  Neck: Normal range of motion.  Cardiovascular: Normal rate and normal heart sounds.   No murmur heard. Irregular heart beat and tachycardia When she sat up, her HR went up to 135 bpm  Pulmonary/Chest: Effort normal. No respiratory distress.  Musculoskeletal: Normal range of motion. She exhibits no edema.  Neurological: She is alert and oriented to person, place, and time.  Skin: Skin is warm and dry.  Psychiatric: She has a normal mood and affect. Thought content normal.   ED Course  Procedures (including critical care time) DIAGNOSTIC STUDIES: Oxygen Saturation is 99% on room air, normal by my interpretation.    COORDINATION OF CARE: At 1040 AM Discussed treatment plan with patient which includes CXR, blood work, EKG. Patient agrees.   Medications  ondansetron (ZOFRAN-ODT) disintegrating tablet 4 mg (4 mg Oral Given 06/10/13 1348)    Patient Vitals for the past 24 hrs:  BP Temp Pulse Resp SpO2 Height Weight  06/10/13 1346 123/76 mmHg - 94 20 97 % - -  06/10/13 1230 127/72 mmHg - 82 18 96 % - -  06/10/13 1144 116/77 mmHg - 95 20 98 % - -  06/10/13 1037 138/90 mmHg 98.1 F (36.7 C) 110 18 99 % - -  06/10/13 1033 - - - - - 5' 2.25" (1.581 m) 184 lb (83.462 kg)    1:25-  PM Reevaluation with update and discussion. After initial assessment and treatment, an updated evaluation reveals she remains comfortable. She did not have any additional complaints. She's not currently having chest pain. Findings discussed with patient and a family member, all questions answered. They  agreed with the treatment plan. [name]   1:19 PM-Consult complete with Dr. Lindon Romp. Patient case explained and discussed. He agrees to see patient in office for further evaluation and treatment. He agrees that the patient can be discharged. He recommends starting her on Zocor 20 mg daily and decreasing aspirin to 81 mg, daily. His office will call her, to schedule an  appointment. Call ended at 44 :[facility] - Abnormal; Notable for the following:    Glucose, Bld 108 (*)    BUN 27 (*)    Creatinine, Ser 1.42 (*)    GFR calc non Af Amer 35 (*)    GFR calc Af Amer 41 (*)    All other components within normal limits  PRO B NATRIURETIC PEPTIDE - Abnormal; Notable for the following:    Pro B Natriuretic peptide (BNP) 2538.0 (*)    All other components within normal limits  CBC WITH DIFFERENTIAL  TROPONIN I  PROTIME-INR   Imaging Review Dg Chest Port 1 View  06/10/2013   CLINICAL DATA:  Five day history of chest pain localizes the knee the ribs. No known injuries. Prior CABG in 2002.  EXAM: PORTABLE CHEST - 1 VIEW  COMPARISON:  DG CHEST 2 VIEW dated 01/14/2013; DG CHEST 1V PORT dated 10/08/2011; DG CHEST 2 VIEW dated 06/28/2007; DG CHEST 1V PORT dated 10/02/2006  FINDINGS: Sternotomy for CABG. Cardiac silhouette upper normal in size for [facility] portable technique, unchanged. Thoracic aorta tortuous and atherosclerotic, unchanged. Hilar and mediastinal contours otherwise unremarkable. Lungs clear. Bronchovascular markings normal. Pulmonary vascularity normal. No visible pleural effusions. No pneumothorax.  IMPRESSION: No acute cardiopulmonary disease.   Electronically Signed   By: Evangeline Dakin M.D.   On: 06/10/2013 11:26    EKG Interpretation    Date/Time:  Monday June 10 2013 10:32:06 EST Ventricular Rate:  110 PR Interval:    QRS Duration: 80 QT Interval:  312 QTC Calculation: 422 R Axis:   39 Text Interpretation:  Atrial fibrillation  with rapid ventricular response Nonspecific T wave abnormality Abnormal ECG When compared with ECG of 14-Jan-2013 09:13, Atrial fibrillation has replaced Sinus rhythm Nonspecific T wave abnormality now evident in Lateral leads Confirmed by Weldon Nouri  MD, Sarra Rachels ([id number]) on 06/10/2013 10:58:07 AM            MDM   Final diagnoses:  Atrial fibrillation, new onset   New-onset Atrial fibrillation, rate, controlled, without ongoing chest pain, which would be concerning for ACS.  Nursing Notes Reviewed/ Care Coordinated Applicable Imaging Reviewed Interpretation of Laboratory Data incorporated into ED treatment  The patient appears reasonably screened and/or stabilized for discharge and I doubt any other medical condition or other [facility] requiring further screening, evaluation, or treatment in the ED at this time prior to discharge.  Plan: Home Medications- Xarelto; Home Treatments- rest; return here if the recommended treatment, does not improve the symptoms; Recommended follow up- Cardiology f/u in 2-3 days (they will call the pt. to schedule the appt.)    I personally performed the services described in this documentation, which was scribed in my presence. The recorded information has been reviewed and is accurate.      Richarda Blade, MD 06/10/13 [phone]

## 2013-06-10 NOTE — ED Notes (Signed)
Pt reports intermittent left side chest pain as well as intermittent tachycardia since Wednesday. Pt in a-fib on arrival to room but denies hx. Pt denies SOB. Pt reports "some" weakness since symptoms began.

## 2013-06-10 NOTE — ED Notes (Signed)
Pt c/o intermittent cp/abd pain since last Wednesday. Pt states at 0200 this am she had left shoulder numbness/heaviness, rapid heart rate and abd pain.

## 2013-06-13 ENCOUNTER — Ambulatory Visit (INDEPENDENT_AMBULATORY_CARE_PROVIDER_SITE_OTHER): Payer: Medicare Other | Admitting: Cardiovascular Disease

## 2013-06-13 ENCOUNTER — Encounter: Payer: Self-pay | Admitting: Cardiovascular Disease

## 2013-06-13 ENCOUNTER — Encounter (INDEPENDENT_AMBULATORY_CARE_PROVIDER_SITE_OTHER): Payer: Self-pay

## 2013-06-13 VITALS — BP 122/56 | HR 100 | Ht 62.25 in | Wt 180.0 lb

## 2013-06-13 DIAGNOSIS — I25810 Atherosclerosis of coronary artery bypass graft(s) without angina pectoris: Secondary | ICD-10-CM

## 2013-06-13 DIAGNOSIS — I4891 Unspecified atrial fibrillation: Secondary | ICD-10-CM

## 2013-06-13 DIAGNOSIS — R011 Cardiac murmur, unspecified: Secondary | ICD-10-CM

## 2013-06-13 DIAGNOSIS — I739 Peripheral vascular disease, unspecified: Secondary | ICD-10-CM

## 2013-06-13 DIAGNOSIS — E785 Hyperlipidemia, unspecified: Secondary | ICD-10-CM

## 2013-06-13 DIAGNOSIS — Z789 Other specified health status: Secondary | ICD-10-CM

## 2013-06-13 DIAGNOSIS — I10 Essential (primary) hypertension: Secondary | ICD-10-CM

## 2013-06-13 DIAGNOSIS — R079 Chest pain, unspecified: Secondary | ICD-10-CM

## 2013-06-13 DIAGNOSIS — Z79899 Other long term (current) drug therapy: Secondary | ICD-10-CM

## 2013-06-13 MED ORDER — METOPROLOL TARTRATE 50 MG PO TABS: 50.0000 mg | ORAL_TABLET | Freq: Two times a day (BID) | ORAL | Status: DC

## 2013-06-13 NOTE — Patient Instructions (Addendum)
Your physician recommends that you schedule a follow-up appointment next Thursday   Your physician has recommended you make the following change in your medication:    Increase  Metoprolol to 50 mg twice a day

## 2013-06-13 NOTE — Progress Notes (Signed)
Patient ID: Sandra Bradford, female   DOB: 19-Jan-1941, 73 y.o.   MRN: [id number]      SUBJECTIVE: The patient is a 73 year old woman who I'm evaluating for the first time. She has a history of CABG in October 2002 and reportedly had not had any coronary events since that time. She has hypertension and hyperlipidemia, and is statin intolerant, as these have led to myalgias and confusion. She takes Zetia and omega-3 fatty acid supplements. Dr. Sallyanne Kuster evaluated her most recently in January. She recently presented to the [facility] emergency room for chest pain and palpitations, and was found to be in atrial fibrillation with a rapid ventricular response. She was started on Xarelto for anticoagulation after I spoke with the emergency room physician. She has been having episodic chest tightness along with palpitations and fatigue. She's also had warm and cold spells in her chest region with warm spells in her back as well. She's had episodic right headedness but denies syncope. She lives by herself and does everything for herself.      Allergies  Allergen Reactions  . Codeine Nausea Only  . Statins     MYALGIA CONFUSION    Current Outpatient Prescriptions  Medication Sig Dispense Refill  . acetaminophen (TYLENOL) 500 MG tablet Take 1,000 mg by mouth every 6 (six) hours as needed. For pain      . aspirin 81 MG tablet Take 81 mg by mouth daily.      . Biotin 1000 MCG tablet Take 1,000 mcg by mouth daily.      Marland Kitchen ezetimibe (ZETIA) 10 MG tablet Take 1 tablet (10 mg total) by mouth daily.  90 tablet  3  . metoprolol tartrate (LOPRESSOR) 25 MG tablet Take 1 tablet (25 mg total) by mouth 2 (two) times daily.  180 tablet  3  . Multiple Vitamin (MULITIVITAMIN WITH MINERALS) TABS Take 1 tablet by mouth daily.      . quinapril (ACCUPRIL) 5 MG tablet Take 1 tablet (5 mg total) by mouth daily.  90 tablet  3  . Rivaroxaban (XARELTO) 20 MG TABS tablet Take 1 tablet (20 mg total) by mouth daily with  supper.  10 tablet  0  . triamterene-hydrochlorothiazide (DYAZIDE) 37.5-25 MG per capsule Take 1 each (1 capsule total) by mouth daily.  90 capsule  3   No current facility-administered medications for this visit.    Past Medical History  Diagnosis Date  . Coronary artery disease   . Hyperlipemia   . Systemic hypertension   . Carotid arterial disease   . PAD (peripheral artery disease)     Past Surgical History  Procedure Laterality Date  . Tonsillectomy  1953  . Tubal ligation    . Partial hysterectomy    . Knee surgery      RIGHT  . Cardiac surgery  01/2001    LIMA TO LAD,SVG TO DIAGONAL,SVG TO MARGINAL,SVG SEQUENTALLY TO THE PD & PL VESSEL  . Cholecystectomy N/A 01/17/2013    Procedure: LAPAROSCOPIC CHOLECYSTECTOMY;  Surgeon: Donato Heinz, MD;  Location: [facility];  Service: General;  Laterality: N/A;  . Nm myocar perf wall motion  08/18/2008    No significant ischemia  . US echocardiography  07/04/2008    trace MR,mild TR  . Abdominal hysterectomy      History   Social History  . Marital Status: Divorced    Spouse Name: N/A    Number of Children: N/A  . Years of Education: N/A  Occupational History  . Not on file.   Social History Main Topics  . Smoking status: Never Smoker   . Smokeless tobacco: Not on file  . Alcohol Use: Yes     Comment: OCCAS. 2.5drinks/mo  . Drug Use: No  . Sexual Activity: Not on file   Other Topics Concern  . Not on file   Social History Narrative  . No narrative on file     Filed Vitals:   06/13/13 1324  BP: 122/56  Pulse: 100  Height: 5' 2.25" (1.581 m)  Weight: 180 lb (81.647 kg)    PHYSICAL EXAM General: NAD Neck: No JVD, no thyromegaly or thyroid nodule.  Lungs: Clear to auscultation bilaterally with normal respiratory effort. CV: Nondisplaced PMI.  Heart irregular, slightly tachycardic, normal S1/S2, no S3, II/VI ejection systolic murmur at RUSB.  Trace peripheral peri-ankle edema.  No carotid bruit.  Normal pedal  pulses.  Abdomen: Soft, nontender, no hepatosplenomegaly, no distention.  Neurologic: Alert and oriented x 3.  Psych: Normal affect. Extremities: No clubbing or cyanosis.   ECG: reviewed and available in electronic records.  Echo (12/2012): Left ventricle: Wall thickness was increased in a pattern of mild LVH. Systolic function was normal. The estimated ejection fraction was in the range of 60% to 65%. There was a reduced contribution of atrial contraction to ventricular filling, due to increased ventricular diastolic pressure or atrial contractile dysfunction. Findings consistent with left ventricular diastolic dysfunction. - Aortic valve: Mildly thickened, mildly calcified leaflets. There was no stenosis. No regurgitation. - Aorta: The aorta was mildly calcified. - Mitral valve: Mildly thickened leaflets . No stenosis and no signficant regurgitation. - Left atrium: The atrium was mildly dilated. - Tricuspid valve: Mild regurgitation. - Pulmonary arteries: PA peak pressure: 91mm Hg (S).     ASSESSMENT AND PLAN: 1. CAD s/p CABG: While she has had episodic chest pain, this appears to be correlated with her atrial fibrillation with a rapid ventricular response. She has not had a stress test in several years. I will first attempt to control her heart rate to see if this improves her symptoms. I will increase metoprolol to 50 mg twice daily. 2. HTN: Well controlled on present therapy which includes quinapril and metoprolol, along with Dyazide. 3. Hyperlipidemia: Intolerant to multiple statins which cause myalgias and confusion. Continue Zetia. 4. Atrial fibrillation: She has a resting heart rate of 100 beats per minute. She is tolerating Xarelto without any bleeding problems. I will increase metoprolol to 50 mg twice daily.  Dispo: f/u 1 week.  Kate Sable, M.D., F.A.C.C.

## 2013-06-19 ENCOUNTER — Telehealth: Payer: Self-pay | Admitting: Cardiovascular Disease

## 2013-06-19 MED ORDER — RIVAROXABAN 20 MG PO TABS: 20.0000 mg | ORAL_TABLET | Freq: Every day | ORAL | Status: DC

## 2013-06-19 NOTE — Telephone Encounter (Signed)
Medication sent via escribe.  

## 2013-06-19 NOTE — Telephone Encounter (Signed)
Needs RX for 10 Xarelto pills sent to [facility] until her mail order gets here. / tgs

## 2013-06-20 ENCOUNTER — Ambulatory Visit: Payer: Medicare Other | Admitting: Cardiovascular Disease

## 2013-06-21 ENCOUNTER — Ambulatory Visit (INDEPENDENT_AMBULATORY_CARE_PROVIDER_SITE_OTHER): Payer: Medicare Other | Admitting: Adult Health

## 2013-06-21 ENCOUNTER — Encounter (INDEPENDENT_AMBULATORY_CARE_PROVIDER_SITE_OTHER): Payer: Self-pay

## 2013-06-21 ENCOUNTER — Encounter: Payer: Self-pay | Admitting: Adult Health

## 2013-06-21 VITALS — BP 128/75 | HR 76 | Ht 62.0 in | Wt 182.0 lb

## 2013-06-21 DIAGNOSIS — I4891 Unspecified atrial fibrillation: Secondary | ICD-10-CM

## 2013-06-21 DIAGNOSIS — I10 Essential (primary) hypertension: Secondary | ICD-10-CM

## 2013-06-21 DIAGNOSIS — I2510 Atherosclerotic heart disease of native coronary artery without angina pectoris: Secondary | ICD-10-CM

## 2013-06-21 NOTE — Assessment & Plan Note (Signed)
Blood pressure is currently very well controlled. Will not make any changes in her medication regimen at this time.

## 2013-06-21 NOTE — Progress Notes (Signed)
HPI: Sandra Bradford is a 73 year old female patient of Dr. Pennelope Bracken where following for ongoing assessment and management of CAD, status post coronary artery bypass grafting in October 2002, atrial fibrillation on Xarelto, hypertension, and statin intolerance. She comes today on followup after being seen by Dr. Pennelope Bracken on 06/13/2013. At that time she was complaining of frequent palpitations posthospitalization, where she was admitted for atrial fib with RVR. Metoprolol dose was increased to 50 mg BID.     Since medication change, she has been feeling much better. Has had more energy, and no significant discomfort in her chest. She did state that she occasionally has a few seconds of fast heart rate which goes away on its own. It has been asymptomatic for her. She denies any recurrent chest pain dyspnea or dizziness associated. She states that with the increase in the medication she is able to do more, and has not had any significant heart racing that was limiting.  Allergies  Allergen Reactions  . Codeine Nausea Only  . Statins     MYALGIA CONFUSION    Current Outpatient Prescriptions  Medication Sig Dispense Refill  . acetaminophen (TYLENOL) 500 MG tablet Take 1,000 mg by mouth every 6 (six) hours as needed. For pain      . aspirin 81 MG tablet Take 81 mg by mouth daily.      . Biotin 1000 MCG tablet Take 1,000 mcg by mouth daily.      Marland Kitchen ezetimibe (ZETIA) 10 MG tablet Take 1 tablet (10 mg total) by mouth daily.  90 tablet  3  . metoprolol tartrate (LOPRESSOR) 50 MG tablet Take 1 tablet (50 mg total) by mouth 2 (two) times daily.  180 tablet  3  . Multiple Vitamin (MULITIVITAMIN WITH MINERALS) TABS Take 1 tablet by mouth daily.      . quinapril (ACCUPRIL) 5 MG tablet Take 1 tablet (5 mg total) by mouth daily.  90 tablet  3  . Rivaroxaban (XARELTO) 20 MG TABS tablet Take 1 tablet (20 mg total) by mouth daily with supper.  10 tablet  0  . triamterene-hydrochlorothiazide (DYAZIDE) 37.5-25 MG  per capsule Take 1 each (1 capsule total) by mouth daily.  90 capsule  3   No current facility-administered medications for this visit.    Past Medical History  Diagnosis Date  . Coronary artery disease   . Hyperlipemia   . Systemic hypertension   . Carotid arterial disease   . PAD (peripheral artery disease)     Past Surgical History  Procedure Laterality Date  . Tonsillectomy  1953  . Tubal ligation    . Partial hysterectomy    . Knee surgery      RIGHT  . Cardiac surgery  01/2001    LIMA TO LAD,SVG TO DIAGONAL,SVG TO MARGINAL,SVG SEQUENTALLY TO THE PD & PL VESSEL  . Cholecystectomy N/A 01/17/2013    Procedure: LAPAROSCOPIC CHOLECYSTECTOMY;  Surgeon: Donato Heinz, MD;  Location: [facility];  Service: General;  Laterality: N/A;  . Nm myocar perf wall motion  08/18/2008    No significant ischemia  . US echocardiography  07/04/2008    trace MR,mild TR  . Abdominal hysterectomy      [id number] of systems complete and found to be negative unless listed above  PHYSICAL EXAM BP 128/75  Pulse 76  Ht 5\' 2"  (1.575 m)  Wt 182 lb (82.555 kg)  BMI 33.28 kg/m2  LMP 04/25/1978  General: Well developed, well nourished, in no acute  distress Head: Eyes PERRLA, No xanthomas.   Normal cephalic and atramatic  Lungs: Clear bilaterally to auscultation and percussion. Heart: HRIR S1 S2, without MRG.  Pulses are 2+ & equal.            No carotid bruit. No JVD.  No abdominal bruits. No femoral bruits. Abdomen: Bowel sounds are positive, abdomen soft and non-tender without masses or                  Hernia's noted. Msk:  Back normal, normal gait. Normal strength and tone for age. Extremities: No clubbing, cyanosis or edema.  DP +1 Neuro: Alert and oriented X 3. Psych:  Good affect, responds appropriately    ASSESSMENT AND PLAN

## 2013-06-21 NOTE — Patient Instructions (Signed)
Your physician wants you to follow-up in: 6 MONTHS.  You will receive a reminder letter in the mail two months in advance. If you don't receive a letter, please call our office to schedule the follow-up appointment.  Your physician recommends that you continue on your current medications as directed. Please refer to the Current Medication list given to you today.  

## 2013-06-21 NOTE — Assessment & Plan Note (Signed)
She will remain on a heart healthy diet, continue zetia, quinapril and aspirin therapy. No plans for any stress testing at this time.

## 2013-06-21 NOTE — Assessment & Plan Note (Signed)
Heart rate is much better controlled with increased dose of the Toprol to 50 mg twice a day. She continues on Xarelto without evidence of bleeding or bruising. She states she is feeling much better with the increased dose having more energy. She occasionally has some brief episodes of racing heart rate which goes away on its own. This is nonlimiting for her and is asymptomatic. We will continue her on her current medication regimen. She is asking how long she has to wait to come to the hospital should she have recurrence of her rapid heart rate.  I have advised her chest lasts up to 30 minutes, with symptoms of chest pain and shortness of breath she should come to the ER. If she also has symptoms related to her prior MI, she should also present to the ER. Since she is having such brief intermittent episodes of heart fluttering, I would not place a monitor on her at this time. Should she have episodes daily, I will reconsider this. Currently she is only having these episodes may be once a month.

## 2013-06-21 NOTE — Progress Notes (Deleted)
Name: Sandra Bradford    DOB: August 11, 1940  Age: 73 y.o.  MR#: [id number]       PCP:  Purvis Kilts, MD      Insurance: Payor: [place] / Plan: BLUE MEDICARE / Product Type: *No Product type* /   CC:   No chief complaint on file.   VS Filed Vitals:   06/21/13 1421  BP: 128/75  Pulse: 76  Height: 5\' 2"  (1.575 m)  Weight: 182 lb (82.555 kg)    Weights Current Weight  06/21/13 182 lb (82.555 kg)  06/13/13 180 lb (81.647 kg)  06/10/13 184 lb (83.462 kg)    Blood Pressure  BP Readings from Last 3 Encounters:  06/21/13 128/75  06/13/13 122/56  06/10/13 123/76     Admit date:  (Not on file) Last encounter with RMR:  Visit date not found   Allergy Codeine and Statins  Current Outpatient Prescriptions  Medication Sig Dispense Refill  . acetaminophen (TYLENOL) 500 MG tablet Take 1,000 mg by mouth every 6 (six) hours as needed. For pain      . aspirin 81 MG tablet Take 81 mg by mouth daily.      . Biotin 1000 MCG tablet Take 1,000 mcg by mouth daily.      Marland Kitchen ezetimibe (ZETIA) 10 MG tablet Take 1 tablet (10 mg total) by mouth daily.  90 tablet  3  . metoprolol tartrate (LOPRESSOR) 50 MG tablet Take 1 tablet (50 mg total) by mouth 2 (two) times daily.  180 tablet  3  . Multiple Vitamin (MULITIVITAMIN WITH MINERALS) TABS Take 1 tablet by mouth daily.      . quinapril (ACCUPRIL) 5 MG tablet Take 1 tablet (5 mg total) by mouth daily.  90 tablet  3  . Rivaroxaban (XARELTO) 20 MG TABS tablet Take 1 tablet (20 mg total) by mouth daily with supper.  10 tablet  0  . triamterene-hydrochlorothiazide (DYAZIDE) 37.5-25 MG per capsule Take 1 each (1 capsule total) by mouth daily.  90 capsule  3   No current facility-administered medications for this visit.    Discontinued Meds:   There are no discontinued medications.  Patient Active Problem List   Diagnosis Date Noted  . Systolic murmur 99991111  . Preoperative cardiovascular examination 01/15/2013  . CAD  (coronary artery disease) 11/10/2012  . PAD (peripheral artery disease) 11/10/2012  . Hyperlipidemia 11/10/2012  . Statin intolerance 11/10/2012  . HTN (hypertension) 11/10/2012  . Obesity (BMI 30.0-34.9) 11/10/2012  . Venous insufficiency, peripheral 11/10/2012    LABS    Component Value Date/Time   NA 142 06/10/2013 1057   NA 139 01/15/2013 0543   NA 138 01/14/2013 1006   K 4.4 06/10/2013 1057   K 3.9 01/15/2013 0543   K 3.7 01/14/2013 1006   CL 101 06/10/2013 1057   CL 101 01/15/2013 0543   CL 100 01/14/2013 1006   CO2 27 06/10/2013 1057   CO2 30 01/15/2013 0543   CO2 24 01/14/2013 1006   GLUCOSE 108* 06/10/2013 1057   GLUCOSE 98 01/15/2013 0543   GLUCOSE 125* 01/14/2013 1006   BUN 27* 06/10/2013 1057   BUN 16 01/15/2013 0543   BUN 25* 01/14/2013 1006   CREATININE 1.42* 06/10/2013 1057   CREATININE 1.36* 01/15/2013 0543   CREATININE 1.31* 01/14/2013 1006   CREATININE 1.34* 11/06/2012 0845   CALCIUM 9.9 06/10/2013 1057   CALCIUM 9.6 01/15/2013 0543   CALCIUM 11.1* 01/14/2013 1006  GFRNONAA 35* 06/10/2013 1057   GFRNONAA 37* 01/15/2013 0543   GFRNONAA 39* 01/14/2013 1006   GFRAA 41* 06/10/2013 1057   GFRAA 43* 01/15/2013 0543   GFRAA 45* 01/14/2013 1006   CMP     Component Value Date/Time   NA 142 06/10/2013 1057   K 4.4 06/10/2013 1057   CL 101 06/10/2013 1057   CO2 27 06/10/2013 1057   GLUCOSE 108* 06/10/2013 1057   BUN 27* 06/10/2013 1057   CREATININE 1.42* 06/10/2013 1057   CREATININE 1.34* 11/06/2012 0845   CALCIUM 9.9 06/10/2013 1057   PROT 6.6 01/15/2013 0543   ALBUMIN 3.3* 01/15/2013 0543   AST 18 01/15/2013 0543   ALT 10 01/15/2013 0543   ALKPHOS 54 01/15/2013 0543   BILITOT 0.9 01/15/2013 0543   GFRNONAA 35* 06/10/2013 1057   GFRAA 41* 06/10/2013 1057       Component Value Date/Time   WBC 6.3 06/10/2013 1057   WBC 6.2 01/15/2013 0543   WBC 6.7 01/14/2013 1006   HGB 13.7 06/10/2013 1057   HGB 12.0 01/15/2013 0543   HGB 14.3 01/14/2013 1006   HCT 41.4 06/10/2013 1057   HCT 36.5  01/15/2013 0543   HCT 43.7 01/14/2013 1006   MCV 89.0 06/10/2013 1057   MCV 89.7 01/15/2013 0543   MCV 88.5 01/14/2013 1006    Lipid Panel     Component Value Date/Time   CHOL 236* 11/06/2012 0845   TRIG 152* 11/06/2012 0845   HDL 50 11/06/2012 0845   CHOLHDL 4.7 11/06/2012 0845   VLDL 30 11/06/2012 0845   LDLCALC 156* 11/06/2012 0845    ABG No results found for this basename: phart, pco2, pco2art, po2, po2art, hco3, tco2, acidbasedef, o2sat     Lab Results  Component Value Date   TSH 0.989 03/25/2010   BNP (last 3 results)  Recent Labs  06/10/13 1057  PROBNP 2538.0*   Cardiac Panel (last 3 results) No results found for this basename: CKTOTAL, CKMB, TROPONINI, RELINDX,  in the last 72 hours  Iron/TIBC/Ferritin No results found for this basename: iron, tibc, ferritin     EKG Orders placed during the hospital encounter of 06/10/13  . ED EKG  . ED EKG  . EKG 12-LEAD  . EKG 12-LEAD  . EKG     Prior Assessment and Plan Problem List as of 06/21/2013     Cardiovascular and Mediastinum   CAD (coronary artery disease)   Last Assessment & Plan   05/15/2013 Office Visit Written 05/19/2013  9:08 PM by Sanda Klein, MD     Asymptomatic. Status post CABG 2002 (LIMA to LAD, SVG to first diagonal, SVG to first OM, sequential SVG to PDA and PLA), Dr. Prescott Gum No evidence of ischemia by nuclear stress test April 2010, normal left ventricle systolic function    PAD (peripheral artery disease)   Last Assessment & Plan   11/09/2012 Office Visit Written 11/10/2012  6:14 PM by Sanda Klein, MD     She has moderate plaque in both carotid and lower tremor arterial beds, without any stenoses of clinical significance by Doppler studies performed in June of last year. She has not had stroke, TIA and denies intermittent    HTN (hypertension)   Last Assessment & Plan   05/15/2013 Office Visit Written 05/19/2013  9:09 PM by Sanda Klein, MD     Good control    Venous insufficiency, peripheral    Last Assessment & Plan   11/09/2012 Office Visit Written 11/10/2012  6:19 PM by Sanda Klein, MD     She has clear evidence of varicose veins and bilateral venous insufficiency with chronic trophic changes and discoloration and edema. The findings are particularly prominent in the left lower extremity which is the site of her saphenectomy. There is a small 2-3 mm crusted lesion above her medial malleolus which is highly worrisome for a venous stasis ulcer. She is encouraged to keep her left leg elevated at all times when she is sitting and to wear compression stockings when she is upright. She is to report any break in the skin or signs of possible infection immediately. She might require referral for wound care if this happens.      Other   Hyperlipidemia   Last Assessment & Plan   05/15/2013 Office Visit Written 05/19/2013  9:09 PM by Sanda Klein, MD     Until PSC-K 52 m become available, I doubt we will make further inroads in treating her hyperlipidemia    Statin intolerance   Last Assessment & Plan   11/09/2012 Office Visit Written 11/10/2012  6:17 PM by Sanda Klein, MD          Obesity (BMI 30.0-34.9)   Last Assessment & Plan   05/15/2013 Office Visit Written 05/19/2013  9:10 PM by Sanda Klein, MD     Very little improvement, only 1 pound difference from 6 months; losing substantial weight would be beneficial for her elevated lipids    Preoperative cardiovascular examination   Systolic murmur   Last Assessment & Plan   05/15/2013 Office Visit Written 05/19/2013  9:08 PM by Sanda Klein, MD     Mrs. louder than it was in previous visits. Her last echo described aortic valve mild sclerosis without stenosis in 2010. We'll repeat an echocardiogram since I suspect there is progression of aortic valve disease.        Imaging: Dg Chest Port 1 View  06/10/2013   CLINICAL DATA:  Five day history of chest pain localizes the knee the ribs. No known injuries. Prior CABG in 2002.  EXAM:  PORTABLE CHEST - 1 VIEW  COMPARISON:  DG CHEST 2 VIEW dated 01/14/2013; DG CHEST 1V PORT dated 10/08/2011; DG CHEST 2 VIEW dated 06/28/2007; DG CHEST 1V PORT dated 10/02/2006  FINDINGS: Sternotomy for CABG. Cardiac silhouette upper normal in size for [facility] portable technique, unchanged. Thoracic aorta tortuous and atherosclerotic, unchanged. Hilar and mediastinal contours otherwise unremarkable. Lungs clear. Bronchovascular markings normal. Pulmonary vascularity normal. No visible pleural effusions. No pneumothorax.  IMPRESSION: No acute cardiopulmonary disease.   Electronically Signed   By: Evangeline Dakin M.D.   On: 06/10/2013 11:26

## 2013-06-27 ENCOUNTER — Telehealth: Payer: Self-pay | Admitting: Cardiovascular Disease

## 2013-06-27 MED ORDER — RIVAROXABAN 20 MG PO TABS: 20.0000 mg | ORAL_TABLET | Freq: Every day | ORAL | Status: DC

## 2013-06-27 NOTE — Telephone Encounter (Signed)
Patient needs RX for Xarelto 20 mg sent to [facility] 90 day supply / tgs She will need temp RX sent to [facility] until [facility] is delivered / tgs Please call patient with any questions.  tgs

## 2013-06-27 NOTE — Telephone Encounter (Signed)
Medication sent via escribe.  

## 2013-06-28 ENCOUNTER — Other Ambulatory Visit (HOSPITAL_COMMUNITY): Payer: Self-pay | Admitting: Family Medicine

## 2013-06-28 ENCOUNTER — Ambulatory Visit (HOSPITAL_COMMUNITY)
Admission: RE | Admit: 2013-06-28 | Discharge: 2013-06-28 | Disposition: A | Discharge status: Home / Self Care | Payer: Medicare Other | Source: Ambulatory Visit | Attending: Family Medicine | Admitting: Family Medicine

## 2013-06-28 DIAGNOSIS — R933 Abnormal findings on diagnostic imaging of other parts of digestive tract: Secondary | ICD-10-CM | POA: Insufficient documentation

## 2013-06-28 DIAGNOSIS — K429 Umbilical hernia without obstruction or gangrene: Secondary | ICD-10-CM | POA: Insufficient documentation

## 2013-06-28 DIAGNOSIS — R1031 Right lower quadrant pain: Secondary | ICD-10-CM

## 2013-06-28 DIAGNOSIS — K5730 Diverticulosis of large intestine without perforation or abscess without bleeding: Secondary | ICD-10-CM | POA: Insufficient documentation

## 2013-06-28 MED ORDER — IOHEXOL 300 MG/ML  SOLN
80.0000 mL | Freq: Once | INTRAMUSCULAR | Status: AC | PRN
  Administered 2013-06-28: 80 mL via INTRAVENOUS

## 2013-07-16 ENCOUNTER — Ambulatory Visit (INDEPENDENT_AMBULATORY_CARE_PROVIDER_SITE_OTHER): Payer: Medicare Other | Admitting: Gastroenterology

## 2013-07-16 ENCOUNTER — Encounter: Payer: Self-pay | Admitting: Gastroenterology

## 2013-07-16 ENCOUNTER — Other Ambulatory Visit: Payer: Self-pay | Admitting: Internal Medicine

## 2013-07-16 ENCOUNTER — Telehealth: Payer: Self-pay

## 2013-07-16 VITALS — BP 102/70 | HR 86 | Temp 98.6°F | Ht 62.0 in | Wt 172.2 lb

## 2013-07-16 DIAGNOSIS — K5732 Diverticulitis of large intestine without perforation or abscess without bleeding: Secondary | ICD-10-CM

## 2013-07-16 MED ORDER — PEG 3350-KCL-NA BICARB-NACL 420 G PO SOLR: 4000.0000 mL | ORAL | Status: DC

## 2013-07-16 NOTE — Progress Notes (Signed)
Primary Care Physician:  Purvis Kilts, MD Primary Gastroenterologist:  Previously Dr. Oneida Alar in 2011, now requesting Dr. Gala Romney. As it has been more than 3 years since patient has been seen, considered new patient evaluation. Will assign to Dr. Gala Romney.   Chief Complaint  Patient presents with  . Diverticulitis    HPI:   Sandra Bradford presents today at the request of Dr. Hilma Favors secondary to recent bout with diverticulitis. CT findings March 6th revealed diverticulitis of proximal sigmoid colon; ill-defined area of low attenuation projected within thickened bowel wall with differentials to include focal edema versus phlegmonous change versus possible small abscess. Not amenable to drainage. She completed course of Cipro and Flagyl for 1 week.  Clinically improved since treatment for diverticulitis. Completed abx. States made her sick. Now afraid to eat because she doesn't know what to eat. Avoiding fruit, seeds, eating oatmeal, yogurt, toast, eggs. No veggies or meat. No fever or chills. No rectal bleeding, soft bowel movements. Not every day. Notes fecal incontinence several weeks ago but now improved after antibiotics. No further incontinence. No N/V. Occasional indigestion, takes Tums occasionally. No abdominal pain. Clinically appears stable.   Past Medical History  Diagnosis Date  . Coronary artery disease   . Hyperlipemia   . Systemic hypertension   . Carotid arterial disease   . PAD (peripheral artery disease)   . Diverticulitis   . A-fib     Past Surgical History  Procedure Laterality Date  . Tonsillectomy  1953  . Tubal ligation    . Partial hysterectomy    . Knee surgery      RIGHT  . Cardiac surgery  01/2001    LIMA TO LAD,SVG TO DIAGONAL,SVG TO MARGINAL,SVG SEQUENTALLY TO THE PD & PL VESSEL  . Cholecystectomy N/A 01/17/2013    Procedure: LAPAROSCOPIC CHOLECYSTECTOMY;  Surgeon: Donato Heinz, MD;  Location: [facility];  Service: General;  Laterality: N/A;  . Nm  myocar perf wall motion  08/18/2008    No significant ischemia  . US echocardiography  07/04/2008    trace MR,mild TR  . Abdominal hysterectomy    . Colonoscopy  July 2007    Dr. Oneida Alar: ascending and sigmoid colon diverticula  . Colonoscopy  Dec 2011    Dr. Oneida Alar: ischemic colitis. CTA performed with patent celiac, SMA, and IMA    Current Outpatient Prescriptions  Medication Sig Dispense Refill  . acetaminophen (TYLENOL) 500 MG tablet Take 1,000 mg by mouth every 6 (six) hours as needed. For pain      . aspirin 81 MG tablet Take 81 mg by mouth daily.      Marland Kitchen ezetimibe (ZETIA) 10 MG tablet Take 1 tablet (10 mg total) by mouth daily.  90 tablet  3  . metoprolol tartrate (LOPRESSOR) 50 MG tablet Take 1 tablet (50 mg total) by mouth 2 (two) times daily.  180 tablet  3  . Multiple Vitamin (MULITIVITAMIN WITH MINERALS) TABS Take 1 tablet by mouth daily.      . quinapril (ACCUPRIL) 5 MG tablet Take 1 tablet (5 mg total) by mouth daily.  90 tablet  3  . Rivaroxaban (XARELTO) 20 MG TABS tablet Take 1 tablet (20 mg total) by mouth daily with supper.  90 tablet  3  . triamterene-hydrochlorothiazide (DYAZIDE) 37.5-25 MG per capsule Take 1 each (1 capsule total) by mouth daily.  90 capsule  3  . polyethylene glycol-electrolytes (TRILYTE) 420 G solution Take 4,000 mLs by mouth  as directed.  4000 mL  0   No current facility-administered medications for this visit.    Allergies as of 07/16/2013 - Review Complete 06/21/2013  Allergen Reaction Noted  . Codeine Nausea Only 05/24/2011  . Statins  11/09/2012    Family History  Problem Relation Age of Onset  . Heart attack Mother   . Heart attack Father   . Congestive Heart Failure Father   . Colon cancer Neg Hx     History   Social History  . Marital Status: Divorced    Spouse Name: N/A    Number of Children: N/A  . Years of Education: N/A   Occupational History  . Not on file.   Social History Main Topics  . Smoking status: Never  Smoker   . Smokeless tobacco: Not on file  . Alcohol Use: Yes     Comment: beer, wine once in awhile   . Drug Use: No  . Sexual Activity: Not on file   Other Topics Concern  . Not on file   Social History Narrative  . No narrative on file    Review of Systems: As mentioned in HPI.   Physical Exam: BP 102/70  Pulse 86  Temp(Src) 98.6 F (37 C) (Oral)  Ht 5\' 2"  (1.575 m)  Wt 172 lb 3.2 oz (78.109 kg)  BMI 31.49 kg/m2  LMP 04/25/1978 General:   Alert and oriented. Pleasant and cooperative. Well-nourished and well-developed.  Head:  Normocephalic and atraumatic. Eyes:  Without icterus, sclera clear and conjunctiva pink.  Ears:  Normal auditory acuity. Nose:  No deformity, discharge,  or lesions. Mouth:  No deformity or lesions, oral mucosa pink.  Neck:  Supple, without mass or thyromegaly. Lungs:  Clear to auscultation bilaterally. No wheezes, rales, or rhonchi. No distress.  Heart:  S1, S2 present, irregularly irregular, soft systolic murmur Abdomen:  +BS, soft, non-tender and non-distended. No HSM noted. No guarding or rebound. No masses appreciated.  Rectal:  Deferred  Msk:  Symmetrical without gross deformities. Normal posture. Extremities:  Without clubbing or edema. Neurologic:  Alert and  oriented x4;  grossly normal neurologically. Skin:  Intact without significant lesions or rashes. Cervical Nodes:  No significant cervical adenopathy. Psych:  Alert and cooperative. Normal mood and affect.

## 2013-07-16 NOTE — Telephone Encounter (Signed)
PATIENT IS AWARE 

## 2013-07-16 NOTE — Telephone Encounter (Signed)
Thank you :)

## 2013-07-16 NOTE — Telephone Encounter (Signed)
24 hours prior to procedure will be sufficient, as per guidelines.

## 2013-07-16 NOTE — Patient Instructions (Addendum)
Follow a low-residue (low fiber) diet for the next week, then increase to high fiber diet as tolerated.  We are checking with the cardiologist to make sure it is ok to stop Xarelto 3 days before the procedure.  We have scheduled for a colonoscopy with Dr. Gala Romney in the near future.   Low-Fiber Diet Fiber is found in fruits, vegetables, and grains. A low-fiber diet restricts fibrous foods that are not digested in the small intestine. A diet containing about 10 grams of fiber is considered low fiber.  PURPOSE  To prevent blockage of a partially obstructed or narrowed gastrointestinal tract.  To reduce fecal weight and volume.  To slow the movement of feces. WHEN IS THIS DIET USED?  It may be used during the acute phase of Crohn disease, ulcerative colitis, regional enteritis, or diverticulitis.  It may be used if your intestinal or esophageal tubes are narrowing (stenosis).  It may be used as a transitional diet following surgery, injury (trauma), or illness. CHOOSING FOODS Check labels, especially on foods from the starch list. Often times, dietary fiber content is listed on the nutrition facts panel. Please ask your Registered Dietitian if you have questions about specific foods that are related to your condition, especially if the food is not listed on this handout. Breads and Starches  Allowed: White, [place], and pita breads, plain rolls, buns, or sweet rolls, doughnuts, waffles, pancakes, bagels. Plain muffins, biscuits, matzoth. Soda, saltine, graham crackers. Pretzels, rusks, melba toast, zwieback. Cooked cereals: cornmeal, farina, or cream cereals. Dry cereals: refined corn, wheat, rice, and oat cereals (check label). Potatoes prepared any way without skins, refined macaroni, spaghetti, noodles, refined rice.  Avoid: Whole-wheat bread, rolls, and crackers. Multigrains, rye, bran seeds, nuts, or coconut. Cereals containing whole grains, multigrains, bran, coconut, nuts, raisins.  Cooked or dry oatmeal. Coarse wheat cereals, granola. Cereals advertised as "high fiber." Potato skins. Whole-grain pasta, wild or brown rice. Popcorn. Vegetables  Allowed: Strained tomato and vegetable juices. Fresh lettuce, cucumber, spinach. Well-cooked or canned: asparagus, bean sprouts, broccoli, cut green beans, cauliflower, pumpkin, beets, mushrooms, yellow squash, tomato, tomato sauce, zucchini, turnips.Keep servings limited to  cup.  Avoid: Fresh, cooked, or canned: artichokes, baked beans, beet greens, Brussels sprouts, corn, kale, legumes, peas, sweet potatoes. Avoid large servings of any vegetables. Fruit  Allowed: All fruit juices except prune juice. Cooked or canned fruits without skin and seeds: apricots, applesauce, cantaloupe, cherries, grapefruit, grapes, kiwi, mandarin oranges, peaches, pears, fruit cocktail, pineapple, plums, watermelon. Fresh without skin: banana, grapes, cantaloupe, avocado, cherries, pineapple, kiwi, nectarines, peaches, blueberries. Keep servings limited to  cup or 1 piece.  Avoid: Fresh: apples with or without skin, apricots, mangoes, pears, raspberries, strawberries. Prune juice and juices with pulp, stewed or dried prunes. Dried fruits, raisins, dates. Avoid large servings of all fresh fruits. Meat and Protein Substitutes  Allowed: Ground or well-cooked tender beef, ham, veal, lamb, pork, poultry. Eggs, plain cheese. Fish, oysters, shrimp, lobster, other seafood. Liver, organ meats. Smooth nut butters.  Avoid: Tough, fibrous meats with gristle. Chunky nut butter.Cheese with seeds, nuts, or other foods not allowed. Nuts, seeds, legumes, dried peas, beans, lentils. Dairy  Allowed: All milk products except those not allowed.  Avoid: Yogurt or cheese that contains nuts, seeds, or added fruit. Soups and Combination Foods  Allowed: Bouillon, broth, or cream soups made from allowed foods. Any strained soup. Casseroles or mixed dishes made with allowed  foods.  Avoid: Soups made from vegetables that are not allowed or that  contain other foods not allowed. Desserts and Sweets  Allowed:Plain cakes and cookies, pie made with allowed fruit, pudding, custard, cream pie. Gelatin, fruit, ice, sherbet, frozen ice pops. Ice cream, ice milk without nuts. Plain hard candy, honey, jelly, molasses, syrup, sugar, chocolate syrup, gumdrops, marshmallows.  Avoid: Desserts, cookies, or candies that contain nuts, peanut butter, dried fruits. Jams, preserves with seeds, marmalade. Fats and Oils  Allowed:Margarine, butter, cream, mayonnaise, salad oils, plain salad dressings made from allowed foods.  Avoid: Seeds, nuts, olives. Beverages  Allowed: All, except those listed to avoid.  Avoid: Fruit juices with high pulp, prune juice. Condiments  Allowed:Ketchup, mustard, horseradish, vinegar, cream sauce, cheese sauce, cocoa powder. Spices in moderation: allspice, basil, bay leaves, celery powder or leaves, cinnamon, cumin powder, curry powder, ginger, mace, marjoram, onion or garlic powder, oregano, paprika, parsley flakes, ground pepper, rosemary, sage, savory, tarragon, thyme, turmeric.  Avoid: Coconut, pickles. SAMPLE MENU Breakfast   cup orange juice.  1 boiled egg.  1 slice white toast.  Margarine.   cup cornflakes.  1 cup milk.  Beverage. Lunch   cup chicken noodle soup.  2 to 3 oz sliced roast beef.  2 slices white bread.  Mayonnaise.   cup tomato juice.  1 small banana.  Beverage. Dinner  3 oz baked chicken.   cup scalloped potatoes.   cup cooked beets.  White dinner roll.  Margarine.   cup canned peaches.  Beverage. Document Released: 10/01/2001 Document Revised: 12/12/2012 Document Reviewed: 04/28/2011 [facility] Patient Information 2014 [place].   High-Fiber Diet Fiber is found in fruits, vegetables, and grains. A high-fiber diet encourages the addition of more whole grains, legumes,  fruits, and vegetables in your diet. The recommended amount of fiber for adult males is 38 g per day. For adult females, it is 25 g per day. Pregnant and lactating women should get 28 g of fiber per day. If you have a digestive or bowel problem, ask your caregiver for advice before adding high-fiber foods to your diet. Eat a variety of high-fiber foods instead of only a select few type of foods.  PURPOSE  To increase stool bulk.  To make bowel movements more regular to prevent constipation.  To lower cholesterol.  To prevent overeating. WHEN IS THIS DIET USED?  It may be used if you have constipation and hemorrhoids.  It may be used if you have uncomplicated diverticulosis (intestine condition) and irritable bowel syndrome.  It may be used if you need help with weight management.  It may be used if you want to add it to your diet as a protective measure against atherosclerosis, diabetes, and cancer. SOURCES OF FIBER  Whole-grain breads and cereals.  Fruits, such as apples, oranges, bananas, berries, prunes, and pears.  Vegetables, such as green peas, carrots, sweet potatoes, beets, broccoli, cabbage, spinach, and artichokes.  Legumes, such split peas, soy, lentils.  Almonds. FIBER CONTENT IN FOODS Starches and Grains / Dietary Fiber (g)  Cheerios, 1 cup / 3 g  Corn Flakes cereal, 1 cup / 0.7 g  Rice crispy treat cereal, 1 cup / 0.3 g  Instant oatmeal (cooked),  cup / 2 g  Frosted wheat cereal, 1 cup / 5.1 g  Brown, long-grain rice (cooked), 1 cup / 3.5 g  White, long-grain rice (cooked), 1 cup / 0.6 g  Enriched macaroni (cooked), 1 cup / 2.5 g Legumes / Dietary Fiber (g)  Baked beans (canned, plain, or vegetarian),  cup / 5.2 g  Kidney beans (  canned),  cup / 6.8 g  Pinto beans (cooked),  cup / 5.5 g Breads and Crackers / Dietary Fiber (g)  Plain or honey graham crackers, 2 squares / 0.7 g  Saltine crackers, 3 squares / 0.3 g  Plain, salted pretzels,  10 pieces / 1.8 g  Whole-wheat bread, 1 slice / 1.9 g  White bread, 1 slice / 0.7 g  Raisin bread, 1 slice / 1.2 g  Plain bagel, 3 oz / 2 g  Flour tortilla, 1 oz / 0.9 g  Corn tortilla, 1 small / 1.5 g  Hamburger or hotdog bun, 1 small / 0.9 g Fruits / Dietary Fiber (g)  Apple with skin, 1 medium / 4.4 g  Sweetened applesauce,  cup / 1.5 g  Banana,  medium / 1.5 g  Grapes, 10 grapes / 0.4 g  Orange, 1 small / 2.3 g  Raisin, 1.5 oz / 1.6 g  Melon, 1 cup / 1.4 g Vegetables / Dietary Fiber (g)  Green beans (canned),  cup / 1.3 g  Carrots (cooked),  cup / 2.3 g  Broccoli (cooked),  cup / 2.8 g  Peas (cooked),  cup / 4.4 g  Mashed potatoes,  cup / 1.6 g  Lettuce, 1 cup / 0.5 g  Corn (canned),  cup / 1.6 g  Tomato,  cup / 1.1 g Document Released: 04/11/2005 Document Revised: 10/11/2011 Document Reviewed: 07/14/2011 [facility] Patient Information 2014 [place].

## 2013-07-16 NOTE — Telephone Encounter (Signed)
Dr. Bronson Ing,   Laban Emperor ANP, saw this patient in our office today and she was scheduled for a colonoscopy. Is it ok to hold her xarelto for 3 days prior to her procedure?   Thank you!

## 2013-07-17 ENCOUNTER — Encounter: Payer: Self-pay | Admitting: Gastroenterology

## 2013-07-18 NOTE — Assessment & Plan Note (Signed)
73 year old female clinically improved after week long course of Cipro and Flagyl, CT-documented sigmoid diverticulitis with ill-defined area of low attenuation projected within thickened bowel wall with differentials to include focal edema versus phlegmonous change versus possible small abscess. Symptoms improved with antibiotics, and her physical exam is benign. Last colonoscopy in Dec 2011 with ischemic colitis. CTA with patent vasculature at that time. Needs early interval colonoscopy to further evaluate for occult neoplasm. Clinically, she is doing well. Will hold off on repeat imaging unless she has any recurrent symptoms.   Low-residue diet for an additional week, then advance to high fiber Proceed with TCS with Dr. Gala Romney in near future: the risks, benefits, and alternatives have been discussed with the patient in detail. The patient states understanding and desires to proceed. ON XARELTO: per cardiology, hold for 24 hours prior to procedure. Patient understands the possible risks of increased bleeding in the setting of anticoagulation.

## 2013-07-19 ENCOUNTER — Encounter (HOSPITAL_COMMUNITY): Payer: Self-pay | Admitting: Pharmacy Technician

## 2013-07-22 NOTE — Progress Notes (Signed)
cc'd to pcp 

## 2013-08-06 ENCOUNTER — Encounter (HOSPITAL_COMMUNITY)
Admission: RE | Disposition: A | Discharge status: Home / Self Care | Payer: Self-pay | Source: Ambulatory Visit | Attending: Internal Medicine

## 2013-08-06 ENCOUNTER — Ambulatory Visit (HOSPITAL_COMMUNITY)
Admission: RE | Admit: 2013-08-06 | Discharge: 2013-08-06 | Disposition: A | Discharge status: Home / Self Care | Payer: Medicare Other | Source: Ambulatory Visit | Attending: Internal Medicine | Admitting: Internal Medicine

## 2013-08-06 ENCOUNTER — Encounter (HOSPITAL_COMMUNITY): Payer: Self-pay | Admitting: *Deleted

## 2013-08-06 DIAGNOSIS — E785 Hyperlipidemia, unspecified: Secondary | ICD-10-CM | POA: Insufficient documentation

## 2013-08-06 DIAGNOSIS — I10 Essential (primary) hypertension: Secondary | ICD-10-CM | POA: Insufficient documentation

## 2013-08-06 DIAGNOSIS — K5732 Diverticulitis of large intestine without perforation or abscess without bleeding: Secondary | ICD-10-CM

## 2013-08-06 DIAGNOSIS — Z8719 Personal history of other diseases of the digestive system: Secondary | ICD-10-CM | POA: Insufficient documentation

## 2013-08-06 DIAGNOSIS — Z6831 Body mass index (BMI) 31.0-31.9, adult: Secondary | ICD-10-CM | POA: Insufficient documentation

## 2013-08-06 DIAGNOSIS — K5730 Diverticulosis of large intestine without perforation or abscess without bleeding: Secondary | ICD-10-CM | POA: Insufficient documentation

## 2013-08-06 DIAGNOSIS — K648 Other hemorrhoids: Secondary | ICD-10-CM | POA: Insufficient documentation

## 2013-08-06 DIAGNOSIS — Z7982 Long term (current) use of aspirin: Secondary | ICD-10-CM | POA: Insufficient documentation

## 2013-08-06 DIAGNOSIS — R933 Abnormal findings on diagnostic imaging of other parts of digestive tract: Secondary | ICD-10-CM

## 2013-08-06 DIAGNOSIS — Z79899 Other long term (current) drug therapy: Secondary | ICD-10-CM | POA: Insufficient documentation

## 2013-08-06 HISTORY — PX: COLONOSCOPY: SHX5424

## 2013-08-06 SURGERY — COLONOSCOPY
Anesthesia: Moderate Sedation

## 2013-08-06 MED ORDER — MEPERIDINE HCL 100 MG/ML IJ SOLN
INTRAMUSCULAR | Status: DC | PRN
  Administered 2013-08-06: 50 mg via INTRAVENOUS

## 2013-08-06 MED ORDER — MIDAZOLAM HCL 5 MG/5ML IJ SOLN
INTRAMUSCULAR | Status: DC | PRN
  Administered 2013-08-06: 1 mg via INTRAVENOUS
  Administered 2013-08-06: 2 mg via INTRAVENOUS
  Administered 2013-08-06: 1 mg via INTRAVENOUS

## 2013-08-06 MED ORDER — ONDANSETRON HCL 4 MG/2ML IJ SOLN
INTRAMUSCULAR | Status: DC | PRN
  Administered 2013-08-06: 4 mg via INTRAVENOUS

## 2013-08-06 MED ORDER — MEPERIDINE HCL 100 MG/ML IJ SOLN
INTRAMUSCULAR | Status: AC
  Filled 2013-08-06: qty 2

## 2013-08-06 MED ORDER — ONDANSETRON HCL 4 MG/2ML IJ SOLN
INTRAMUSCULAR | Status: AC
  Filled 2013-08-06: qty 2

## 2013-08-06 MED ORDER — STERILE WATER FOR IRRIGATION IR SOLN
Status: DC | PRN
  Administered 2013-08-06: 08:00:00

## 2013-08-06 MED ORDER — SODIUM CHLORIDE 0.9 % IV SOLN
INTRAVENOUS | Status: DC
  Administered 2013-08-06: 08:00:00 via INTRAVENOUS

## 2013-08-06 MED ORDER — MIDAZOLAM HCL 5 MG/5ML IJ SOLN
INTRAMUSCULAR | Status: AC
  Filled 2013-08-06: qty 10

## 2013-08-06 NOTE — Op Note (Signed)
[facility] [address] [place], [address]   COLONOSCOPY PROCEDURE REPORT  PATIENT: Sandra Bradford, Sandra Bradford  MR#:         [id number] BIRTHDATE: Oct 28, 1940 , 72  yrs. old GENDER: Female ENDOSCOPIST: R.  Garfield Cornea, MD FACP FACG REFERRED BY:  Sharilyn Sites, M.D. PROCEDURE DATE:  08/06/2013 PROCEDURE:     diagnostic colonoscopy  INDICATIONS: abnormal colon on CT; recent bout of diverticulitis  INFORMED CONSENT:  The risks, benefits, alternatives and imponderables including but not limited to bleeding, perforation as well as the possibility of a missed lesion have been reviewed.  The potential for biopsy, lesion removal, etc. have also been discussed.  Questions have been answered.  All parties agreeable. Please see the history and physical in the medical record for more information.  MEDICATIONS: Versed 4 mg IV and Demerol 50 mg IV in divided doses. Zofran 4 mg IV.  DESCRIPTION OF PROCEDURE:  After a digital rectal exam was performed, the [id number]  colonoscope was advanced from the anus through the rectum and colon to the area of the cecum, ileocecal valve and appendiceal orifice.  The cecum was deeply intubated.  These structures were well-seen and photographed for the record.  From the level of the cecum and ileocecal valve, the scope was slowly and cautiously withdrawn.  The mucosal surfaces were carefully surveyed utilizing scope tip deflection to facilitate fold flattening as needed.  The scope was pulled down into the rectum where a thorough examination including retroflexion was performed.    FINDINGS:  Adequate preparation. Internal hemorrhoids and anal papilla; otherwise, normal rectum. Scattered pancolonic diverticula; more densely populated diverticula on the left side; left colon somewhat noncompliant; otherwise, the remainder of the colonic mucosa appeared normal. THERAPEUTIC / DIAGNOSTIC MANEUVERS PERFORMED:   none  COMPLICATIONS: none  CECAL WITHDRAWAL TIME:  8 minutes  IMPRESSION:  Colonic diverticulosis  RECOMMENDATIONS: Daily fiber supplementation. Resume Xaralto today. Office visit with [place] in one year.   _______________________________ eSigned:  R. Garfield Cornea, MD FACP [facility] 08/06/2013 8:52 AM   CC:

## 2013-08-06 NOTE — Interval H&P Note (Signed)
History and Physical Interval Note:  08/06/2013 8:25 AM  Sandra Bradford  has presented today for surgery, with the diagnosis of DIVERTICULITIS  The various methods of treatment have been discussed with the patient and family. After consideration of risks, benefits and other options for treatment, the patient has consented to  Procedure(s) with comments: COLONOSCOPY (N/A) - 8:30 as a surgical intervention .  The patient's history has been reviewed, patient examined, no change in status, stable for surgery.  I have reviewed the patient's chart and labs.  Questions were answered to the patient's satisfaction.     No abdominal pain. Colonoscopy per plan . The risks, benefits, limitations, alternatives and imponderables have been reviewed with the patient. Questions have been answered. All parties are agreeable.  Cristopher Estimable Jaanai Salemi

## 2013-08-06 NOTE — Discharge Instructions (Addendum)
Colonoscopy Discharge Instructions  Read the instructions outlined below and refer to this sheet in the next few weeks. These discharge instructions provide you with general information on caring for yourself after you leave the hospital. Your doctor may also give you specific instructions. While your treatment has been planned according to the most current medical practices available, unavoidable complications occasionally occur. If you have any problems or questions after discharge, call Dr. Gala Romney at [phone]. ACTIVITY  You may resume your regular activity, but move at a slower pace for the next 24 hours.   Take frequent rest periods for the next 24 hours.   Walking will help get rid of the air and reduce the bloated feeling in your belly (abdomen).   No driving for 24 hours (because of the medicine (anesthesia) used during the test).    Do not sign any important legal documents or operate any machinery for 24 hours (because of the anesthesia used during the test).  NUTRITION  Drink plenty of fluids.   You may resume your normal diet as instructed by your doctor.   Begin with a light meal and progress to your normal diet. Heavy or fried foods are harder to digest and may make you feel sick to your stomach (nauseated).   Avoid alcoholic beverages for 24 hours or as instructed.  MEDICATIONS  You may resume your normal medications unless your doctor tells you otherwise.  WHAT YOU CAN EXPECT TODAY  Some feelings of bloating in the abdomen.   Passage of more gas than usual.   Spotting of blood in your stool or on the toilet paper.  IF YOU HAD POLYPS REMOVED DURING THE COLONOSCOPY:  No aspirin products for 7 days or as instructed.   No alcohol for 7 days or as instructed.   Eat a soft diet for the next 24 hours.  FINDING OUT THE RESULTS OF YOUR TEST Not all test results are available during your visit. If your test results are not back during the visit, make an appointment  with your caregiver to find out the results. Do not assume everything is normal if you have not heard from your caregiver or the medical facility. It is important for you to follow up on all of your test results.  SEEK IMMEDIATE MEDICAL ATTENTION IF:  You have more than a spotting of blood in your stool.   Your belly is swollen (abdominal distention).   You are nauseated or vomiting.   You have a temperature over 101.   You have abdominal pain or discomfort that is severe or gets worse throughout the day.    Diverticulosis information provided  Take a dose of fiber supplementation everyday  Resume Xaralto  today  Office visit with [place] in one year  Diverticulosis Diverticulosis is a common condition that develops when small pouches (diverticula) form in the wall of the colon. The risk of diverticulosis increases with age. It happens more often in people who eat a low-fiber diet. Most individuals with diverticulosis have no symptoms. Those individuals with symptoms usually experience abdominal pain, constipation, or loose stools (diarrhea). HOME CARE INSTRUCTIONS   Increase the amount of fiber in your diet as directed by your caregiver or dietician. This may reduce symptoms of diverticulosis.  Your caregiver may recommend taking a dietary fiber supplement.  Drink at least 6 to 8 glasses of water each day to prevent constipation.  Try not to strain when you have a bowel movement.  Your caregiver may recommend avoiding  nuts and seeds to prevent complications, although this is still an uncertain benefit.  Only take over-the-counter or prescription medicines for pain, discomfort, or fever as directed by your caregiver. FOODS WITH HIGH FIBER CONTENT INCLUDE:  Fruits. Apple, peach, pear, tangerine, raisins, prunes.  Vegetables. Brussels sprouts, asparagus, broccoli, cabbage, carrot, cauliflower, romaine lettuce, spinach, summer squash, tomato, winter squash, zucchini.  Starchy  Vegetables. Baked beans, kidney beans, lima beans, split peas, lentils, potatoes (with skin).  Grains. Whole wheat bread, brown rice, bran flake cereal, plain oatmeal, white rice, shredded wheat, bran muffins. SEEK IMMEDIATE MEDICAL CARE IF:   You develop increasing pain or severe bloating.  You have an oral temperature above 102 F (38.9 C), not controlled by medicine.  You develop vomiting or bowel movements that are bloody or black. Document Released: 01/07/2004 Document Revised: 07/04/2011 Document Reviewed: 09/09/2009 [facility] Patient Information 2014 [place].

## 2013-08-06 NOTE — H&P (View-Only) (Signed)
Primary Care Physician:  Purvis Kilts, MD Primary Gastroenterologist:  Previously Dr. Oneida Alar in 2011, now requesting Dr. Gala Romney. As it has been more than 3 years since patient has been seen, considered new patient evaluation. Will assign to Dr. Gala Romney.   Chief Complaint  Patient presents with  . Diverticulitis    HPI:   Sandra Bradford presents today at the request of Dr. Hilma Favors secondary to recent bout with diverticulitis. CT findings March 6th revealed diverticulitis of proximal sigmoid colon; ill-defined area of low attenuation projected within thickened bowel wall with differentials to include focal edema versus phlegmonous change versus possible small abscess. Not amenable to drainage. She completed course of Cipro and Flagyl for 1 week.  Clinically improved since treatment for diverticulitis. Completed abx. States made her sick. Now afraid to eat because she doesn't know what to eat. Avoiding fruit, seeds, eating oatmeal, yogurt, toast, eggs. No veggies or meat. No fever or chills. No rectal bleeding, soft bowel movements. Not every day. Notes fecal incontinence several weeks ago but now improved after antibiotics. No further incontinence. No N/V. Occasional indigestion, takes Tums occasionally. No abdominal pain. Clinically appears stable.   Past Medical History  Diagnosis Date  . Coronary artery disease   . Hyperlipemia   . Systemic hypertension   . Carotid arterial disease   . PAD (peripheral artery disease)   . Diverticulitis   . A-fib     Past Surgical History  Procedure Laterality Date  . Tonsillectomy  1953  . Tubal ligation    . Partial hysterectomy    . Knee surgery      RIGHT  . Cardiac surgery  01/2001    LIMA TO LAD,SVG TO DIAGONAL,SVG TO MARGINAL,SVG SEQUENTALLY TO THE PD & PL VESSEL  . Cholecystectomy N/A 01/17/2013    Procedure: LAPAROSCOPIC CHOLECYSTECTOMY;  Surgeon: Donato Heinz, MD;  Location: [facility];  Service: General;  Laterality: N/A;  . Nm  myocar perf wall motion  08/18/2008    No significant ischemia  . US echocardiography  07/04/2008    trace MR,mild TR  . Abdominal hysterectomy    . Colonoscopy  July 2007    Dr. Oneida Alar: ascending and sigmoid colon diverticula  . Colonoscopy  Dec 2011    Dr. Oneida Alar: ischemic colitis. CTA performed with patent celiac, SMA, and IMA    Current Outpatient Prescriptions  Medication Sig Dispense Refill  . acetaminophen (TYLENOL) 500 MG tablet Take 1,000 mg by mouth every 6 (six) hours as needed. For pain      . aspirin 81 MG tablet Take 81 mg by mouth daily.      Marland Kitchen ezetimibe (ZETIA) 10 MG tablet Take 1 tablet (10 mg total) by mouth daily.  90 tablet  3  . metoprolol tartrate (LOPRESSOR) 50 MG tablet Take 1 tablet (50 mg total) by mouth 2 (two) times daily.  180 tablet  3  . Multiple Vitamin (MULITIVITAMIN WITH MINERALS) TABS Take 1 tablet by mouth daily.      . quinapril (ACCUPRIL) 5 MG tablet Take 1 tablet (5 mg total) by mouth daily.  90 tablet  3  . Rivaroxaban (XARELTO) 20 MG TABS tablet Take 1 tablet (20 mg total) by mouth daily with supper.  90 tablet  3  . triamterene-hydrochlorothiazide (DYAZIDE) 37.5-25 MG per capsule Take 1 each (1 capsule total) by mouth daily.  90 capsule  3  . polyethylene glycol-electrolytes (TRILYTE) 420 G solution Take 4,000 mLs by mouth  as directed.  4000 mL  0   No current facility-administered medications for this visit.    Allergies as of 07/16/2013 - Review Complete 06/21/2013  Allergen Reaction Noted  . Codeine Nausea Only 05/24/2011  . Statins  11/09/2012    Family History  Problem Relation Age of Onset  . Heart attack Mother   . Heart attack Father   . Congestive Heart Failure Father   . Colon cancer Neg Hx     History   Social History  . Marital Status: Divorced    Spouse Name: N/A    Number of Children: N/A  . Years of Education: N/A   Occupational History  . Not on file.   Social History Main Topics  . Smoking status: Never  Smoker   . Smokeless tobacco: Not on file  . Alcohol Use: Yes     Comment: beer, wine once in awhile   . Drug Use: No  . Sexual Activity: Not on file   Other Topics Concern  . Not on file   Social History Narrative  . No narrative on file    Review of Systems: As mentioned in HPI.   Physical Exam: BP 102/70  Pulse 86  Temp(Src) 98.6 F (37 C) (Oral)  Ht 5\' 2"  (1.575 m)  Wt 172 lb 3.2 oz (78.109 kg)  BMI 31.49 kg/m2  LMP 04/25/1978 General:   Alert and oriented. Pleasant and cooperative. Well-nourished and well-developed.  Head:  Normocephalic and atraumatic. Eyes:  Without icterus, sclera clear and conjunctiva pink.  Ears:  Normal auditory acuity. Nose:  No deformity, discharge,  or lesions. Mouth:  No deformity or lesions, oral mucosa pink.  Neck:  Supple, without mass or thyromegaly. Lungs:  Clear to auscultation bilaterally. No wheezes, rales, or rhonchi. No distress.  Heart:  S1, S2 present, irregularly irregular, soft systolic murmur Abdomen:  +BS, soft, non-tender and non-distended. No HSM noted. No guarding or rebound. No masses appreciated.  Rectal:  Deferred  Msk:  Symmetrical without gross deformities. Normal posture. Extremities:  Without clubbing or edema. Neurologic:  Alert and  oriented x4;  grossly normal neurologically. Skin:  Intact without significant lesions or rashes. Cervical Nodes:  No significant cervical adenopathy. Psych:  Alert and cooperative. Normal mood and affect.

## 2013-08-08 ENCOUNTER — Encounter (HOSPITAL_COMMUNITY): Payer: Self-pay | Admitting: Internal Medicine

## 2013-08-09 ENCOUNTER — Telehealth: Payer: Self-pay | Admitting: *Deleted

## 2013-08-09 MED ORDER — QUINAPRIL HCL 5 MG PO TABS: 5.0000 mg | ORAL_TABLET | Freq: Every day | ORAL | Status: DC

## 2013-08-09 MED ORDER — EZETIMIBE 10 MG PO TABS: 10.0000 mg | ORAL_TABLET | Freq: Every day | ORAL | Status: DC

## 2013-08-09 NOTE — Telephone Encounter (Signed)
PT NEEDS ZETIA QUINAPRIL CALLED IN TO [facility]

## 2013-08-09 NOTE — Telephone Encounter (Signed)
Medication sent via escribe.  

## 2013-12-04 ENCOUNTER — Other Ambulatory Visit: Payer: Self-pay | Admitting: Obstetrics and Gynecology

## 2013-12-05 LAB — CYTOLOGY - PAP

## 2014-01-20 ENCOUNTER — Telehealth: Payer: Self-pay | Admitting: Pharmacist Clinician (PhC)/ Clinical Pharmacy Specialist

## 2014-01-20 MED ORDER — RIVAROXABAN 15 MG PO TABS: 15.0000 mg | ORAL_TABLET | Freq: Every day | ORAL | Status: DC

## 2014-01-20 NOTE — Telephone Encounter (Signed)
Pt bringing in patient assistance forms for Xarelto.  Went thru forms, gave patient 2 pages for her to fill out/sign, she can bring them back when she sees Dr. Loletha Grayer on Oct 19.  Also gave pt paperwork for [employer], explained the program.  She will call to see if she qualifies.    Based on SCr in computer, dose of Xarelto needs to be adjusted down to 15mg  (CrCl was 42).  Gave patient 4 weeks of samples, will make note in med list that dose changed.  Pt states seeing nephrology next week, asked that she bring copy of labs for verification of dose.

## 2014-02-10 ENCOUNTER — Encounter: Payer: Self-pay | Admitting: Cardiovascular Disease

## 2014-02-10 ENCOUNTER — Ambulatory Visit (INDEPENDENT_AMBULATORY_CARE_PROVIDER_SITE_OTHER): Payer: Medicare Other | Admitting: Cardiovascular Disease

## 2014-02-10 VITALS — BP 118/67 | HR 90 | Ht 62.0 in | Wt 185.9 lb

## 2014-02-10 DIAGNOSIS — N183 Chronic kidney disease, stage 3 unspecified: Secondary | ICD-10-CM

## 2014-02-10 DIAGNOSIS — E66811 Obesity, class 1: Secondary | ICD-10-CM

## 2014-02-10 DIAGNOSIS — Z889 Allergy status to unspecified drugs, medicaments and biological substances status: Secondary | ICD-10-CM

## 2014-02-10 DIAGNOSIS — E785 Hyperlipidemia, unspecified: Secondary | ICD-10-CM

## 2014-02-10 DIAGNOSIS — E669 Obesity, unspecified: Secondary | ICD-10-CM

## 2014-02-10 DIAGNOSIS — Z789 Other specified health status: Secondary | ICD-10-CM

## 2014-02-10 DIAGNOSIS — I4891 Unspecified atrial fibrillation: Secondary | ICD-10-CM

## 2014-02-10 DIAGNOSIS — R011 Cardiac murmur, unspecified: Secondary | ICD-10-CM

## 2014-02-10 DIAGNOSIS — I2510 Atherosclerotic heart disease of native coronary artery without angina pectoris: Secondary | ICD-10-CM

## 2014-02-10 MED ORDER — EZETIMIBE 10 MG PO TABS: 10.0000 mg | ORAL_TABLET | Freq: Every day | ORAL | Status: DC

## 2014-02-10 NOTE — Patient Instructions (Addendum)
Your physician recommends that you return for lab work in: [employer].  Your physician has requested that you have an echocardiogram at [facility]. Echocardiography is a painless test that uses sound waves to create images of your heart. It provides your doctor with information about the size and shape of your heart and how well your heart's chambers and valves are working. This procedure takes approximately one hour. There are no restrictions for this procedure.   WE WILL CALL WITH THE RESULTS OF THE ABOVE TEST RESULTS WITH POSSIBLE MEDICATION ADJUSTMENTS AND FURTHER TESTING.

## 2014-02-10 NOTE — Assessment & Plan Note (Signed)
Need to take this into account when deciding on coronary angiography versus stress testing

## 2014-02-10 NOTE — Progress Notes (Signed)
Patient ID: Sandra Bradford, female   DOB: 1940-06-25, 73 y.o.   MRN: [id number]      Reason for office visit Atrial fibrillation, CAD s/p CABG  I last saw Sandra Bradford in January of 2015. Her son-in-law, [name] and her daughter [name] are also patients in our clinic.  She has a long-standing history of coronary artery disease and underwent 5 vessel bypass surgery about 13 years ago. She has not had any coronary symptoms or events since that time. She is intolerant to statins which have caused both myalgia and mental confusion. She has treated hypertension and normal left ventricular systolic function  In February of 2015 she developed new onset atrial fibrillation rapid ventricular response and has been following with Dr. Bronson Ing in the [facility]. A strategy of anti-coagulation and rate control was used and she seems to do well with this initially. She now complains of increased shortness of breath and fatigue, NYHA functional class II-III. She has not had lower extremity edema denies orthopnea or PND but sometimes becomes short of breath with fairly minimal activity. She has not had angina pectoris.  She is basically here to obtain another opinion about treatment of her arrhythmia. She is troubled by palpitations "all the time" and is worried by her decreased exercise tolerance. She also complains of "feeling cold all the time". She had radioactive iodine therapy for a thyroid nodule in the 1970s. She is not on thyroid replacement therapy.  Her last echo was performed in September of 2014, before the arrhythmia onset. At that point left ventricular ejection fraction was 60-65% with normal regional wall motion. Her left atrium was mildly dilated. There were no serious valvular abnormalities. There is a note in the electronic record from May 30, 2013 that another echocardiogram was performed but I find no trace of the study either in the imagearchiving system or in the reporting  system.  Her last stress test was a nuclear perfusion study in April 2010 with normal perfusion pattern.   Allergies  Allergen Reactions  . Codeine Nausea Only  . Statins     MYALGIA CONFUSION    Current Outpatient Prescriptions  Medication Sig Dispense Refill  . acetaminophen (TYLENOL) 500 MG tablet Take 1,000 mg by mouth every 6 (six) hours as needed. For pain      . furosemide (LASIX) 20 MG tablet Take 20 mg by mouth daily.       . metoprolol tartrate (LOPRESSOR) 50 MG tablet Take 1 tablet (50 mg total) by mouth 2 (two) times daily.  180 tablet  3  . Multiple Vitamin (MULITIVITAMIN WITH MINERALS) TABS Take 1 tablet by mouth daily.      . quinapril (ACCUPRIL) 5 MG tablet Take 1 tablet (5 mg total) by mouth daily.  90 tablet  3  . Rivaroxaban (XARELTO) 15 MG TABS tablet Take 1 tablet (15 mg total) by mouth daily with supper.  30 tablet  5  . ezetimibe (ZETIA) 10 MG tablet Take 1 tablet (10 mg total) by mouth daily.  63 tablet  0   No current facility-administered medications for this visit.    Past Medical History  Diagnosis Date  . Coronary artery disease   . Hyperlipemia   . Systemic hypertension   . Carotid arterial disease   . PAD (peripheral artery disease)   . Diverticulitis   . A-fib     Past Surgical History  Procedure Laterality Date  . Tonsillectomy  1953  . Tubal ligation    .  Partial hysterectomy    . Knee surgery      RIGHT  . Cardiac surgery  01/2001    LIMA TO LAD,SVG TO DIAGONAL,SVG TO MARGINAL,SVG SEQUENTALLY TO THE PD & PL VESSEL  . Cholecystectomy N/A 01/17/2013    Procedure: LAPAROSCOPIC CHOLECYSTECTOMY;  Surgeon: Donato Heinz, MD;  Location: [facility];  Service: General;  Laterality: N/A;  . Nm myocar perf wall motion  08/18/2008    No significant ischemia  . US echocardiography  07/04/2008    trace MR,mild TR  . Abdominal hysterectomy    . Colonoscopy  July 2007    Dr. Oneida Alar: ascending and sigmoid colon diverticula  . Colonoscopy  Dec 2011      Dr. Oneida Alar: ischemic colitis. CTA performed with patent celiac, SMA, and IMA  . Colonoscopy N/A 08/06/2013    Procedure: COLONOSCOPY;  Surgeon: Daneil Dolin, MD;  Location: [facility];  Service: Endoscopy;  Laterality: N/A;  8:30    Family History  Problem Relation Age of Onset  . Heart attack Mother   . Heart attack Father   . Congestive Heart Failure Father   . Colon cancer Neg Hx     History   Social History  . Marital Status: Divorced    Spouse Name: N/A    Number of Children: N/A  . Years of Education: N/A   Occupational History  . Not on file.   Social History Main Topics  . Smoking status: Never Smoker   . Smokeless tobacco: Not on file  . Alcohol Use: Yes     Comment: beer, wine once in awhile   . Drug Use: No  . Sexual Activity: Not on file   Other Topics Concern  . Not on file   Social History Narrative  . No narrative on file    Review of systems: He is troubled by palpitations daily. She has shortness of breath NYHA class II-III. The patient specifically denies any chest pain at rest or with exertion, dyspnea at rest, orthopnea, paroxysmal nocturnal dyspnea, syncope, focal neurological deficits, intermittent claudication, lower extremity edema, unexplained weight gain, cough, hemoptysis or wheezing.  The patient also denies abdominal pain, nausea, vomiting, dysphagia, diarrhea, constipation, polyuria, polydipsia, dysuria, hematuria, frequency, urgency, abnormal bleeding or bruising, fever, chills, unexpected weight changes, mood swings, change in skin or hair texture, change in voice quality, auditory or visual problems, allergic reactions or rashes, new musculoskeletal complaints other than usual "aches and pains".   PHYSICAL EXAM BP 118/67  Pulse 90  Ht 5\' 2"  (1.575 m)  Wt 84.324 kg (185 lb 14.4 oz)  BMI 33.99 kg/m2  LMP 04/25/1978  General: Alert, oriented x3, no distress Head: no evidence of trauma, PERRL, EOMI, no exophtalmos or lid lag,  no myxedema, no xanthelasma; normal ears, nose and oropharynx Neck: normal jugular venous pulsations and no hepatojugular reflux; brisk carotid pulses without delay and no carotid bruits Chest: clear to auscultation, no signs of consolidation by percussion or palpation, normal fremitus, symmetrical and full respiratory excursions Cardiovascular: normal position and quality of the apical impulse, irregular rhythm, normal first and second heart sounds, no rubs or gallops, Grade 1/6 early peak in systolic ejection murmur. Abdomen: no tenderness or distention, no masses by palpation, no abnormal pulsatility or arterial bruits, normal bowel sounds, no hepatosplenomegaly Extremities: no clubbing, cyanosis or edema; 2+ radial, ulnar and brachial pulses bilaterally; 2+ right femoral, posterior tibial and dorsalis pedis pulses; 2+ left femoral, posterior tibial and dorsalis pedis pulses; no  subclavian or femoral bruits Neurological: grossly nonfocal   EKG: atrial fibrillation, otherwise normal  Lipid Panel     Component Value Date/Time   CHOL 236* 11/06/2012 0845   TRIG 152* 11/06/2012 0845   HDL 50 11/06/2012 0845   CHOLHDL 4.7 11/06/2012 0845   VLDL 30 11/06/2012 0845   LDLCALC 156* 11/06/2012 0845    BMET    Component Value Date/Time   NA 142 06/10/2013 1057   K 4.4 06/10/2013 1057   CL 101 06/10/2013 1057   CO2 27 06/10/2013 1057   GLUCOSE 108* 06/10/2013 1057   BUN 27* 06/10/2013 1057   CREATININE 1.42* 06/10/2013 1057   CREATININE 1.34* 11/06/2012 0845   CALCIUM 9.9 06/10/2013 1057   GFRNONAA 35* 06/10/2013 1057   GFRAA 41* 06/10/2013 1057     ASSESSMENT AND PLAN CAD (coronary artery disease) Currently no symptoms of angina pectoris, but she has substantial problems with dyspnea. She has not had any recent perfusion imaging study. 13 years have passed since her bypass surgery and graft failure is a distinct possibility. Consider reevaluation with a perfusion study. We'll start with an  echocardiogram. If ejection fraction is low she should probably have repeat cardiac catheterization. If it is normal will recommend a nuclear stress test.  Atrial fibrillation I think the initial impression was that rate control was sufficient symptom control but I'm not so sure that this is the case. Repeat her echocardiogram. If left ventricular systolic function is still normal and the left atrium is indeed only mildly dilated, consider an attempt at cardioversion. She has been in atrial fibrillation after several months and would probably advise that we start antiarrhythmic therapy before the cardioversion due to the high risk of recurrence. The cardioversion provides significant symptomatic improvement, continued antiarrhythmic therapy is justified. If she does not hold sinus rhythm or if there is no evidence of benefit following successful cardioversion will return to a rate control strategy. Her thyroid status also needs to be revisited since she has a history of radioactive iodine therapy and symptoms suggestive of hypothyroidism. Amiodarone therapy will have an impact on thyroid status.  Statin intolerance She might be a very good candidate for Praluent or Repatha. We'll address this after we've clarified the course of treatment for her arrhythmia  Hyperlipidemia She has mixed hyperlipidemia with both substantial elevation in LDL cholesterol and very mild hypertriglyceridemia. The latter is probably related to some degree of insulin resistance in the setting of obesity and borderline elevation in glucose.  Systolic murmur Her murmur is suggestive of mild aortic valve stenosis. This will be reevaluated by echo. She only had aortic valve sclerosis last year  Obesity (BMI 30.0-34.9) Weight loss is recommended to help with borderline diabetes and hypertriglyceridemia.  Chronic kidney disease, stage 3 Need to take this into account when deciding on coronary angiography versus stress  testing   Orders Placed This Encounter  Procedures  . TSH  . T4, free  . EKG 12-Lead  . 2D Echocardiogram without contrast   Meds ordered this encounter  Medications  . furosemide (LASIX) 20 MG tablet    Sig: Take 20 mg by mouth daily.   Marland Kitchen ezetimibe (ZETIA) 10 MG tablet    Sig: Take 1 tablet (10 mg total) by mouth daily.    Dispense:  63 tablet    Refill:  0    Veronnica Hennings  Sanda Klein, MD, [facility] [phone] office [phone] pager

## 2014-02-10 NOTE — Assessment & Plan Note (Signed)
Her murmur is suggestive of mild aortic valve stenosis. This will be reevaluated by echo. She only had aortic valve sclerosis last year

## 2014-02-10 NOTE — Assessment & Plan Note (Addendum)
Currently no symptoms of angina pectoris, but she has substantial problems with dyspnea. She has not had any recent perfusion imaging study. 13 years have passed since her bypass surgery and graft failure is a distinct possibility. Consider reevaluation with a perfusion study. We'll start with an echocardiogram. If ejection fraction is low she should probably have repeat cardiac catheterization. If it is normal will recommend a nuclear stress test.

## 2014-02-10 NOTE — Assessment & Plan Note (Signed)
She might be a very good candidate for Praluent or Repatha. We'll address this after we've clarified the course of treatment for her arrhythmia

## 2014-02-10 NOTE — Assessment & Plan Note (Addendum)
I think the initial impression was that rate control was sufficient symptom control but I'm not so sure that this is the case. Repeat her echocardiogram. If left ventricular systolic function is still normal and the left atrium is indeed only mildly dilated, consider an attempt at cardioversion. She has been in atrial fibrillation after several months and would probably advise that we start antiarrhythmic therapy before the cardioversion due to the high risk of recurrence. The cardioversion provides significant symptomatic improvement, continued antiarrhythmic therapy is justified. If she does not hold sinus rhythm or if there is no evidence of benefit following successful cardioversion will return to a rate control strategy. Her thyroid status also needs to be revisited since she has a history of radioactive iodine therapy and symptoms suggestive of hypothyroidism. Amiodarone therapy will have an impact on thyroid status.

## 2014-02-10 NOTE — Assessment & Plan Note (Signed)
She has mixed hyperlipidemia with both substantial elevation in LDL cholesterol and very mild hypertriglyceridemia. The latter is probably related to some degree of insulin resistance in the setting of obesity and borderline elevation in glucose.

## 2014-02-10 NOTE — Assessment & Plan Note (Signed)
Weight loss is recommended to help with borderline diabetes and hypertriglyceridemia.

## 2014-02-11 LAB — T4, FREE: FREE T4: 1.1 ng/dL (ref 0.80–1.80)

## 2014-02-11 LAB — TSH: TSH: 0.895 u[IU]/mL (ref 0.350–4.500)

## 2014-02-12 ENCOUNTER — Telehealth: Payer: Self-pay | Admitting: *Deleted

## 2014-02-12 ENCOUNTER — Ambulatory Visit (HOSPITAL_COMMUNITY)
Admission: RE | Admit: 2014-02-12 | Discharge: 2014-02-12 | Disposition: A | Discharge status: Home / Self Care | Payer: Medicare Other | Source: Ambulatory Visit | Attending: Cardiovascular Disease | Admitting: Cardiovascular Disease

## 2014-02-12 ENCOUNTER — Encounter: Payer: Self-pay | Admitting: Cardiovascular Disease

## 2014-02-12 DIAGNOSIS — I2510 Atherosclerotic heart disease of native coronary artery without angina pectoris: Secondary | ICD-10-CM | POA: Diagnosis not present

## 2014-02-12 DIAGNOSIS — I10 Essential (primary) hypertension: Secondary | ICD-10-CM | POA: Diagnosis not present

## 2014-02-12 DIAGNOSIS — E785 Hyperlipidemia, unspecified: Secondary | ICD-10-CM | POA: Insufficient documentation

## 2014-02-12 DIAGNOSIS — I369 Nonrheumatic tricuspid valve disorder, unspecified: Secondary | ICD-10-CM

## 2014-02-12 DIAGNOSIS — I4891 Unspecified atrial fibrillation: Secondary | ICD-10-CM | POA: Diagnosis present

## 2014-02-12 DIAGNOSIS — I081 Rheumatic disorders of both mitral and tricuspid valves: Secondary | ICD-10-CM | POA: Insufficient documentation

## 2014-02-12 MED ORDER — AMIODARONE HCL 400 MG PO TABS: 400.0000 mg | ORAL_TABLET | Freq: Every day | ORAL | Status: DC

## 2014-02-12 NOTE — Telephone Encounter (Signed)
Message copied by Tressa Busman on Wed Feb 12, 2014  1:21 PM ------      Message from: Sanda Klein      Created: Wed Feb 12, 2014 12:58 PM       Normal heart pumping function. Left atrium is moderately dilated and we have a "50-50" chance that a cardioversion will work to return her to sinus rhythm. I would like her to take amiodarone 400 mg daily and schedule for the cardioversion after about 3 weeks. ------

## 2014-02-12 NOTE — Progress Notes (Signed)
  Echocardiogram 2D Echocardiogram has been performed.  [place], [place] 02/12/2014, 11:07 AM

## 2014-02-12 NOTE — Telephone Encounter (Signed)
Echo and lab results called to patient.  Will start Amiodarone 400mg  daily and will schedule a cardioversion in about 3 weeks.  Will schedule an appt w/Dr. C prior to the DCCV.  Patient voiced understanding.

## 2014-02-14 ENCOUNTER — Other Ambulatory Visit: Payer: Self-pay | Admitting: *Deleted

## 2014-02-14 DIAGNOSIS — I4891 Unspecified atrial fibrillation: Secondary | ICD-10-CM

## 2014-03-03 ENCOUNTER — Telehealth: Payer: Self-pay | Admitting: *Deleted

## 2014-03-03 ENCOUNTER — Ambulatory Visit (INDEPENDENT_AMBULATORY_CARE_PROVIDER_SITE_OTHER): Payer: Medicare Other | Admitting: Cardiology

## 2014-03-03 ENCOUNTER — Encounter: Payer: Self-pay | Admitting: Cardiology

## 2014-03-03 VITALS — BP 132/70 | HR 79 | Ht 62.0 in | Wt 190.8 lb

## 2014-03-03 DIAGNOSIS — R0600 Dyspnea, unspecified: Secondary | ICD-10-CM

## 2014-03-03 DIAGNOSIS — R0609 Other forms of dyspnea: Secondary | ICD-10-CM

## 2014-03-03 DIAGNOSIS — Z7901 Long term (current) use of anticoagulants: Secondary | ICD-10-CM

## 2014-03-03 DIAGNOSIS — I2510 Atherosclerotic heart disease of native coronary artery without angina pectoris: Secondary | ICD-10-CM

## 2014-03-03 DIAGNOSIS — I482 Chronic atrial fibrillation, unspecified: Secondary | ICD-10-CM

## 2014-03-03 DIAGNOSIS — N183 Chronic kidney disease, stage 3 unspecified: Secondary | ICD-10-CM

## 2014-03-03 DIAGNOSIS — I10 Essential (primary) hypertension: Secondary | ICD-10-CM

## 2014-03-03 DIAGNOSIS — E785 Hyperlipidemia, unspecified: Secondary | ICD-10-CM

## 2014-03-03 DIAGNOSIS — R5383 Other fatigue: Secondary | ICD-10-CM

## 2014-03-03 DIAGNOSIS — Z79899 Other long term (current) drug therapy: Secondary | ICD-10-CM

## 2014-03-03 DIAGNOSIS — I739 Peripheral vascular disease, unspecified: Secondary | ICD-10-CM

## 2014-03-03 LAB — CBC
HCT: 36.5 % (ref 36.0–46.0)
Hemoglobin: 12 g/dL (ref 12.0–15.0)
MCH: 28.6 pg (ref 26.0–34.0)
MCHC: 32.9 g/dL (ref 30.0–36.0)
MCV: 87.1 fL (ref 78.0–100.0)
Platelets: 240 10*3/uL (ref 150–400)
RBC: 4.19 MIL/uL (ref 3.87–5.11)
RDW: 14.7 % (ref 11.5–15.5)
WBC: 6.4 10*3/uL (ref 4.0–10.5)

## 2014-03-03 LAB — APTT: aPTT: 46 seconds — ABNORMAL HIGH (ref 24–37)

## 2014-03-03 LAB — BASIC METABOLIC PANEL
BUN: 18 mg/dL (ref 6–23)
CO2: 29 mEq/L (ref 19–32)
Calcium: 9.2 mg/dL (ref 8.4–10.5)
Chloride: 102 mEq/L (ref 96–112)
Creat: 1.28 mg/dL — ABNORMAL HIGH (ref 0.50–1.10)
Glucose, Bld: 82 mg/dL (ref 70–99)
Potassium: 4.5 mEq/L (ref 3.5–5.3)
Sodium: 141 mEq/L (ref 135–145)

## 2014-03-03 LAB — PROTIME-INR
INR: 2.02 — ABNORMAL HIGH (ref <1.50)
Prothrombin Time: 22.9 seconds — ABNORMAL HIGH (ref 11.6–15.2)

## 2014-03-03 NOTE — Assessment & Plan Note (Signed)
Controlled.  

## 2014-03-03 NOTE — Assessment & Plan Note (Signed)
Present for the last few months. She is for DCCV 11/13- on Amiodarone and Xarelto.

## 2014-03-03 NOTE — Patient Instructions (Signed)
Your physician has requested that you have a lexiscan myoview. For further information please visit [email or web address]. Please follow instruction sheet, as given. This will need to be scheduled this week. Prior to your cardioversion on Friday.  Your physician recommends that you schedule a follow-up appointment post- cardioversion. This appointment will be scheduled at the time of your discharge from the hospital.

## 2014-03-03 NOTE — Assessment & Plan Note (Signed)
Statin intol 

## 2014-03-03 NOTE — Assessment & Plan Note (Signed)
Carotids:  Moderate plaque without significant obstruction, less than 50% stenoses bilaterally Doppler June 2013

## 2014-03-03 NOTE — Progress Notes (Signed)
03/03/2014 Sandra Bradford   [birth year]  [id number]  Primary Physician Purvis Kilts, MD Primary Cardiologist: Dr Jacinta Shoe Dr [place]  HPI:  73 y/o with a history of CABG x 5 in 2002. She had a low risk Myoview April 2010. In February of 2015 she developed new onset atrial fibrillation. A strategy of anti-coagulation and rate control was used and she seems to do well with this initially. She now complains of increased shortness of breath with exertion and fatigue, NYHA functional class II-III. She has not had lower extremity edema denies orthopnea or PND. She has not had angina pectoris.          She saw Dr Sallyanne Kuster 02/10/14 to see about other options than rate control. He started her on Amiodarone. Ane echo was done 02/12/14 showed normal LVF with moderate LAE. He said if her EF was depressed he would suggest coronary angiogram, if normal Myoview. She is in the office today for follow up. She is cheduled for DCCV 03/07/14. She continues to have DOE. She also says she can hear "clicking" from her AF. She is tolerating Amiodarone.   Current Outpatient Prescriptions  Medication Sig Dispense Refill  . acetaminophen (TYLENOL) 500 MG tablet Take 1,000 mg by mouth daily as needed for mild pain. For pain    . amiodarone (PACERONE) 200 MG tablet Take 200 mg by mouth 2 (two) times daily.    . ergocalciferol (VITAMIN D2) 50000 UNITS capsule Take 50,000 Units by mouth once a week.    . ezetimibe (ZETIA) 10 MG tablet Take 1 tablet (10 mg total) by mouth daily. 63 tablet 0  . furosemide (LASIX) 20 MG tablet Take 10 mg by mouth 2 (two) times daily.     . Magnesium 250 MG TABS Take 250 mg by mouth daily.    . metoprolol tartrate (LOPRESSOR) 50 MG tablet Take 1 tablet (50 mg total) by mouth 2 (two) times daily. 180 tablet 3  . Multiple Vitamin (MULITIVITAMIN WITH MINERALS) TABS Take 1 tablet by mouth daily.    . quinapril (ACCUPRIL) 5 MG tablet Take 1 tablet (5 mg total) by mouth daily. 90 tablet 3    . Rivaroxaban (XARELTO) 15 MG TABS tablet Take 1 tablet (15 mg total) by mouth daily with supper. 30 tablet 5   No current facility-administered medications for this visit.    Allergies  Allergen Reactions  . Codeine Nausea Only  . Statins     MYALGIA CONFUSION    History   Social History  . Marital Status: Divorced    Spouse Name: N/A    Number of Children: N/A  . Years of Education: N/A   Occupational History  . Not on file.   Social History Main Topics  . Smoking status: Never Smoker   . Smokeless tobacco: Not on file  . Alcohol Use: Yes     Comment: beer, wine once in awhile   . Drug Use: No  . Sexual Activity: Not on file   Other Topics Concern  . Not on file   Social History Narrative     Review of Systems: General: negative for chills, fever, night sweats or weight changes.  Cardiovascular: negative for chest pain, dyspnea on exertion, edema, orthopnea, palpitations, paroxysmal nocturnal dyspnea or shortness of breath Dermatological: negative for rash Respiratory: negative for cough or wheezing Urologic: negative for hematuria Abdominal: negative for nausea, vomiting, diarrhea, bright red blood per rectum, melena, or hematemesis Neurologic: negative for visual changes, syncope,  or dizziness All other systems reviewed and are otherwise negative except as noted above.    Blood pressure 132/70, pulse 79, height 5\' 2"  (1.575 m), weight 190 lb 12.8 oz (86.546 kg), last menstrual period 04/25/1978.  General appearance: alert, cooperative, no distress and anxious Lungs: clear to auscultation bilaterally Heart: irregularly irregular rhythm  EKG AF with CVR  ASSESSMENT AND PLAN:   Atrial fibrillation Present for the last few months. She is for DCCV 11/13- on Amiodarone and Xarelto.  CAD (coronary artery disease) Status post CABG 2002 x 5.  Dyspnea on exertion R/O anginal equivalent.  Hyperlipidemia Statin intol  HTN  (hypertension) Controlled  PAD (peripheral artery disease) Carotids:  Moderate plaque without significant obstruction, less than 50% stenoses bilaterally Doppler June 2013  Chronic kidney disease, stage 3 Last SCr 1.42   PLAN  I will order a Lexiscan before her DCCV as per Dr Victorino December instructions. She did mention to me that she was told her HR was slow in recovery when she had a colonoscopy a few months back. She may have a SSS component. She is agreeable to proceed with cardioversion.   Sandra Bradford KPA-C 03/03/2014 10:32 AM

## 2014-03-03 NOTE — Assessment & Plan Note (Signed)
R/O anginal equivalent 

## 2014-03-03 NOTE — Assessment & Plan Note (Signed)
Last SCr 1.42

## 2014-03-03 NOTE — Assessment & Plan Note (Signed)
Status post CABG 2002 x 5.

## 2014-03-03 NOTE — Telephone Encounter (Signed)
Pre-procedure labs to be done today.

## 2014-03-04 ENCOUNTER — Telehealth (HOSPITAL_COMMUNITY): Payer: Self-pay

## 2014-03-04 NOTE — Telephone Encounter (Signed)
Encounter complete. 

## 2014-03-06 ENCOUNTER — Ambulatory Visit (HOSPITAL_COMMUNITY)
Admission: RE | Admit: 2014-03-06 | Discharge: 2014-03-06 | Disposition: A | Discharge status: Home / Self Care | Payer: Medicare Other | Source: Ambulatory Visit | Attending: Cardiology | Admitting: Cardiology

## 2014-03-06 DIAGNOSIS — I482 Chronic atrial fibrillation, unspecified: Secondary | ICD-10-CM

## 2014-03-07 ENCOUNTER — Encounter (HOSPITAL_COMMUNITY): Admission: RE | Payer: Self-pay | Source: Ambulatory Visit

## 2014-03-07 ENCOUNTER — Ambulatory Visit (HOSPITAL_COMMUNITY): Admission: RE | Admit: 2014-03-07 | Payer: Medicare Other | Source: Ambulatory Visit | Admitting: Cardiovascular Disease

## 2014-03-07 SURGERY — CARDIOVERSION
Anesthesia: Monitor Anesthesia Care

## 2014-04-02 DIAGNOSIS — I779 Disorder of arteries and arterioles, unspecified: Secondary | ICD-10-CM | POA: Insufficient documentation

## 2014-04-02 DIAGNOSIS — E785 Hyperlipidemia, unspecified: Secondary | ICD-10-CM | POA: Diagnosis not present

## 2014-04-02 DIAGNOSIS — Z7901 Long term (current) use of anticoagulants: Secondary | ICD-10-CM | POA: Diagnosis not present

## 2014-04-02 DIAGNOSIS — Z9889 Other specified postprocedural states: Secondary | ICD-10-CM | POA: Diagnosis not present

## 2014-04-02 DIAGNOSIS — Z79899 Other long term (current) drug therapy: Secondary | ICD-10-CM | POA: Insufficient documentation

## 2014-04-02 DIAGNOSIS — S30861A Insect bite (nonvenomous) of abdominal wall, initial encounter: Secondary | ICD-10-CM | POA: Insufficient documentation

## 2014-04-02 DIAGNOSIS — Y92007 Garden or yard of unspecified non-institutional (private) residence as the place of occurrence of the external cause: Secondary | ICD-10-CM | POA: Diagnosis not present

## 2014-04-02 DIAGNOSIS — I2510 Atherosclerotic heart disease of native coronary artery without angina pectoris: Secondary | ICD-10-CM | POA: Diagnosis not present

## 2014-04-02 DIAGNOSIS — Y9389 Activity, other specified: Secondary | ICD-10-CM | POA: Insufficient documentation

## 2014-04-02 DIAGNOSIS — W57XXXA Bitten or stung by nonvenomous insect and other nonvenomous arthropods, initial encounter: Secondary | ICD-10-CM | POA: Diagnosis not present

## 2014-04-02 DIAGNOSIS — Y998 Other external cause status: Secondary | ICD-10-CM | POA: Insufficient documentation

## 2014-04-02 DIAGNOSIS — I129 Hypertensive chronic kidney disease with stage 1 through stage 4 chronic kidney disease, or unspecified chronic kidney disease: Secondary | ICD-10-CM | POA: Insufficient documentation

## 2014-04-02 DIAGNOSIS — N183 Chronic kidney disease, stage 3 (moderate): Secondary | ICD-10-CM | POA: Insufficient documentation

## 2014-04-03 ENCOUNTER — Encounter (HOSPITAL_COMMUNITY): Payer: Self-pay | Admitting: Emergency Medicine

## 2014-04-03 ENCOUNTER — Emergency Department (HOSPITAL_COMMUNITY)
Admission: EM | Admit: 2014-04-03 | Discharge: 2014-04-03 | Disposition: A | Discharge status: Home / Self Care | Payer: Medicare Other | Attending: Emergency Medicine | Admitting: Emergency Medicine

## 2014-04-03 DIAGNOSIS — W57XXXA Bitten or stung by nonvenomous insect and other nonvenomous arthropods, initial encounter: Secondary | ICD-10-CM

## 2014-04-03 DIAGNOSIS — S30861A Insect bite (nonvenomous) of abdominal wall, initial encounter: Secondary | ICD-10-CM

## 2014-04-03 MED ORDER — LIDOCAINE HCL (PF) 1 % IJ SOLN
INTRAMUSCULAR | Status: AC
  Filled 2014-04-03: qty 5

## 2014-04-03 NOTE — Discharge Instructions (Signed)
Tick Bite Information Ticks are insects that attach themselves to the skin and draw blood for food. There are various types of ticks. Common types include wood ticks and deer ticks. Most ticks live in shrubs and grassy areas. Ticks can climb onto your body when you make contact with leaves or grass where the tick is waiting. The most common places on the body for ticks to attach themselves are the scalp, neck, armpits, waist, and groin. Most tick bites are harmless, but sometimes ticks carry germs that cause diseases. These germs can be spread to a person during the tick's feeding process. The chance of a disease spreading through a tick bite depends on:   The type of tick.  Time of year.   How long the tick is attached.   Geographic location.  HOW CAN YOU PREVENT TICK BITES? Take these steps to help prevent tick bites when you are outdoors:  Wear protective clothing. Long sleeves and long pants are best.   Wear white clothes so you can see ticks more easily.  Tuck your pant legs into your socks.   If walking on a trail, stay in the middle of the trail to avoid brushing against bushes.  Avoid walking through areas with long grass.  Put insect repellent on all exposed skin and along boot tops, pant legs, and sleeve cuffs.   Check clothing, hair, and skin repeatedly and before going inside.   Brush off any ticks that are not attached.  Take a shower or bath as soon as possible after being outdoors.  WHAT IS THE PROPER WAY TO REMOVE A TICK? Ticks should be removed as soon as possible to help prevent diseases caused by tick bites. 1. If latex gloves are available, put them on before trying to remove a tick.  2. Using fine-point tweezers, grasp the tick as close to the skin as possible. You may also use curved forceps or a tick removal tool. Grasp the tick as close to its head as possible. Avoid grasping the tick on its body. 3. Pull gently with steady upward pressure until  the tick lets go. Do not twist the tick or jerk it suddenly. This may break off the tick's head or mouth parts. 4. Do not squeeze or crush the tick's body. This could force disease-carrying fluids from the tick into your body.  5. After the tick is removed, wash the bite area and your hands with soap and water or other disinfectant such as alcohol. 6. Apply a small amount of antiseptic cream or ointment to the bite site.  7. Wash and disinfect any instruments that were used.  Do not try to remove a tick by applying a hot match, petroleum jelly, or fingernail polish to the tick. These methods do not work and may increase the chances of disease being spread from the tick bite.  WHEN SHOULD YOU SEEK MEDICAL CARE? Contact your health care provider if you are unable to remove a tick from your skin or if a part of the tick breaks off and is stuck in the skin.  After a tick bite, you need to be aware of signs and symptoms that could be related to diseases spread by ticks. Contact your health care provider if you develop any of the following in the days or weeks after the tick bite:  Unexplained fever.  Rash. A circular rash that appears days or weeks after the tick bite may indicate the possibility of Lyme disease. The rash may resemble   a target with a bull's-eye and may occur at a different part of your body than the tick bite.  Redness and swelling in the area of the tick bite.   Tender, swollen lymph glands.   Diarrhea.   Weight loss.   Cough.   Fatigue.   Muscle, joint, or bone pain.   Abdominal pain.   Headache.   Lethargy or a change in your level of consciousness.  Difficulty walking or moving your legs.   Numbness in the legs.   Paralysis.  Shortness of breath.   Confusion.   Repeated vomiting.  Document Released: 04/08/2000 Document Revised: 01/30/2013 Document Reviewed: 09/19/2012 [employer] Patient Information 2015 [employer]. This information is  not intended to replace advice given to you by your health care provider. Make sure you discuss any questions you have with your health care provider.  

## 2014-04-03 NOTE — ED Notes (Signed)
Patient noticed a Tick  In her left abdomin. Patient has red area around where tick is located. No other complaints at this time.

## 2014-04-05 NOTE — ED Provider Notes (Signed)
CSN: [id number]     Arrival date & time 04/02/14  2339 History   First MD Initiated Contact with Patient 04/03/14 0033     Chief Complaint  Patient presents with  . Tick Removal     (Consider location/radiation/quality/duration/timing/severity/associated sxs/prior Treatment) The history is provided by the patient.   Sandra Bradford is a 73 y.o. female presenting with tick attached to her abdomen which she noticed tonight while getting ready for bed.  She worked in her yard this morning putting up [employer] which is where she probably picked up the tick.  She denies fevers, chills, nausea, rash, pain.      Past Medical History  Diagnosis Date  . Coronary artery disease   . Hyperlipemia   . Systemic hypertension   . Carotid arterial disease   . PAD (peripheral artery disease)   . Diverticulitis   . A-fib   . Chronic kidney disease, stage 3 02/10/2014   Past Surgical History  Procedure Laterality Date  . Tonsillectomy  1953  . Tubal ligation    . Partial hysterectomy    . Knee surgery      RIGHT  . Cardiac surgery  01/2001    LIMA TO LAD,SVG TO DIAGONAL,SVG TO MARGINAL,SVG SEQUENTALLY TO THE PD & PL VESSEL  . Cholecystectomy N/A 01/17/2013    Procedure: LAPAROSCOPIC CHOLECYSTECTOMY;  Surgeon: Donato Heinz, MD;  Location: [facility];  Service: General;  Laterality: N/A;  . Nm myocar perf wall motion  08/18/2008    No significant ischemia  . US echocardiography  07/04/2008    trace MR,mild TR  . Abdominal hysterectomy    . Colonoscopy  July 2007    Dr. Oneida Alar: ascending and sigmoid colon diverticula  . Colonoscopy  Dec 2011    Dr. Oneida Alar: ischemic colitis. CTA performed with patent celiac, SMA, and IMA  . Colonoscopy N/A 08/06/2013    Procedure: COLONOSCOPY;  Surgeon: Daneil Dolin, MD;  Location: [facility];  Service: Endoscopy;  Laterality: N/A;  8:30   Family History  Problem Relation Age of Onset  . Heart attack Mother   . Heart attack Father   .  Congestive Heart Failure Father   . Colon cancer Neg Hx    History  Substance Use Topics  . Smoking status: Never Smoker   . Smokeless tobacco: Not on file  . Alcohol Use: Yes     Comment: beer, wine once in awhile    OB History    No data available     Review of Systems  Constitutional: Negative for fever and chills.  HENT: Negative for sore throat.   Respiratory: Negative.   Cardiovascular: Negative.   Gastrointestinal: Negative for nausea and abdominal pain.  Musculoskeletal: Negative for joint swelling, arthralgias and neck pain.  Skin: Negative for rash and wound.       Negative except as mentioned in HPI.    Neurological: Negative.       Allergies  Codeine and Statins  Home Medications   Prior to Admission medications   Medication Sig Start Date End Date Taking? Authorizing Provider  acetaminophen (TYLENOL) 500 MG tablet Take 1,000 mg by mouth daily as needed for mild pain. For pain   Yes Historical Provider, MD  amiodarone (PACERONE) 200 MG tablet Take 200 mg by mouth 2 (two) times daily.   Yes Historical Provider, MD  ergocalciferol (VITAMIN D2) 50000 UNITS capsule Take 50,000 Units by mouth once a week.   Yes Historical Provider,  MD  ezetimibe (ZETIA) 10 MG tablet Take 1 tablet (10 mg total) by mouth daily. 02/10/14  Yes Mihai Croitoru, MD  furosemide (LASIX) 20 MG tablet Take 10 mg by mouth 2 (two) times daily.  01/23/14  Yes Historical Provider, MD  Magnesium 250 MG TABS Take 250 mg by mouth daily.   Yes Historical Provider, MD  metoprolol tartrate (LOPRESSOR) 50 MG tablet Take 1 tablet (50 mg total) by mouth 2 (two) times daily. 06/13/13  Yes Herminio Commons, MD  Multiple Vitamin (MULITIVITAMIN WITH MINERALS) TABS Take 1 tablet by mouth daily.   Yes Historical Provider, MD  quinapril (ACCUPRIL) 5 MG tablet Take 1 tablet (5 mg total) by mouth daily. 08/09/13  Yes Lendon Colonel, NP  Rivaroxaban (XARELTO) 15 MG TABS tablet Take 1 tablet (15 mg total) by  mouth daily with supper. 01/20/14  Yes Mihai Croitoru, MD   BP 132/68 mmHg  Pulse 81  Temp(Src) 98 F (36.7 C)  Resp 18  Ht 5' 2.5" (1.588 m)  Wt 190 lb (86.183 kg)  BMI 34.18 kg/m2  SpO2 97%  LMP 04/25/1978 Physical Exam  Constitutional: She appears well-developed and well-nourished.  HENT:  Head: Normocephalic.  Eyes: Conjunctivae are normal.  Neck: Neck supple.  Cardiovascular: Normal rate.   Pulmonary/Chest: Effort normal.  Abdominal: Soft. She exhibits no distension.  Skin:  Tick firmly embedded left upper abdomen, not engorged.      ED Course  FOREIGN BODY REMOVAL Date/Time: 04/03/2014 12:15 AM Performed by: Evalee Jefferson Authorized by: Evalee Jefferson Consent: Verbal consent obtained. Risks and benefits: risks, benefits and alternatives were discussed Consent given by: patient Patient identity confirmed: verbally with patient Time out: Immediately prior to procedure a "time out" was called to verify the correct patient, procedure, equipment, support staff and site/side marked as required. Body area: skin General location: trunk Location details: abdomen Anesthesia: local infiltration Local anesthetic: lidocaine 1% without epinephrine Anesthetic total: 2 ml Localization method: visualized Removal mechanism: forceps Depth: subcutaneous 1 objects recovered. Objects recovered: tick,  removed in pieces as pinchers did not accompany main body Post-procedure assessment: foreign body removed Patient tolerance: Patient tolerated the procedure well with no immediate complications   (including critical care time) Labs Review Labs Reviewed - No data to display  Imaging Review No results found.   EKG Interpretation None      MDM   Final diagnoses:  Tick bite of abdomen, initial encounter    Prn f/u ancticipated. Tick bite instructions given including signs/sx to watch for over the next few weeks including fever, headache, rash, flu like sx.  Pt understands  plan.    Evalee Jefferson, PA-C 04/05/14 [place], MD 04/07/14 [phone]

## 2014-04-28 ENCOUNTER — Other Ambulatory Visit: Payer: Self-pay | Admitting: *Deleted

## 2014-04-28 MED ORDER — METOPROLOL TARTRATE 50 MG PO TABS
50.0000 mg | ORAL_TABLET | Freq: Two times a day (BID) | ORAL | Status: DC
Start: 2014-04-28 — End: 2014-05-08

## 2014-04-28 MED ORDER — RIVAROXABAN 15 MG PO TABS: 15.0000 mg | ORAL_TABLET | Freq: Every day | ORAL | Status: DC

## 2014-04-28 MED ORDER — QUINAPRIL HCL 5 MG PO TABS: 5.0000 mg | ORAL_TABLET | Freq: Every day | ORAL | Status: DC

## 2014-04-28 NOTE — Telephone Encounter (Signed)
Walk-in refill request completed. Pt called to inform.

## 2014-04-29 ENCOUNTER — Encounter: Payer: Self-pay | Admitting: Cardiovascular Disease

## 2014-04-29 ENCOUNTER — Other Ambulatory Visit: Payer: Self-pay | Admitting: *Deleted

## 2014-04-29 ENCOUNTER — Ambulatory Visit (HOSPITAL_COMMUNITY)
Admission: RE | Admit: 2014-04-29 | Discharge: 2014-04-29 | Disposition: A | Discharge status: Home / Self Care | Payer: Medicare Other | Source: Ambulatory Visit | Attending: Cardiovascular Disease | Admitting: Cardiovascular Disease

## 2014-04-29 ENCOUNTER — Ambulatory Visit (INDEPENDENT_AMBULATORY_CARE_PROVIDER_SITE_OTHER): Payer: Medicare Other | Admitting: Cardiovascular Disease

## 2014-04-29 ENCOUNTER — Other Ambulatory Visit (HOSPITAL_COMMUNITY): Payer: Self-pay | Admitting: Cardiovascular Disease

## 2014-04-29 DIAGNOSIS — R0609 Other forms of dyspnea: Secondary | ICD-10-CM | POA: Insufficient documentation

## 2014-04-29 DIAGNOSIS — Z951 Presence of aortocoronary bypass graft: Secondary | ICD-10-CM | POA: Insufficient documentation

## 2014-04-29 DIAGNOSIS — I482 Chronic atrial fibrillation, unspecified: Secondary | ICD-10-CM

## 2014-04-29 DIAGNOSIS — I739 Peripheral vascular disease, unspecified: Secondary | ICD-10-CM | POA: Insufficient documentation

## 2014-04-29 DIAGNOSIS — R931 Abnormal findings on diagnostic imaging of heart and coronary circulation: Secondary | ICD-10-CM

## 2014-04-29 DIAGNOSIS — R0600 Dyspnea, unspecified: Secondary | ICD-10-CM

## 2014-04-29 DIAGNOSIS — E785 Hyperlipidemia, unspecified: Secondary | ICD-10-CM | POA: Diagnosis not present

## 2014-04-29 DIAGNOSIS — I10 Essential (primary) hypertension: Secondary | ICD-10-CM | POA: Insufficient documentation

## 2014-04-29 DIAGNOSIS — R5383 Other fatigue: Secondary | ICD-10-CM | POA: Diagnosis not present

## 2014-04-29 DIAGNOSIS — R002 Palpitations: Secondary | ICD-10-CM | POA: Insufficient documentation

## 2014-04-29 DIAGNOSIS — Z7901 Long term (current) use of anticoagulants: Secondary | ICD-10-CM

## 2014-04-29 DIAGNOSIS — Z79899 Other long term (current) drug therapy: Secondary | ICD-10-CM

## 2014-04-29 DIAGNOSIS — I2510 Atherosclerotic heart disease of native coronary artery without angina pectoris: Secondary | ICD-10-CM

## 2014-04-29 DIAGNOSIS — E663 Overweight: Secondary | ICD-10-CM | POA: Insufficient documentation

## 2014-04-29 DIAGNOSIS — I480 Paroxysmal atrial fibrillation: Secondary | ICD-10-CM

## 2014-04-29 MED ORDER — TECHNETIUM TC 99M SESTAMIBI GENERIC - CARDIOLITE
30.0000 | Freq: Once | INTRAVENOUS | Status: AC | PRN
  Administered 2014-04-29: 30 via INTRAVENOUS

## 2014-04-29 MED ORDER — REGADENOSON 0.4 MG/5ML IV SOLN
0.4000 mg | Freq: Once | INTRAVENOUS | Status: AC
  Administered 2014-04-29: 0.4 mg via INTRAVENOUS

## 2014-04-29 MED ORDER — AMINOPHYLLINE 25 MG/ML IV SOLN
125.0000 mg | Freq: Once | INTRAVENOUS | Status: AC
  Administered 2014-04-29: 125 mg via INTRAVENOUS

## 2014-04-29 MED ORDER — TECHNETIUM TC 99M SESTAMIBI GENERIC - CARDIOLITE
10.0000 | Freq: Once | INTRAVENOUS | Status: AC | PRN
  Administered 2014-04-29: 10 via INTRAVENOUS

## 2014-04-29 NOTE — Patient Instructions (Signed)
Your physician has recommended that you have a Cardioversion (DCCV). Electrical Cardioversion uses a jolt of electricity to your heart either through paddles or wired patches attached to your chest. This is a controlled, usually prescheduled, procedure. Defibrillation is done under light anesthesia in the hospital, and you usually go home the day of the procedure. This is done to get your heart back into a normal rhythm. You are not awake for the procedure. Please see the instruction sheet given to you today.  Your physician recommends that you return for lab work in: 3-7 days prior to the above procedure.  Dr. Sallyanne Kuster recommends that you schedule a follow-up appointment in: 10 days after the cardioversion.

## 2014-04-29 NOTE — Procedures (Addendum)
[facility] [address] [place] [address] [id number]  Cardiology Nuclear Med Study  Sandra Bradford is a 74 y.o. female     MRN : [id number]     DOB: [birth year]  Procedure Date: 04/29/2014  Nuclear Med Background Indication for Stress Test:  Surgical Clearance History:  CAD;AFIB w/CVR;CABG X5-2002;CKD III;Last NUC MPI on 08/18/2008;ECHO on 02/10/2014-LVEF=55% Cardiac Risk Factors: Carotid Disease, Family History - CAD, Hypertension, Lipids, Overweight and PVD  Symptoms:  DOE, Fatigue and Palpitations   Nuclear Pre-Procedure Caffeine/Decaff Intake:  7:00pm NPO After: 5:00am   IV Site: R Forearm  IV 0.9% NS with Angio Cath:  22g  Chest Size (in):  n/a IV Started by: Rolene Course, RN  Height: 5\' 2"  (1.575 m)  Cup Size: D  BMI:  Body mass index is 34.74 kg/(m^2). Weight:  190 lb (86.183 kg)   Tech Comments:  n/a    Nuclear Med Study 1 or 2 day study: 1 day  Stress Test Type:  [place] Provider:  Sanda Klein, MD   Resting Radionuclide: Technetium 61m Sestamibi  Resting Radionuclide Dose: 10.7 mCi   Stress Radionuclide:  Technetium 49m Sestamibi  Stress Radionuclide Dose: 31.6 mCi           Stress Protocol Rest HR: 68 Stress HR: 102  Rest BP: 141/78 Stress BP: 141/78  Exercise Time (min): n/a METS: n/a          Dose of Adenosine (mg):  n/a Dose of Lexiscan: 0.4 mg  Dose of Atropine (mg): n/a Dose of Dobutamine: n/a mcg/kg/min (at max HR)  Stress Test Technologist: Mellody Memos, CCT Nuclear Technologist: Otho Perl, CNMT   Rest Procedure:  Myocardial perfusion imaging was performed at rest 45 minutes following the intravenous administration of Technetium 64m Sestamibi. Stress Procedure:  The patient received IV Lexiscan 0.4 mg over 15-seconds.  Technetium 24m Sestamibi injected IV at 30-seconds.  Patient experienced shortness of breath, stomach pain and cramping also nausea. She was  administered 125 mg of Aminophylline IV. There were no significant changes with Lexiscan.  Quantitative spect images were obtained after a 45 minute delay.  Transient Ischemic Dilatation (Normal <1.22):  1.19 QGS EDV:  73 ml QGS ESV:  32 ml LV Ejection Fraction: 56%    Rest ECG: Non-specific ST-T wave changes and Atrial Fibrilliation  Stress ECG: No significant change from baseline ECG  QPS Raw Data Images:  Normal; no motion artifact; normal heart/lung ratio. Stress Images:  Moderate size area of moderately reduced perfusion in the mid-apical anterior wall and the apex. Rest Images:  there is substantial improvement, almost normalization of the anteroapical defect Subtraction (SDS):  These findings are consistent with ischemia. LV Wall Motion:  there is mild anteroapical hypokinesis  Impression Exercise Capacity:  Lexiscan with no exercise. BP Response:  Normal blood pressure response. Clinical Symptoms:  Atypical chest pain. ECG Impression:  No significant ECG changes with Lexiscan. Comparison with Prior Nuclear Study: the reversible defect is new compared to 2010   Overall Impression:  Intermediate risk stress nuclear study with a moderate anteroapical area of ischemia.Marland Kitchen   Sanda Klein, MD  04/29/2014 12:42 PM

## 2014-05-01 ENCOUNTER — Encounter: Payer: Self-pay | Admitting: Cardiovascular Disease

## 2014-05-01 DIAGNOSIS — R931 Abnormal findings on diagnostic imaging of heart and coronary circulation: Secondary | ICD-10-CM

## 2014-05-01 HISTORY — DX: Abnormal findings on diagnostic imaging of heart and coronary circulation: R93.1

## 2014-05-01 NOTE — Progress Notes (Signed)
Patient ID: Sandra Bradford, female   DOB: 19-Feb-1941, 74 y.o.   MRN: [id number]     Reason for office visit Abnormal nuclear stress test  Sandra Bradford presented today for a vasodilator nuclear stress test and has evidence of ischemia in the anteroapical distribution, new finding compared with her study from 2010. She remains in atrial fibrillation despite initiation of amiodarone and continues to be lethargic, although not acutely dyspneic. She is taking Xarelto for anticoagulation, so far without bleeding complications. She has normal left ventricle systolic function, a moderately dilated left atrium and no significant valvular heart disease. She does not have angina.  She was initially scheduled for cardioversion in November but canceled due to an intercurrent respiratory illness. Roughly 13 years have elapsed since her 5 vessel bypass surgery, during which time she has not had any coronary events. She has treated hypertension and is intolerant of statins (mental confusion). Her severe hypercholesterolemia is being treated with a combination of Zetia and omega-3 fatty acid supplements.  Her son-in-law, Sandra Bradford survived ruptured papillary muscle and acute mitral regurgitation last year. Both Sandra Bradford and his wife Sandra Bradford are also my patients.   Allergies  Allergen Reactions  . Codeine Nausea Only  . Statins     MYALGIA CONFUSION    Current Outpatient Prescriptions  Medication Sig Dispense Refill  . acetaminophen (TYLENOL) 500 MG tablet Take 1,000 mg by mouth daily as needed for mild pain. For pain    . amiodarone (PACERONE) 200 MG tablet Take 200 mg by mouth 2 (two) times daily.    . ergocalciferol (VITAMIN D2) 50000 UNITS capsule Take 50,000 Units by mouth once a week.    . ezetimibe (ZETIA) 10 MG tablet Take 1 tablet (10 mg total) by mouth daily. 63 tablet 0  . furosemide (LASIX) 20 MG tablet Take 10 mg by mouth 2 (two) times daily.     . Magnesium 250 MG TABS Take 250 mg by mouth daily.      . metoprolol (LOPRESSOR) 50 MG tablet Take 1 tablet (50 mg total) by mouth 2 (two) times daily. 180 tablet 3  . Multiple Vitamin (MULITIVITAMIN WITH MINERALS) TABS Take 1 tablet by mouth daily.    . quinapril (ACCUPRIL) 5 MG tablet Take 1 tablet (5 mg total) by mouth daily. 90 tablet 3  . Rivaroxaban (XARELTO) 15 MG TABS tablet Take 1 tablet (15 mg total) by mouth daily with supper. 90 tablet 3   No current facility-administered medications for this visit.    Past Medical History  Diagnosis Date  . Coronary artery disease   . Hyperlipemia   . Systemic hypertension   . Carotid arterial disease   . PAD (peripheral artery disease)   . Diverticulitis   . A-fib   . Chronic kidney disease, stage 3 02/10/2014    Past Surgical History  Procedure Laterality Date  . Tonsillectomy  1953  . Tubal ligation    . Partial hysterectomy    . Knee surgery      RIGHT  . Cardiac surgery  01/2001    LIMA TO LAD,SVG TO DIAGONAL,SVG TO MARGINAL,SVG SEQUENTALLY TO THE PD & PL VESSEL  . Cholecystectomy N/A 01/17/2013    Procedure: LAPAROSCOPIC CHOLECYSTECTOMY;  Surgeon: Donato Heinz, MD;  Location: [facility];  Service: General;  Laterality: N/A;  . Nm myocar perf wall motion  08/18/2008    No significant ischemia  . US echocardiography  07/04/2008    trace MR,mild TR  . Abdominal hysterectomy    .  Colonoscopy  July 2007    Dr. Oneida Alar: ascending and sigmoid colon diverticula  . Colonoscopy  Dec 2011    Dr. Oneida Alar: ischemic colitis. CTA performed with patent celiac, SMA, and IMA  . Colonoscopy N/A 08/06/2013    Procedure: COLONOSCOPY;  Surgeon: Daneil Dolin, MD;  Location: [facility];  Service: Endoscopy;  Laterality: N/A;  8:30    Family History  Problem Relation Age of Onset  . Heart attack Mother   . Heart attack Father   . Congestive Heart Failure Father   . Colon cancer Neg Hx     History   Social History  . Marital Status: Divorced    Spouse Name: N/A    Number of Children: N/A   . Years of Education: N/A   Occupational History  . Not on file.   Social History Main Topics  . Smoking status: Never Smoker   . Smokeless tobacco: Not on file  . Alcohol Use: Yes     Comment: beer, wine once in awhile   . Drug Use: No  . Sexual Activity: Not on file   Other Topics Concern  . Not on file   Social History Narrative    Review of systems: Palpitations daily. She has shortness of breath NYHA class II-III. The patient specifically denies any chest pain at rest or with exertion, dyspnea at rest, orthopnea, paroxysmal nocturnal dyspnea, syncope, focal neurological deficits, intermittent claudication, lower extremity edema, unexplained weight gain, cough, hemoptysis or wheezing.  The patient also denies abdominal pain, nausea, vomiting, dysphagia, diarrhea, constipation, polyuria, polydipsia, dysuria, hematuria, frequency, urgency, abnormal bleeding or bruising, fever, chills, unexpected weight changes, mood swings, change in skin or hair texture, change in voice quality, auditory or visual problems, allergic reactions or rashes, new musculoskeletal complaints other than usual "aches and pains".  PHYSICAL EXAM LMP 04/25/1978 BP 141/78, heart rate 68, weight 190 pounds, height 5 foot 2 inches, BMI 34.7 General: Alert, oriented x3, no distress Head: no evidence of trauma, PERRL, EOMI, no exophtalmos or lid lag, no myxedema, no xanthelasma; normal ears, nose and oropharynx Neck: normal jugular venous pulsations and no hepatojugular reflux; brisk carotid pulses without delay and no carotid bruits Chest: clear to auscultation, no signs of consolidation by percussion or palpation, normal fremitus, symmetrical and full respiratory excursions Cardiovascular: normal position and quality of the apical impulse, irregular rhythm, normal first and second heart sounds, no rubs or gallops, Grade 1/6 early peak in systolic ejection murmur. Abdomen: no tenderness or distention, no masses  by palpation, no abnormal pulsatility or arterial bruits, normal bowel sounds, no hepatosplenomegaly Extremities: no clubbing, cyanosis or edema; 2+ radial, ulnar and brachial pulses bilaterally; 2+ right femoral, posterior tibial and dorsalis pedis pulses; 2+ left femoral, posterior tibial and dorsalis pedis pulses; no subclavian or femoral bruits Neurological: grossly nonfocal  EKG: Atrial fibrillation  Lipid Panel     Component Value Date/Time   CHOL 236* 11/06/2012 0845   TRIG 152* 11/06/2012 0845   HDL 50 11/06/2012 0845   CHOLHDL 4.7 11/06/2012 0845   VLDL 30 11/06/2012 0845   LDLCALC 156* 11/06/2012 0845    BMET    Component Value Date/Time   NA 141 03/03/2014 1111   K 4.5 03/03/2014 1111   CL 102 03/03/2014 1111   CO2 29 03/03/2014 1111   GLUCOSE 82 03/03/2014 1111   BUN 18 03/03/2014 1111   CREATININE 1.28* 03/03/2014 1111   CREATININE 1.42* 06/10/2013 1057   CALCIUM 9.2 03/03/2014 1111  GFRNONAA 35* 06/10/2013 1057   GFRAA 41* 06/10/2013 1057     ASSESSMENT AND PLAN CAD (coronary artery disease) Currently no symptoms of angina pectoris, but she has substantial problems with dyspnea. Nuclear stress test suggests stenosis in the LAD/LIMA distribution. 13 years have passed since her bypass surgery and graft failure is a distinct possibility. Coronary angiography 30 days after cardioversion to avoid interruption of anticoagulants and reduce risk of stroke.  Atrial fibrillation She has been in atrial fibrillation after several months. If the cardioversion provides significant symptomatic improvement, continued antiarrhythmic therapy is justified. If she does not hold sinus rhythm or if there is no evidence of benefit following successful cardioversion will return to a rate control strategy. She has a history of radioactive iodine therapy but has normal TSH as of October 2015. Will need reevaluation while on amiodarone  Statin intolerance She might be a very good  candidate for Praluent or Repatha. We'll address this after we've clarified the course of treatment for her arrhythmia  Hyperlipidemia She has mixed hyperlipidemia with both substantial elevation in LDL cholesterol and very mild hypertriglyceridemia. The latter is probably related to some degree of insulin resistance in the setting of obesity and borderline elevation in glucose. She will be a good candidate for treatment with Repatha or Praluent he can obtain insurance support  Systolic murmur By recent echo there is only aortic valve sclerosis without significant stenosis  Obesity (BMI 30.0-34.9) Weight loss is recommended to help with borderline diabetes and hypertriglyceridemia.  Chronic kidney disease, stage 3 Will require intravenous fluid rehydration prior to coronary angiography  Orders Placed This Encounter  Procedures  . APTT  . Comprehensive metabolic panel  . CBC  . Protime-INR  . TSH   No orders of the defined types were placed in this encounter.    Holli Humbles, MD, [place] [phone] office [phone] pager

## 2014-05-02 ENCOUNTER — Other Ambulatory Visit: Payer: Self-pay | Admitting: *Deleted

## 2014-05-02 DIAGNOSIS — I4819 Other persistent atrial fibrillation: Secondary | ICD-10-CM

## 2014-05-06 LAB — PROTIME-INR
INR: 1.92 — ABNORMAL HIGH (ref <1.50)
PROTHROMBIN TIME: 22 s — AB (ref 11.6–15.2)

## 2014-05-06 LAB — CBC
HEMATOCRIT: 36.7 % (ref 36.0–46.0)
Hemoglobin: 12.3 g/dL (ref 12.0–15.0)
MCH: 28.9 pg (ref 26.0–34.0)
MCHC: 33.5 g/dL (ref 30.0–36.0)
MCV: 86.4 fL (ref 78.0–100.0)
MPV: 10.4 fL (ref 8.6–12.4)
Platelets: 229 10*3/uL (ref 150–400)
RBC: 4.25 MIL/uL (ref 3.87–5.11)
RDW: 15.2 % (ref 11.5–15.5)
WBC: 6.4 10*3/uL (ref 4.0–10.5)

## 2014-05-06 LAB — COMPREHENSIVE METABOLIC PANEL
ALT: 17 U/L (ref 0–35)
AST: 26 U/L (ref 0–37)
Albumin: 4 g/dL (ref 3.5–5.2)
Alkaline Phosphatase: 46 U/L (ref 39–117)
BILIRUBIN TOTAL: 0.9 mg/dL (ref 0.2–1.2)
BUN: 20 mg/dL (ref 6–23)
CALCIUM: 9.1 mg/dL (ref 8.4–10.5)
CO2: 31 mEq/L (ref 19–32)
Chloride: 102 mEq/L (ref 96–112)
Creat: 1.28 mg/dL — ABNORMAL HIGH (ref 0.50–1.10)
GLUCOSE: 87 mg/dL (ref 70–99)
POTASSIUM: 4.3 meq/L (ref 3.5–5.3)
SODIUM: 141 meq/L (ref 135–145)
Total Protein: 6.9 g/dL (ref 6.0–8.3)

## 2014-05-06 LAB — APTT: APTT: 42 s — AB (ref 24–37)

## 2014-05-06 LAB — TSH: TSH: 0.887 u[IU]/mL (ref 0.350–4.500)

## 2014-05-07 MED ORDER — SODIUM CHLORIDE 0.9 % IV SOLN: INTRAVENOUS | Status: DC

## 2014-05-08 ENCOUNTER — Ambulatory Visit (HOSPITAL_COMMUNITY): Payer: Medicare Other | Admitting: Certified Registered Nurse Anesthetist

## 2014-05-08 ENCOUNTER — Encounter (HOSPITAL_COMMUNITY): Payer: Self-pay

## 2014-05-08 ENCOUNTER — Encounter (HOSPITAL_COMMUNITY)
Admission: RE | Disposition: A | Discharge status: Home / Self Care | Payer: Self-pay | Source: Ambulatory Visit | Attending: Cardiovascular Disease

## 2014-05-08 ENCOUNTER — Ambulatory Visit (HOSPITAL_COMMUNITY)
Admission: RE | Admit: 2014-05-08 | Discharge: 2014-05-08 | Disposition: A | Discharge status: Home / Self Care | Payer: Medicare Other | Source: Ambulatory Visit | Attending: Cardiovascular Disease | Admitting: Cardiovascular Disease

## 2014-05-08 DIAGNOSIS — Z885 Allergy status to narcotic agent status: Secondary | ICD-10-CM | POA: Diagnosis not present

## 2014-05-08 DIAGNOSIS — Z800 Family history of malignant neoplasm of digestive organs: Secondary | ICD-10-CM | POA: Insufficient documentation

## 2014-05-08 DIAGNOSIS — E8881 Metabolic syndrome: Secondary | ICD-10-CM | POA: Insufficient documentation

## 2014-05-08 DIAGNOSIS — E669 Obesity, unspecified: Secondary | ICD-10-CM | POA: Diagnosis not present

## 2014-05-08 DIAGNOSIS — I2510 Atherosclerotic heart disease of native coronary artery without angina pectoris: Secondary | ICD-10-CM | POA: Insufficient documentation

## 2014-05-08 DIAGNOSIS — I129 Hypertensive chronic kidney disease with stage 1 through stage 4 chronic kidney disease, or unspecified chronic kidney disease: Secondary | ICD-10-CM | POA: Insufficient documentation

## 2014-05-08 DIAGNOSIS — I4891 Unspecified atrial fibrillation: Secondary | ICD-10-CM | POA: Insufficient documentation

## 2014-05-08 DIAGNOSIS — E782 Mixed hyperlipidemia: Secondary | ICD-10-CM | POA: Diagnosis not present

## 2014-05-08 DIAGNOSIS — N183 Chronic kidney disease, stage 3 (moderate): Secondary | ICD-10-CM | POA: Insufficient documentation

## 2014-05-08 DIAGNOSIS — I440 Atrioventricular block, first degree: Secondary | ICD-10-CM | POA: Insufficient documentation

## 2014-05-08 DIAGNOSIS — Z8249 Family history of ischemic heart disease and other diseases of the circulatory system: Secondary | ICD-10-CM | POA: Insufficient documentation

## 2014-05-08 DIAGNOSIS — I481 Persistent atrial fibrillation: Secondary | ICD-10-CM

## 2014-05-08 DIAGNOSIS — I4819 Other persistent atrial fibrillation: Secondary | ICD-10-CM | POA: Insufficient documentation

## 2014-05-08 DIAGNOSIS — Z6830 Body mass index (BMI) 30.0-30.9, adult: Secondary | ICD-10-CM | POA: Insufficient documentation

## 2014-05-08 HISTORY — PX: CARDIOVERSION: SHX1299

## 2014-05-08 SURGERY — CARDIOVERSION
Anesthesia: General

## 2014-05-08 MED ORDER — SODIUM CHLORIDE 0.9 % IV SOLN
INTRAVENOUS | Status: DC | PRN
  Administered 2014-05-08: 09:00:00 via INTRAVENOUS

## 2014-05-08 MED ORDER — METOPROLOL TARTRATE 50 MG PO TABS: 25.0000 mg | ORAL_TABLET | Freq: Two times a day (BID) | ORAL | Status: DC

## 2014-05-08 MED ORDER — PROPOFOL 10 MG/ML IV BOLUS
INTRAVENOUS | Status: DC | PRN
  Administered 2014-05-08: 60 mg via INTRAVENOUS

## 2014-05-08 MED ORDER — LIDOCAINE HCL (CARDIAC) 20 MG/ML IV SOLN
INTRAVENOUS | Status: DC | PRN
  Administered 2014-05-08: 20 mg via INTRAVENOUS

## 2014-05-08 MED ORDER — SODIUM CHLORIDE 0.9 % IV SOLN: INTRAVENOUS | Status: DC

## 2014-05-08 NOTE — Anesthesia Postprocedure Evaluation (Signed)
   Anesthesia Post-op Note  Patient: Sandra Bradford  Procedure(s) Performed: Procedure(s): CARDIOVERSION (N/A)  Patient Location: PACU and Endoscopy Unit  Anesthesia Type:General  Level of Consciousness: awake, alert , oriented and patient cooperative  Airway and Oxygen Therapy: 2L [place]  Post-op Pain: none  Post-op Assessment: Post-op Vital signs reviewed, Patient's Cardiovascular Status Stable and Respiratory Function Stable  Post-op Vital Signs: Reviewed and stable  Last Vitals:  Filed Vitals:   05/08/14 0916  BP: 110/46  Pulse: 45  Temp: 36.4 C  Resp: 12    Complications: No apparent anesthesia complications

## 2014-05-08 NOTE — Transfer of Care (Signed)
Immediate Anesthesia Transfer of Care Note  Patient: Sandra Bradford  Procedure(s) Performed: Procedure(s): CARDIOVERSION (N/A)  Patient Location: PACU and Endoscopy Unit  Anesthesia Type:General  Level of Consciousness: awake, alert , oriented and patient cooperative  Airway & Oxygen Therapy: Patient Spontanous Breathing and Patient connected to nasal cannula oxygen  Post-op Assessment:    Post vital signs: Reviewed and stable  Complications: No apparent anesthesia complications

## 2014-05-08 NOTE — H&P (View-Only) (Signed)
Patient ID: Sandra Bradford, female   DOB: 1940/05/31, 74 y.o.   MRN: [id number]     Reason for office visit Abnormal nuclear stress test  Sandra Bradford presented today for a vasodilator nuclear stress test and has evidence of ischemia in the anteroapical distribution, new finding compared with her study from 2010. She remains in atrial fibrillation despite initiation of amiodarone and continues to be lethargic, although not acutely dyspneic. She is taking Xarelto for anticoagulation, so far without bleeding complications. She has normal left ventricle systolic function, a moderately dilated left atrium and no significant valvular heart disease. She does not have angina.  She was initially scheduled for cardioversion in November but canceled due to an intercurrent respiratory illness. Roughly 13 years have elapsed since her 5 vessel bypass surgery, during which time she has not had any coronary events. She has treated hypertension and is intolerant of statins (mental confusion). Her severe hypercholesterolemia is being treated with a combination of Zetia and omega-3 fatty acid supplements.  Her son-in-law, Sandra Bradford survived ruptured papillary muscle and acute mitral regurgitation last year. Both Sandra Bradford and his wife Sandra Bradford are also my patients.   Allergies  Allergen Reactions  . Codeine Nausea Only  . Statins     MYALGIA CONFUSION    Current Outpatient Prescriptions  Medication Sig Dispense Refill  . acetaminophen (TYLENOL) 500 MG tablet Take 1,000 mg by mouth daily as needed for mild pain. For pain    . amiodarone (PACERONE) 200 MG tablet Take 200 mg by mouth 2 (two) times daily.    . ergocalciferol (VITAMIN D2) 50000 UNITS capsule Take 50,000 Units by mouth once a week.    . ezetimibe (ZETIA) 10 MG tablet Take 1 tablet (10 mg total) by mouth daily. 63 tablet 0  . furosemide (LASIX) 20 MG tablet Take 10 mg by mouth 2 (two) times daily.     . Magnesium 250 MG TABS Take 250 mg by mouth daily.      . metoprolol (LOPRESSOR) 50 MG tablet Take 1 tablet (50 mg total) by mouth 2 (two) times daily. 180 tablet 3  . Multiple Vitamin (MULITIVITAMIN WITH MINERALS) TABS Take 1 tablet by mouth daily.    . quinapril (ACCUPRIL) 5 MG tablet Take 1 tablet (5 mg total) by mouth daily. 90 tablet 3  . Rivaroxaban (XARELTO) 15 MG TABS tablet Take 1 tablet (15 mg total) by mouth daily with supper. 90 tablet 3   No current facility-administered medications for this visit.    Past Medical History  Diagnosis Date  . Coronary artery disease   . Hyperlipemia   . Systemic hypertension   . Carotid arterial disease   . PAD (peripheral artery disease)   . Diverticulitis   . A-fib   . Chronic kidney disease, stage 3 02/10/2014    Past Surgical History  Procedure Laterality Date  . Tonsillectomy  1953  . Tubal ligation    . Partial hysterectomy    . Knee surgery      RIGHT  . Cardiac surgery  01/2001    LIMA TO LAD,SVG TO DIAGONAL,SVG TO MARGINAL,SVG SEQUENTALLY TO THE PD & PL VESSEL  . Cholecystectomy N/A 01/17/2013    Procedure: LAPAROSCOPIC CHOLECYSTECTOMY;  Surgeon: Sandra Heinz, MD;  Location: [facility];  Service: General;  Laterality: N/A;  . Nm myocar perf wall motion  08/18/2008    No significant ischemia  . US echocardiography  07/04/2008    trace MR,mild TR  . Abdominal hysterectomy    .  Colonoscopy  July 2007    Dr. Oneida Bradford: ascending and sigmoid colon diverticula  . Colonoscopy  Dec 2011    Dr. Oneida Bradford: ischemic colitis. CTA performed with patent celiac, SMA, and IMA  . Colonoscopy N/A 08/06/2013    Procedure: COLONOSCOPY;  Surgeon: Sandra Dolin, MD;  Location: [facility];  Service: Endoscopy;  Laterality: N/A;  8:30    Family History  Problem Relation Age of Onset  . Heart attack Mother   . Heart attack Father   . Congestive Heart Failure Father   . Colon cancer Neg Hx     History   Social History  . Marital Status: Divorced    Spouse Name: N/A    Number of Children: N/A   . Years of Education: N/A   Occupational History  . Not on file.   Social History Main Topics  . Smoking status: Never Smoker   . Smokeless tobacco: Not on file  . Alcohol Use: Yes     Comment: beer, wine once in awhile   . Drug Use: No  . Sexual Activity: Not on file   Other Topics Concern  . Not on file   Social History Narrative    Review of systems: Palpitations daily. She has shortness of breath NYHA class II-III. The patient specifically denies any chest pain at rest or with exertion, dyspnea at rest, orthopnea, paroxysmal nocturnal dyspnea, syncope, focal neurological deficits, intermittent claudication, lower extremity edema, unexplained weight gain, cough, hemoptysis or wheezing.  The patient also denies abdominal pain, nausea, vomiting, dysphagia, diarrhea, constipation, polyuria, polydipsia, dysuria, hematuria, frequency, urgency, abnormal bleeding or bruising, fever, chills, unexpected weight changes, mood swings, change in skin or hair texture, change in voice quality, auditory or visual problems, allergic reactions or rashes, new musculoskeletal complaints other than usual "aches and pains".  PHYSICAL EXAM LMP 04/25/1978 BP 141/78, heart rate 68, weight 190 pounds, height 5 foot 2 inches, BMI 34.7 General: Alert, oriented x3, no distress Head: no evidence of trauma, PERRL, EOMI, no exophtalmos or lid lag, no myxedema, no xanthelasma; normal ears, nose and oropharynx Neck: normal jugular venous pulsations and no hepatojugular reflux; brisk carotid pulses without delay and no carotid bruits Chest: clear to auscultation, no signs of consolidation by percussion or palpation, normal fremitus, symmetrical and full respiratory excursions Cardiovascular: normal position and quality of the apical impulse, irregular rhythm, normal first and second heart sounds, no rubs or gallops, Grade 1/6 early peak in systolic ejection murmur. Abdomen: no tenderness or distention, no masses  by palpation, no abnormal pulsatility or arterial bruits, normal bowel sounds, no hepatosplenomegaly Extremities: no clubbing, cyanosis or edema; 2+ radial, ulnar and brachial pulses bilaterally; 2+ right femoral, posterior tibial and dorsalis pedis pulses; 2+ left femoral, posterior tibial and dorsalis pedis pulses; no subclavian or femoral bruits Neurological: grossly nonfocal  EKG: Atrial fibrillation  Lipid Panel     Component Value Date/Time   CHOL 236* 11/06/2012 0845   TRIG 152* 11/06/2012 0845   HDL 50 11/06/2012 0845   CHOLHDL 4.7 11/06/2012 0845   VLDL 30 11/06/2012 0845   LDLCALC 156* 11/06/2012 0845    BMET    Component Value Date/Time   NA 141 03/03/2014 1111   K 4.5 03/03/2014 1111   CL 102 03/03/2014 1111   CO2 29 03/03/2014 1111   GLUCOSE 82 03/03/2014 1111   BUN 18 03/03/2014 1111   CREATININE 1.28* 03/03/2014 1111   CREATININE 1.42* 06/10/2013 1057   CALCIUM 9.2 03/03/2014 1111  GFRNONAA 35* 06/10/2013 1057   GFRAA 41* 06/10/2013 1057     ASSESSMENT AND PLAN CAD (coronary artery disease) Currently no symptoms of angina pectoris, but she has substantial problems with dyspnea. Nuclear stress test suggests stenosis in the LAD/LIMA distribution. 13 years have passed since her bypass surgery and graft failure is a distinct possibility. Coronary angiography 30 days after cardioversion to avoid interruption of anticoagulants and reduce risk of stroke.  Atrial fibrillation She has been in atrial fibrillation after several months. If the cardioversion provides significant symptomatic improvement, continued antiarrhythmic therapy is justified. If she does not hold sinus rhythm or if there is no evidence of benefit following successful cardioversion will return to a rate control strategy. She has a history of radioactive iodine therapy but has normal TSH as of October 2015. Will need reevaluation while on amiodarone  Statin intolerance She might be a very good  candidate for Praluent or Repatha. We'll address this after we've clarified the course of treatment for her arrhythmia  Hyperlipidemia She has mixed hyperlipidemia with both substantial elevation in LDL cholesterol and very mild hypertriglyceridemia. The latter is probably related to some degree of insulin resistance in the setting of obesity and borderline elevation in glucose. She will be a good candidate for treatment with Repatha or Praluent he can obtain insurance support  Systolic murmur By recent echo there is only aortic valve sclerosis without significant stenosis  Obesity (BMI 30.0-34.9) Weight loss is recommended to help with borderline diabetes and hypertriglyceridemia.  Chronic kidney disease, stage 3 Will require intravenous fluid rehydration prior to coronary angiography  Orders Placed This Encounter  Procedures  . APTT  . Comprehensive metabolic panel  . CBC  . Protime-INR  . TSH   No orders of the defined types were placed in this encounter.    Holli Humbles, MD, [place] [phone] office [phone] pager

## 2014-05-08 NOTE — Interval H&P Note (Signed)
History and Physical Interval Note:  05/08/2014 7:53 AM  Sandra Bradford  has presented today for surgery, with the diagnosis of AFIB  The various methods of treatment have been discussed with the patient and family. After consideration of risks, benefits and other options for treatment, the patient has consented to  Procedure(s): CARDIOVERSION (N/A) as a surgical intervention .  The patient's history has been reviewed, patient examined, no change in status, stable for surgery.  I have reviewed the patient's chart and labs.  Questions were answered to the patient's satisfaction.     Tou Hayner

## 2014-05-08 NOTE — Discharge Instructions (Signed)

## 2014-05-08 NOTE — Op Note (Signed)
Procedure: Electrical Cardioversion Indications:  Atrial Fibrillation  Procedure Details:  Consent: Risks of procedure as well as the alternatives and risks of each were explained to the (patient/caregiver).  Consent for procedure obtained.  Time Out: Verified patient identification, verified procedure, site/side was marked, verified correct patient position, special equipment/implants available, medications/allergies/relevent history reviewed, required imaging and test results available.  Performed  Patient placed on cardiac monitor, pulse oximetry, supplemental oxygen as necessary.  Sedation given: Propofol IV, Dr. Glennon Mac Pacer pads placed anterior and posterior chest.  Cardioverted 1 time(s).  Cardioverted at 120J. Synchronized biphasic  Evaluation: Findings: Post procedure EKG shows: Sinus bradycardia 45 bpm, rare PACs Complications: None Patient did tolerate procedure well.  Reduce metoprolol 25 mg BID. Cardiac cath in 30 days for ischemia on nuclear stress test.  Time Spent Directly with the Patient:  60 minutes   Sandra Bradford 05/08/2014, 9:20 AM

## 2014-05-08 NOTE — Anesthesia Preprocedure Evaluation (Addendum)
Anesthesia Evaluation  Patient identified by MRN, date of birth, ID band Patient awake    Reviewed: Allergy & Precautions, NPO status , Patient's Chart, lab work & pertinent test results, reviewed documented beta blocker date and time   History of Anesthesia Complications Negative for: history of anesthetic complications  Airway Mallampati: I  TM Distance: >3 FB Neck ROM: Full    Dental  (+) Teeth Intact, Caps, Dental Advisory Given   Pulmonary neg pulmonary ROS,  breath sounds clear to auscultation        Cardiovascular hypertension, Pt. on medications and Pt. on home beta blockers - angina+ CAD, + CABG (5 vessel CABG 2002) and + Peripheral Vascular Disease + dysrhythmias Atrial Fibrillation Rhythm:Irregular Rate:Normal  1/16 stress test: Intermediate risk stress nuclear study with a moderate anteroapical area of ischemia 10/15 ECHO: EF 55%, mild MR, mod TR   Neuro/Psych negative neurological ROS     GI/Hepatic Neg liver ROS, GERD-  Controlled,  Endo/Other  Morbid obesity  Renal/GU Renal InsufficiencyRenal disease (creat 1.28)     Musculoskeletal   Abdominal (+) + obese,   Peds  Hematology  (+) Blood dyscrasia (INR 1.92), ,   Anesthesia Other Findings   Reproductive/Obstetrics                         Anesthesia Physical Anesthesia Plan  ASA: III  Anesthesia Plan: General   Post-op Pain Management:    Induction: Intravenous  Airway Management Planned: Mask and Natural Airway  Additional Equipment:   Intra-op Plan:   Post-operative Plan:   Informed Consent: I have reviewed the patients History and Physical, chart, labs and discussed the procedure including the risks, benefits and alternatives for the proposed anesthesia with the patient or authorized representative who has indicated his/her understanding and acceptance.   Dental advisory given  Plan Discussed with: CRNA and  Surgeon  Anesthesia Plan Comments: (Plan routine monitors, GA for cardioversion)        Anesthesia Quick Evaluation

## 2014-05-09 ENCOUNTER — Encounter (HOSPITAL_COMMUNITY): Payer: Self-pay | Admitting: Cardiovascular Disease

## 2014-05-13 ENCOUNTER — Ambulatory Visit (INDEPENDENT_AMBULATORY_CARE_PROVIDER_SITE_OTHER): Payer: Medicare Other | Admitting: Physician Assistant

## 2014-05-13 ENCOUNTER — Encounter: Payer: Self-pay | Admitting: Physician Assistant

## 2014-05-13 ENCOUNTER — Encounter: Payer: Self-pay | Admitting: Cardiovascular Disease

## 2014-05-13 VITALS — BP 150/64 | HR 48 | Ht 62.0 in | Wt 187.7 lb

## 2014-05-13 DIAGNOSIS — I872 Venous insufficiency (chronic) (peripheral): Secondary | ICD-10-CM

## 2014-05-13 DIAGNOSIS — R9439 Abnormal result of other cardiovascular function study: Secondary | ICD-10-CM

## 2014-05-13 DIAGNOSIS — Z889 Allergy status to unspecified drugs, medicaments and biological substances status: Secondary | ICD-10-CM

## 2014-05-13 DIAGNOSIS — Z79899 Other long term (current) drug therapy: Secondary | ICD-10-CM

## 2014-05-13 DIAGNOSIS — Z789 Other specified health status: Secondary | ICD-10-CM

## 2014-05-13 DIAGNOSIS — R931 Abnormal findings on diagnostic imaging of heart and coronary circulation: Secondary | ICD-10-CM

## 2014-05-13 DIAGNOSIS — N183 Chronic kidney disease, stage 3 unspecified: Secondary | ICD-10-CM

## 2014-05-13 DIAGNOSIS — Z9889 Other specified postprocedural states: Secondary | ICD-10-CM

## 2014-05-13 DIAGNOSIS — D689 Coagulation defect, unspecified: Secondary | ICD-10-CM

## 2014-05-13 DIAGNOSIS — I480 Paroxysmal atrial fibrillation: Secondary | ICD-10-CM

## 2014-05-13 DIAGNOSIS — I2510 Atherosclerotic heart disease of native coronary artery without angina pectoris: Secondary | ICD-10-CM

## 2014-05-13 DIAGNOSIS — E785 Hyperlipidemia, unspecified: Secondary | ICD-10-CM

## 2014-05-13 DIAGNOSIS — I10 Essential (primary) hypertension: Secondary | ICD-10-CM

## 2014-05-13 DIAGNOSIS — Z01810 Encounter for preprocedural cardiovascular examination: Secondary | ICD-10-CM

## 2014-05-13 DIAGNOSIS — I2583 Coronary atherosclerosis due to lipid rich plaque: Secondary | ICD-10-CM

## 2014-05-13 MED ORDER — QUINAPRIL HCL 10 MG PO TABS: 10.0000 mg | ORAL_TABLET | Freq: Every day | ORAL | Status: DC

## 2014-05-13 NOTE — Progress Notes (Signed)
Patient ID: Sandra Bradford, female   DOB: 1940/08/31, 74 y.o.   MRN: [id number]    Date:  05/13/2014   ID:  Sandra Bradford, DOB 16-Oct-1940, MRN [id number]  PCP:  Purvis Kilts, MD  Primary Cardiologist:  Croitoru  Chief Complaint  Patient presents with  . Follow-up    post cardioversion.     History of Present Illness: Sandra Bradford is a 74 y.o. female with a history of coronary artery disease status post coronary bypass grafting 5, atrial fibrillation, chronic kidney disease stage III, hypertension, hyperlipidemia, obesity, peripheral artery disease, statin intolerance.  She underwent direct current cardioversion on 05/08/2014. She resumed sinus bradycardia with a rate of 45 bpm with some PACs. Her metoprolol was reduced 25 mg twice daily.  She underwent a nuclear stress test which was read as intermediate risk with moderate anteroapical ischemia.  She presents for a post cardioversion EKG and follow-up. She reports developing a stiff neck and pain last weekend. She had a tick bite back in December and is wondering if it's related. Is also reported headache the same time the maximum pain level of 8 out of 10 in intensity. Says the pain is Sharp and only lasts about 2 seconds.  She also reports couple instances of nausea. She reports shortness of breath when she walks mailbox with stairs but not with her walking around the house. She denies chest pain, dizziness, vomiting or fever, arthralgia beyond usual, unusual rashes.  She has chronic lower extremity edema for which she takes 20 mg of Lasix daily.  She also denies orthopnea, dizziness, PND, cough, congestion, abdominal pain, hematochezia, melena,claudication.  Wt Readings from Last 3 Encounters:  05/13/14 187 lb 11.2 oz (85.14 kg)  05/08/14 185 lb (83.915 kg)  04/02/14 190 lb (86.183 kg)     Past Medical History  Diagnosis Date  . Coronary artery disease   . Hyperlipemia   . Systemic hypertension   . Carotid arterial disease   . PAD  (peripheral artery disease)   . Diverticulitis   . A-fib   . Chronic kidney disease, stage 3 02/10/2014  . Abnormal nuclear cardiac imaging test 05/01/2014    Current Outpatient Prescriptions  Medication Sig Dispense Refill  . acetaminophen (TYLENOL) 500 MG tablet Take 1,000 mg by mouth daily as needed for mild pain.     Marland Kitchen amiodarone (PACERONE) 200 MG tablet Take 200 mg by mouth 2 (two) times daily.    . Cholecalciferol (VITAMIN D) 2000 UNITS CAPS Take 2,000 Units by mouth daily.    Marland Kitchen ezetimibe (ZETIA) 10 MG tablet Take 1 tablet (10 mg total) by mouth daily. 63 tablet 0  . furosemide (LASIX) 20 MG tablet Take 20 mg by mouth daily.     . metoprolol (LOPRESSOR) 50 MG tablet Take 0.5 tablets (25 mg total) by mouth 2 (two) times daily. 180 tablet 3  . Multiple Vitamin (MULITIVITAMIN WITH MINERALS) TABS Take 1 tablet by mouth daily.    . Rivaroxaban (XARELTO) 15 MG TABS tablet Take 1 tablet (15 mg total) by mouth daily with supper. 90 tablet 3  . quinapril (ACCUPRIL) 10 MG tablet Take 1 tablet (10 mg total) by mouth daily. 90 tablet 3   No current facility-administered medications for this visit.    Allergies:    Allergies  Allergen Reactions  . Codeine Nausea Only  . Statins     MYALGIA CONFUSION    Social History:  The patient  reports that she has never  smoked. She does not have any smokeless tobacco history on file. She reports that she drinks alcohol. She reports that she does not use illicit drugs.   Family history:   Family History  Problem Relation Age of Onset  . Heart attack Mother   . Heart attack Father   . Congestive Heart Failure Father   . Colon cancer Neg Hx     ROS:  Please see the history of present illness.  All other systems reviewed and negative.   PHYSICAL EXAM: VS:  BP 150/64 mmHg  Pulse 48  Ht 5\' 2"  (1.575 m)  Wt 187 lb 11.2 oz (85.14 kg)  BMI 34.32 kg/m2  LMP 04/25/1978 obese, well developed, in no acute distress HEENT: Pupils are equal round  react to light accommodation extraocular movements are intact.  Neck: no JVDNo cervical lymphadenopathy. Cardiac: Regular rhythm, rate slow without murmurs rubs or gallops. Lungs:  clear to auscultation bilaterally, no wheezing, rhonchi or rales Abd: soft, nontender, positive bowel sounds all quadrants, no hepatosplenomegaly MS:  Her neck along the SCMs is very tender and painful with active motion.   Ext: 1+  lower extremity edema > .  2+ radial and dorsalis pedis pulses. Skin: warm and dry.  No rash surrounding the area of her tick bite from Dec Neuro:  Grossly normal, CNII-XII intact.  EKG:    Sinus bradycardia rate 48 bpm  ASSESSMENT AND PLAN:  Problem List Items Addressed This Visit    Venous insufficiency, peripheral    She says wearing compression socks makes her lower extremity edema worse. Continue current dose of Lasix.      Relevant Medications   quinapril (ACCUPRIL) tablet   Statin intolerance   Hyperlipidemia    Statin intolerance. Continue zetia        Relevant Medications   quinapril (ACCUPRIL) tablet   HTN (hypertension)    Blood pressure is not well-controlled. Increase quinapril to 10 mg daily.      Relevant Medications   quinapril (ACCUPRIL) tablet   Chronic kidney disease, stage 3    Will check precath labs.  She will need post Hydration.      CAD (coronary artery disease)    Cardiac cath planned for one month      Relevant Medications   quinapril (ACCUPRIL) tablet   Atrial fibrillation    Maintaining sinus bradycardia rate of 48 beats per minute. She appears to be asymptomatic. Continue current dose of metoprolol.      Relevant Medications   quinapril (ACCUPRIL) tablet   Abnormal nuclear cardiac imaging test    We are planning cardiac catheterization one month after cardioversion.       Other Visit Diagnoses    Abnormal stress test    -  Primary    Relevant Orders    LEFT HEART CATHETERIZATION WITH CORONARY ANGIOGRAM    History of  cardioversion        Relevant Orders    EKG 12-Lead    Preprocedural cardiovascular examination        Relevant Orders    INR/PT    PTT    CBC    Basic Metabolic Panel (BMET)    TSH    Polypharmacy        Relevant Orders    CBC    Basic Metabolic Panel (BMET)    TSH    Blood clotting disorder        Relevant Orders    INR/PT    PTT

## 2014-05-13 NOTE — Assessment & Plan Note (Signed)
She says wearing compression socks makes her lower extremity edema worse. Continue current dose of Lasix.

## 2014-05-13 NOTE — Assessment & Plan Note (Signed)
Blood pressure is not well-controlled. Increase quinapril to 10 mg daily.

## 2014-05-13 NOTE — Patient Instructions (Addendum)
Your physician has requested that you have a cardiac catheterization. Cardiac catheterization is used to diagnose and/or treat various heart conditions. Doctors may recommend this procedure for a number of different reasons. The most common reason is to evaluate chest pain. Chest pain can be a symptom of coronary artery disease (CAD), and cardiac catheterization can show whether plaque is narrowing or blocking your heart's arteries. This procedure is also used to evaluate the valves, as well as measure the blood flow and oxygen levels in different parts of your heart. For further information please visit [email or web address]. Please follow instruction sheet, as given. This should be done with Dr.Croitoru the week of February 15th.     INCREASE your Qunapril to 10mg  Daily

## 2014-05-13 NOTE — Assessment & Plan Note (Signed)
Will check precath labs.  She will need post Hydration.

## 2014-05-13 NOTE — Assessment & Plan Note (Signed)
We are planning cardiac catheterization one month after cardioversion.

## 2014-05-13 NOTE — Assessment & Plan Note (Signed)
Cardiac cath planned for one month

## 2014-05-13 NOTE — Assessment & Plan Note (Signed)
Statin intolerance. Continue zetia

## 2014-05-13 NOTE — Assessment & Plan Note (Signed)
Maintaining sinus bradycardia rate of 48 beats per minute. She appears to be asymptomatic. Continue current dose of metoprolol.

## 2014-05-26 ENCOUNTER — Other Ambulatory Visit: Payer: Self-pay | Admitting: *Deleted

## 2014-05-26 DIAGNOSIS — R931 Abnormal findings on diagnostic imaging of heart and coronary circulation: Secondary | ICD-10-CM

## 2014-05-29 ENCOUNTER — Other Ambulatory Visit: Payer: Self-pay | Admitting: *Deleted

## 2014-05-29 DIAGNOSIS — R9439 Abnormal result of other cardiovascular function study: Secondary | ICD-10-CM

## 2014-06-02 ENCOUNTER — Other Ambulatory Visit: Payer: Self-pay

## 2014-06-02 MED ORDER — RIVAROXABAN 15 MG PO TABS: 15.0000 mg | ORAL_TABLET | Freq: Every day | ORAL | Status: DC

## 2014-06-04 ENCOUNTER — Encounter (HOSPITAL_COMMUNITY): Payer: Self-pay | Admitting: Pharmacy Technician

## 2014-06-05 LAB — CBC
HCT: 39.8 % (ref 36.0–46.0)
Hemoglobin: 12.8 g/dL (ref 12.0–15.0)
MCH: 28.6 pg (ref 26.0–34.0)
MCHC: 32.2 g/dL (ref 30.0–36.0)
MCV: 88.8 fL (ref 78.0–100.0)
MPV: 11.6 fL (ref 8.6–12.4)
Platelets: 262 10*3/uL (ref 150–400)
RBC: 4.48 MIL/uL (ref 3.87–5.11)
RDW: 15.7 % — ABNORMAL HIGH (ref 11.5–15.5)
WBC: 6.6 10*3/uL (ref 4.0–10.5)

## 2014-06-05 LAB — PROTIME-INR
INR: 2.71 — ABNORMAL HIGH (ref <1.50)
Prothrombin Time: 28.8 seconds — ABNORMAL HIGH (ref 11.6–15.2)

## 2014-06-05 LAB — BASIC METABOLIC PANEL
BUN: 25 mg/dL — ABNORMAL HIGH (ref 6–23)
CO2: 29 meq/L (ref 19–32)
Calcium: 9.6 mg/dL (ref 8.4–10.5)
Chloride: 101 mEq/L (ref 96–112)
Creat: 1.56 mg/dL — ABNORMAL HIGH (ref 0.50–1.10)
GLUCOSE: 86 mg/dL (ref 70–99)
Potassium: 5 mEq/L (ref 3.5–5.3)
Sodium: 138 mEq/L (ref 135–145)

## 2014-06-05 LAB — TSH: TSH: 0.544 u[IU]/mL (ref 0.350–4.500)

## 2014-06-05 LAB — APTT: aPTT: 47 seconds — ABNORMAL HIGH (ref 24–37)

## 2014-06-10 ENCOUNTER — Ambulatory Visit (HOSPITAL_COMMUNITY)
Admission: RE | Admit: 2014-06-10 | Discharge: 2014-06-11 | Disposition: A | Discharge status: Home / Self Care | Payer: Medicare Other | Source: Ambulatory Visit | Attending: Cardiovascular Disease | Admitting: Cardiovascular Disease

## 2014-06-10 ENCOUNTER — Encounter (HOSPITAL_COMMUNITY): Payer: Self-pay | Admitting: General Practice

## 2014-06-10 ENCOUNTER — Encounter (HOSPITAL_COMMUNITY)
Admission: RE | Disposition: A | Discharge status: Home / Self Care | Payer: Self-pay | Source: Ambulatory Visit | Attending: Cardiovascular Disease

## 2014-06-10 DIAGNOSIS — I2510 Atherosclerotic heart disease of native coronary artery without angina pectoris: Secondary | ICD-10-CM | POA: Diagnosis present

## 2014-06-10 DIAGNOSIS — I25708 Atherosclerosis of coronary artery bypass graft(s), unspecified, with other forms of angina pectoris: Secondary | ICD-10-CM

## 2014-06-10 DIAGNOSIS — I10 Essential (primary) hypertension: Secondary | ICD-10-CM | POA: Diagnosis present

## 2014-06-10 DIAGNOSIS — R931 Abnormal findings on diagnostic imaging of heart and coronary circulation: Secondary | ICD-10-CM | POA: Diagnosis present

## 2014-06-10 DIAGNOSIS — N189 Chronic kidney disease, unspecified: Secondary | ICD-10-CM | POA: Diagnosis present

## 2014-06-10 DIAGNOSIS — E669 Obesity, unspecified: Secondary | ICD-10-CM | POA: Diagnosis not present

## 2014-06-10 DIAGNOSIS — R9439 Abnormal result of other cardiovascular function study: Secondary | ICD-10-CM

## 2014-06-10 DIAGNOSIS — N183 Chronic kidney disease, stage 3 (moderate): Secondary | ICD-10-CM | POA: Diagnosis not present

## 2014-06-10 DIAGNOSIS — N179 Acute kidney failure, unspecified: Secondary | ICD-10-CM

## 2014-06-10 DIAGNOSIS — N289 Disorder of kidney and ureter, unspecified: Secondary | ICD-10-CM

## 2014-06-10 DIAGNOSIS — I25810 Atherosclerosis of coronary artery bypass graft(s) without angina pectoris: Secondary | ICD-10-CM | POA: Insufficient documentation

## 2014-06-10 DIAGNOSIS — E66811 Obesity, class 1: Secondary | ICD-10-CM | POA: Diagnosis present

## 2014-06-10 DIAGNOSIS — I480 Paroxysmal atrial fibrillation: Secondary | ICD-10-CM | POA: Diagnosis not present

## 2014-06-10 DIAGNOSIS — I129 Hypertensive chronic kidney disease with stage 1 through stage 4 chronic kidney disease, or unspecified chronic kidney disease: Secondary | ICD-10-CM | POA: Diagnosis not present

## 2014-06-10 DIAGNOSIS — E785 Hyperlipidemia, unspecified: Secondary | ICD-10-CM | POA: Diagnosis not present

## 2014-06-10 DIAGNOSIS — Z7901 Long term (current) use of anticoagulants: Secondary | ICD-10-CM

## 2014-06-10 DIAGNOSIS — Z789 Other specified health status: Secondary | ICD-10-CM

## 2014-06-10 HISTORY — DX: Unspecified chronic bronchitis: J42

## 2014-06-10 HISTORY — DX: Personal history of other medical treatment: Z92.89

## 2014-06-10 HISTORY — DX: Squamous cell carcinoma of skin of lip: C44.02

## 2014-06-10 HISTORY — DX: Acute embolism and thrombosis of unspecified deep veins of unspecified lower extremity: I82.409

## 2014-06-10 HISTORY — DX: Unspecified osteoarthritis, unspecified site: M19.90

## 2014-06-10 HISTORY — DX: Cardiac murmur, unspecified: R01.1

## 2014-06-10 HISTORY — DX: Nausea with vomiting, unspecified: R11.2

## 2014-06-10 HISTORY — DX: Pneumonia, unspecified organism: J18.9

## 2014-06-10 HISTORY — DX: Other specified postprocedural states: Z98.890

## 2014-06-10 LAB — BASIC METABOLIC PANEL
Anion gap: 11 (ref 5–15)
BUN: 27 mg/dL — ABNORMAL HIGH (ref 6–23)
CALCIUM: 9.4 mg/dL (ref 8.4–10.5)
CO2: 26 mmol/L (ref 19–32)
CREATININE: 1.76 mg/dL — AB (ref 0.50–1.10)
Chloride: 104 mmol/L (ref 96–112)
GFR calc Af Amer: 31 mL/min — ABNORMAL LOW (ref >90)
GFR, EST NON AFRICAN AMERICAN: 27 mL/min — AB (ref >90)
Glucose, Bld: 95 mg/dL (ref 70–99)
Potassium: 3.8 mmol/L (ref 3.5–5.1)
SODIUM: 141 mmol/L (ref 135–145)

## 2014-06-10 LAB — PROTIME-INR
INR: 1.07 (ref 0.00–1.49)
Prothrombin Time: 14 seconds (ref 11.6–15.2)

## 2014-06-10 SURGERY — LEFT HEART CATHETERIZATION WITH CORONARY/GRAFT ANGIOGRAM
Anesthesia: LOCAL

## 2014-06-10 MED ORDER — SODIUM CHLORIDE 0.9 % IV SOLN
INTRAVENOUS | Status: DC
  Administered 2014-06-10: 13:00:00 via INTRAVENOUS

## 2014-06-10 MED ORDER — AMIODARONE HCL 200 MG PO TABS
200.0000 mg | ORAL_TABLET | Freq: Two times a day (BID) | ORAL | Status: DC
  Administered 2014-06-10 – 2014-06-11 (×2): 200 mg via ORAL
  Filled 2014-06-10 (×3): qty 1

## 2014-06-10 MED ORDER — SODIUM CHLORIDE 0.9 % IV SOLN
INTRAVENOUS | Status: DC
  Administered 2014-06-10: 16:00:00 via INTRAVENOUS

## 2014-06-10 MED ORDER — EZETIMIBE 10 MG PO TABS
10.0000 mg | ORAL_TABLET | Freq: Every day | ORAL | Status: DC
  Administered 2014-06-10 – 2014-06-11 (×2): 10 mg via ORAL
  Filled 2014-06-10 (×2): qty 1

## 2014-06-10 MED ORDER — SODIUM CHLORIDE 0.9 % IJ SOLN
3.0000 mL | INTRAMUSCULAR | Status: DC | PRN
  Administered 2014-06-11: 08:00:00 3 mL via INTRAVENOUS
  Filled 2014-06-10: qty 3

## 2014-06-10 MED ORDER — METOPROLOL TARTRATE 25 MG PO TABS
25.0000 mg | ORAL_TABLET | Freq: Two times a day (BID) | ORAL | Status: DC
  Administered 2014-06-10: 25 mg via ORAL
  Filled 2014-06-10: qty 1

## 2014-06-10 MED ORDER — ACETAMINOPHEN 500 MG PO TABS: 1000.0000 mg | ORAL_TABLET | Freq: Every day | ORAL | Status: DC | PRN

## 2014-06-10 MED ORDER — ASPIRIN 81 MG PO CHEW
CHEWABLE_TABLET | ORAL | Status: AC
  Filled 2014-06-10: qty 1

## 2014-06-10 MED ORDER — ASPIRIN 81 MG PO CHEW
81.0000 mg | CHEWABLE_TABLET | ORAL | Status: AC
  Administered 2014-06-10: 81 mg via ORAL

## 2014-06-10 NOTE — H&P (View-Only) (Signed)
Patient ID: Sandra Bradford, female   DOB: Sep 06, 1940, 74 y.o.   MRN: [id number]    Date:  05/13/2014   ID:  Sandra Bradford, DOB 1941-04-20, MRN [id number]  PCP:  Purvis Kilts, MD  Primary Cardiologist:  Croitoru  Chief Complaint  Patient presents with  . Follow-up    post cardioversion.     History of Present Illness: Sandra Bradford is a 75 y.o. female with a history of coronary artery disease status post coronary bypass grafting 5, atrial fibrillation, chronic kidney disease stage III, hypertension, hyperlipidemia, obesity, peripheral artery disease, statin intolerance.  She underwent direct current cardioversion on 05/08/2014. She resumed sinus bradycardia with a rate of 45 bpm with some PACs. Her metoprolol was reduced 25 mg twice daily.  She underwent a nuclear stress test which was read as intermediate risk with moderate anteroapical ischemia.  She presents for a post cardioversion EKG and follow-up. She reports developing a stiff neck and pain last weekend. She had a tick bite back in December and is wondering if it's related. Is also reported headache the same time the maximum pain level of 8 out of 10 in intensity. Says the pain is Sharp and only lasts about 2 seconds.  She also reports couple instances of nausea. She reports shortness of breath when she walks mailbox with stairs but not with her walking around the house. She denies chest pain, dizziness, vomiting or fever, arthralgia beyond usual, unusual rashes.  She has chronic lower extremity edema for which she takes 20 mg of Lasix daily.  She also denies orthopnea, dizziness, PND, cough, congestion, abdominal pain, hematochezia, melena,claudication.  Wt Readings from Last 3 Encounters:  05/13/14 187 lb 11.2 oz (85.14 kg)  05/08/14 185 lb (83.915 kg)  04/02/14 190 lb (86.183 kg)     Past Medical History  Diagnosis Date  . Coronary artery disease   . Hyperlipemia   . Systemic hypertension   . Carotid arterial disease   . PAD  (peripheral artery disease)   . Diverticulitis   . A-fib   . Chronic kidney disease, stage 3 02/10/2014  . Abnormal nuclear cardiac imaging test 05/01/2014    Current Outpatient Prescriptions  Medication Sig Dispense Refill  . acetaminophen (TYLENOL) 500 MG tablet Take 1,000 mg by mouth daily as needed for mild pain.     Marland Kitchen amiodarone (PACERONE) 200 MG tablet Take 200 mg by mouth 2 (two) times daily.    . Cholecalciferol (VITAMIN D) 2000 UNITS CAPS Take 2,000 Units by mouth daily.    Marland Kitchen ezetimibe (ZETIA) 10 MG tablet Take 1 tablet (10 mg total) by mouth daily. 63 tablet 0  . furosemide (LASIX) 20 MG tablet Take 20 mg by mouth daily.     . metoprolol (LOPRESSOR) 50 MG tablet Take 0.5 tablets (25 mg total) by mouth 2 (two) times daily. 180 tablet 3  . Multiple Vitamin (MULITIVITAMIN WITH MINERALS) TABS Take 1 tablet by mouth daily.    . Rivaroxaban (XARELTO) 15 MG TABS tablet Take 1 tablet (15 mg total) by mouth daily with supper. 90 tablet 3  . quinapril (ACCUPRIL) 10 MG tablet Take 1 tablet (10 mg total) by mouth daily. 90 tablet 3   No current facility-administered medications for this visit.    Allergies:    Allergies  Allergen Reactions  . Codeine Nausea Only  . Statins     MYALGIA CONFUSION    Social History:  The patient  reports that she has never  smoked. She does not have any smokeless tobacco history on file. She reports that she drinks alcohol. She reports that she does not use illicit drugs.   Family history:   Family History  Problem Relation Age of Onset  . Heart attack Mother   . Heart attack Father   . Congestive Heart Failure Father   . Colon cancer Neg Hx     ROS:  Please see the history of present illness.  All other systems reviewed and negative.   PHYSICAL EXAM: VS:  BP 150/64 mmHg  Pulse 48  Ht 5\' 2"  (1.575 m)  Wt 187 lb 11.2 oz (85.14 kg)  BMI 34.32 kg/m2  LMP 04/25/1978 obese, well developed, in no acute distress HEENT: Pupils are equal round  react to light accommodation extraocular movements are intact.  Neck: no JVDNo cervical lymphadenopathy. Cardiac: Regular rhythm, rate slow without murmurs rubs or gallops. Lungs:  clear to auscultation bilaterally, no wheezing, rhonchi or rales Abd: soft, nontender, positive bowel sounds all quadrants, no hepatosplenomegaly MS:  Her neck along the SCMs is very tender and painful with active motion.   Ext: 1+  lower extremity edema > .  2+ radial and dorsalis pedis pulses. Skin: warm and dry.  No rash surrounding the area of her tick bite from Dec Neuro:  Grossly normal, CNII-XII intact.  EKG:    Sinus bradycardia rate 48 bpm  ASSESSMENT AND PLAN:  Problem List Items Addressed This Visit    Venous insufficiency, peripheral    She says wearing compression socks makes her lower extremity edema worse. Continue current dose of Lasix.      Relevant Medications   quinapril (ACCUPRIL) tablet   Statin intolerance   Hyperlipidemia    Statin intolerance. Continue zetia        Relevant Medications   quinapril (ACCUPRIL) tablet   HTN (hypertension)    Blood pressure is not well-controlled. Increase quinapril to 10 mg daily.      Relevant Medications   quinapril (ACCUPRIL) tablet   Chronic kidney disease, stage 3    Will check precath labs.  She will need post Hydration.      CAD (coronary artery disease)    Cardiac cath planned for one month      Relevant Medications   quinapril (ACCUPRIL) tablet   Atrial fibrillation    Maintaining sinus bradycardia rate of 48 beats per minute. She appears to be asymptomatic. Continue current dose of metoprolol.      Relevant Medications   quinapril (ACCUPRIL) tablet   Abnormal nuclear cardiac imaging test    We are planning cardiac catheterization one month after cardioversion.       Other Visit Diagnoses    Abnormal stress test    -  Primary    Relevant Orders    LEFT HEART CATHETERIZATION WITH CORONARY ANGIOGRAM    History of  cardioversion        Relevant Orders    EKG 12-Lead    Preprocedural cardiovascular examination        Relevant Orders    INR/PT    PTT    CBC    Basic Metabolic Panel (BMET)    TSH    Polypharmacy        Relevant Orders    CBC    Basic Metabolic Panel (BMET)    TSH    Blood clotting disorder        Relevant Orders    INR/PT    PTT

## 2014-06-10 NOTE — Interval H&P Note (Signed)
History and Physical Interval Note:  06/10/2014 11:26 AM  Sandra Bradford  has presented today for surgery, with the diagnosis of Chest pain  The various methods of treatment have been discussed with the patient and family. After consideration of risks, benefits and other options for treatment, the patient has consented to  Procedure(s): LEFT HEART CATHETERIZATION WITH CORONARY/GRAFT ANGIOGRAM (N/A) as a surgical intervention .  The patient's history has been reviewed, patient examined, no change in status, stable for surgery.  I have reviewed the patient's chart and labs.  Questions were answered to the patient's satisfaction.     Anil Havard

## 2014-06-10 NOTE — Progress Notes (Signed)
Report called to Jay,R.N. Pt transported to 6C06. Pt and family kept informed.

## 2014-06-10 NOTE — Progress Notes (Signed)
Noted to have elevated creatinine on preoperative labs, above baseline level of 1.4. Took furosemide earlier today. ACE inhibitor dose increased at last office visit. Will admit for IV fluids and reschedule cath for AM with Dr. Martinique  Kristena Wilhelmi, MD, [facility] [phone] office [phone] pager

## 2014-06-11 ENCOUNTER — Telehealth: Payer: Self-pay | Admitting: Cardiovascular Disease

## 2014-06-11 ENCOUNTER — Encounter (HOSPITAL_COMMUNITY): Payer: Self-pay | Admitting: Cardiology

## 2014-06-11 ENCOUNTER — Encounter (HOSPITAL_COMMUNITY)
Admission: RE | Disposition: A | Discharge status: Home / Self Care | Payer: Self-pay | Source: Ambulatory Visit | Attending: Cardiovascular Disease

## 2014-06-11 ENCOUNTER — Ambulatory Visit (HOSPITAL_COMMUNITY): Admission: RE | Admit: 2014-06-11 | Payer: Medicare Other | Source: Ambulatory Visit | Admitting: Cardiology

## 2014-06-11 DIAGNOSIS — Z7901 Long term (current) use of anticoagulants: Secondary | ICD-10-CM | POA: Diagnosis not present

## 2014-06-11 DIAGNOSIS — I25810 Atherosclerosis of coronary artery bypass graft(s) without angina pectoris: Secondary | ICD-10-CM | POA: Diagnosis not present

## 2014-06-11 DIAGNOSIS — I2510 Atherosclerotic heart disease of native coronary artery without angina pectoris: Secondary | ICD-10-CM

## 2014-06-11 DIAGNOSIS — E785 Hyperlipidemia, unspecified: Secondary | ICD-10-CM | POA: Diagnosis not present

## 2014-06-11 DIAGNOSIS — I480 Paroxysmal atrial fibrillation: Secondary | ICD-10-CM

## 2014-06-11 HISTORY — PX: LEFT HEART CATHETERIZATION WITH CORONARY/GRAFT ANGIOGRAM: SHX5450

## 2014-06-11 LAB — BASIC METABOLIC PANEL
ANION GAP: 3 — AB (ref 5–15)
BUN: 28 mg/dL — AB (ref 6–23)
CHLORIDE: 105 mmol/L (ref 96–112)
CO2: 28 mmol/L (ref 19–32)
CREATININE: 1.63 mg/dL — AB (ref 0.50–1.10)
Calcium: 8.3 mg/dL — ABNORMAL LOW (ref 8.4–10.5)
GFR calc Af Amer: 34 mL/min — ABNORMAL LOW (ref >90)
GFR calc non Af Amer: 30 mL/min — ABNORMAL LOW (ref >90)
Glucose, Bld: 95 mg/dL (ref 70–99)
Potassium: 4.3 mmol/L (ref 3.5–5.1)
Sodium: 136 mmol/L (ref 135–145)

## 2014-06-11 SURGERY — LEFT HEART CATHETERIZATION WITH CORONARY/GRAFT ANGIOGRAM

## 2014-06-11 MED ORDER — HEPARIN (PORCINE) IN NACL 2-0.9 UNIT/ML-% IJ SOLN
INTRAMUSCULAR | Status: AC
  Filled 2014-06-11: qty 1000

## 2014-06-11 MED ORDER — HEPARIN SODIUM (PORCINE) 1000 UNIT/ML IJ SOLN
INTRAMUSCULAR | Status: AC
  Filled 2014-06-11: qty 1

## 2014-06-11 MED ORDER — ASPIRIN 81 MG PO CHEW
324.0000 mg | CHEWABLE_TABLET | Freq: Once | ORAL | Status: AC
  Administered 2014-06-11: 08:00:00 324 mg via ORAL
  Filled 2014-06-11: qty 4

## 2014-06-11 MED ORDER — SODIUM CHLORIDE 0.9 % IV SOLN: 1.0000 mL/kg/h | INTRAVENOUS | Status: DC

## 2014-06-11 MED ORDER — NITROGLYCERIN IN D5W 200-5 MCG/ML-% IV SOLN
INTRAVENOUS | Status: AC
  Filled 2014-06-11: qty 250

## 2014-06-11 MED ORDER — LIDOCAINE HCL (PF) 1 % IJ SOLN
INTRAMUSCULAR | Status: AC
  Filled 2014-06-11: qty 30

## 2014-06-11 MED ORDER — FENTANYL CITRATE 0.05 MG/ML IJ SOLN
INTRAMUSCULAR | Status: AC
  Filled 2014-06-11: qty 2

## 2014-06-11 MED ORDER — MIDAZOLAM HCL 2 MG/2ML IJ SOLN
INTRAMUSCULAR | Status: AC
  Filled 2014-06-11: qty 2

## 2014-06-11 MED ORDER — VERAPAMIL HCL 2.5 MG/ML IV SOLN
INTRAVENOUS | Status: AC
  Filled 2014-06-11: qty 2

## 2014-06-11 MED ORDER — NITROGLYCERIN 1 MG/10 ML FOR IR/CATH LAB
INTRA_ARTERIAL | Status: AC
  Filled 2014-06-11: qty 10

## 2014-06-11 NOTE — Interval H&P Note (Signed)
History and Physical Interval Note:  06/11/2014 8:56 AM  Sandra Bradford  has presented today for surgery, with the diagnosis of cp  The various methods of treatment have been discussed with the patient and family. After consideration of risks, benefits and other options for treatment, the patient has consented to  Procedure(s): LEFT HEART CATHETERIZATION WITH CORONARY/GRAFT ANGIOGRAM (N/A) as a surgical intervention .  The patient's history has been reviewed, patient examined, no change in status, stable for surgery.  I have reviewed the patient's chart and labs.  Questions were answered to the patient's satisfaction.   Cath Lab Visit (complete for each Cath Lab visit)  Clinical Evaluation Leading to the Procedure:   ACS: No.  Non-ACS:    Anginal Classification: CCS II  Anti-ischemic medical therapy: Maximal Therapy (2 or more classes of medications)  Non-Invasive Test Results: Intermediate-risk stress test findings: cardiac mortality 1-3%/year  Prior CABG: Previous CABG        Collier Salina [facility] 06/11/2014 8:57 AM

## 2014-06-11 NOTE — CV Procedure (Signed)
    Cardiac Catheterization Procedure Note  Name: Sandra Bradford MRN: [id number] DOB: 07/11/1940  Procedure: Left Heart Cath, Selective Coronary Angiography, SVG and LIMA angiography  Indication: 74 yo WF with history of AFib s/p DCCV in January. She has DOE and an intermediate risk stress myoview with anteroapical ischemia. EF 55% by prior Echo and 56% by Myoview.   Procedural Details: The left wrist was prepped, draped, and anesthetized with 1% lidocaine. Using the modified Seldinger technique, a 6 French slender sheath was introduced into the left radial artery. 3 mg of verapamil was administered through the sheath, weight-based unfractionated heparin was administered intravenously. Standard Judkins catheters were used for selective coronary angiography and graft angiography. Catheter exchanges were performed over an exchange length guidewire. There were no immediate procedural complications. A TR band was used for radial hemostasis at the completion of the procedure.  The patient was transferred to the post catheterization recovery area for further monitoring. 80 cc of contrast was used.   Procedural Findings: Hemodynamics: AO 127/71 mean 97 mm Hg LV 127/20 mm Hg  Coronary angiography: Coronary dominance: right  Left mainstem: 30% distal left main disease.  Left anterior descending (LAD): The LAD is 99% occluded after 2 small diagonal branches. The third diagonal fills the SVG to the diagonal retrograde. The LAD distally is very small and fills by LIMA.  Left circumflex (LCx): The LCx has a 60% ostial stenosis. There is diffuse 80% disease in the proximal to mid LCx.   Right coronary artery (RCA): The RCA is a dominant vessel. There is diffuse 80% disease in the proximal vessel. The mid vessel has diffuse disease up to 90%. The distal RCA has competitive filling from the SVG.  SVG to the PDA is widely patent and fills the entire distal RCA  SVG to the diagonal is occluded.  SVG to  the OM has sequential 50% stenoses in the proximal vein graft. Otherwise the graft is normal.   LIMA to the LAD is patent but is a small atretic graft supplying a very small and severely diseased distal LAD.  Left ventriculography: Not done  Final Conclusions:   1. Severe 3 vessel obstructive CAD 2. Patent but atretic LIMA to the LAD. The distal LAD is small and severely diseased. 3. Occluded SVG to the diagonal 4. Patent SVG to OM 5. Patent SVG to the PDA  Recommendations: Recommend medical therapy. It is noted that the patient is back in atrial fibrillation. Will hydrate for 8 hours prior to DC today. Resume Xarelto this evening. Arrange follow up BMET in 2 days. Will discuss findings with Dr. Sallyanne Kuster.   [name], [name]  06/11/2014, 9:30 AM

## 2014-06-11 NOTE — Discharge Instructions (Signed)
Have labs drawn Friday.  Radial Site Care Refer to this sheet in the next few weeks. These instructions provide you with information on caring for yourself after your procedure. Your caregiver may also give you more specific instructions. Your treatment has been planned according to current medical practices, but problems sometimes occur. Call your caregiver if you have any problems or questions after your procedure. HOME CARE INSTRUCTIONS  You may shower the day after the procedure.Remove the bandage (dressing) and gently wash the site with plain soap and water.Gently pat the site dry.  Do not apply powder or lotion to the site.  Do not submerge the affected site in water for 3 to 5 days.  Inspect the site at least twice daily.  Do not flex or bend the affected arm for 24 hours.  No lifting over 5 pounds (2.3 kg) for 5 days after your procedure.  Do not drive home if you are discharged the same day of the procedure. Have someone else drive you.  You may drive 24 hours after the procedure unless otherwise instructed by your caregiver.  Do not operate machinery or power tools for 24 hours.  A responsible adult should be with you for the first 24 hours after you arrive home. What to expect:  Any bruising will usually fade within 1 to 2 weeks.  Blood that collects in the tissue (hematoma) may be painful to the touch. It should usually decrease in size and tenderness within 1 to 2 weeks. SEEK IMMEDIATE MEDICAL CARE IF:  You have unusual pain at the radial site.  You have redness, warmth, swelling, or pain at the radial site.  You have drainage (other than a small amount of blood on the dressing).  You have chills.  You have a fever or persistent symptoms for more than 72 hours.  You have a fever and your symptoms suddenly get worse.  Your arm becomes pale, cool, tingly, or numb.  You have heavy bleeding from the site. Hold pressure on the site. Document Released:  05/14/2010 Document Revised: 07/04/2011 Document Reviewed: 05/14/2010 [facility] Patient Information 2015 [place], [place]. This information is not intended to replace advice given to you by your health care provider. Make sure you discuss any questions you have with your health care provider.  Coronary Angiogram A coronary angiogram, also called coronary angiography, is an X-ray procedure used to look at the arteries in the heart. In this procedure, a dye (contrast dye) is injected through a long, hollow tube (catheter). The catheter is about the size of a piece of cooked spaghetti and is inserted through your groin, wrist, or arm. The dye is injected into each artery, and X-rays are then taken to show if there is a blockage in the arteries of your heart. LET [facility] CARE PROVIDER KNOW ABOUT:  Any allergies you have, including allergies to shellfish or contrast dye.   All medicines you are taking, including vitamins, herbs, eye drops, creams, and over-the-counter medicines.   Previous problems you or members of your family have had with the use of anesthetics.   Any blood disorders you have.   Previous surgeries you have had.  History of kidney problems or failure.   Other medical conditions you have. RISKS AND COMPLICATIONS  Generally, a coronary angiogram is a safe procedure. However, problems can occur and include:  Allergic reaction to the dye.  Bleeding from the access site or other locations.  Kidney injury, especially in people with impaired kidney function.  Stroke (rare).  Heart attack (rare). BEFORE THE PROCEDURE   Do not eat or drink anything after midnight the night before the procedure or as directed by your health care provider.   Ask your health care provider about changing or stopping your regular medicines. This is especially important if you are taking diabetes medicines or blood thinners. PROCEDURE  You may be given a medicine to help you relax  (sedative) before the procedure. This medicine is given through an intravenous (IV) access tube that is inserted into one of your veins.   The area where the catheter will be inserted will be washed and shaved. This is usually done in the groin but may be done in the fold of your arm (near your elbow) or in the wrist.   A medicine will be given to numb the area where the catheter will be inserted (local anesthetic).   The health care provider will insert the catheter into an artery. The catheter will be guided by using a special type of X-ray (fluoroscopy) of the blood vessel being examined.   A special dye will then be injected into the catheter, and X-rays will be taken. The dye will help to show where any narrowing or blockages are located in the heart arteries.  AFTER THE PROCEDURE   The insertion site will be checked frequently.   The pulse in your feet or wrist will be checked frequently.   Additional blood tests, X-rays, and an electrocardiogram may be done.  Document Released: 10/16/2002 Document Revised: 08/26/2013 Document Reviewed: 09/03/2012 [facility] Patient Information 2015 [place], [place]. This information is not intended to replace advice given to you by your health care provider. Make sure you discuss any questions you have with your health care provider.

## 2014-06-11 NOTE — Progress Notes (Signed)
Patient Name: ZERINA GUIDER Date of Encounter: 06/11/2014  Principal Problem:   Abnormal nuclear cardiac imaging test Active Problems:   CAD (coronary artery disease)   Hyperlipidemia   Statin intolerance   HTN (hypertension)   Obesity (BMI 30.0-34.9)   Acute renal failure superimposed on stage 3 chronic kidney disease   PAF (paroxysmal atrial fibrillation)   Length of Stay:   SUBJECTIVE  Denies chest pain.   CURRENT MEDS . amiodarone  200 mg Oral BID  . ezetimibe  10 mg Oral Daily  . metoprolol  25 mg Oral BID   OBJECTIVE  Filed Vitals:   06/11/14 0809 06/11/14 0834 06/11/14 1133 06/11/14 1136  BP: 148/58  113/49 113/49  Pulse: 63 80 57 56  Temp: 97.6 F (36.4 C)   97.4 F (36.3 C)  TempSrc: Oral   Oral  Resp: 18   18  Height:      Weight:      SpO2: 96%   96%    Intake/Output Summary (Last 24 hours) at 06/11/14 1415 Last data filed at 06/11/14 1402  Gross per 24 hour  Intake 2017.5 ml  Output   1000 ml  Net 1017.5 ml   Filed Weights   06/10/14 1141 06/11/14 0026  Weight: 187 lb (84.823 kg) 179 lb 10.8 oz (81.5 kg)    PHYSICAL EXAM  General: Pleasant, NAD. Neuro: Alert and oriented X 3. Moves all extremities spontaneously. Psych: Normal affect. HEENT:  Normal  Neck: Supple without bruits or JVD. Lungs:  Resp regular and unlabored, CTA. Heart: RRR no s3, s4, or murmurs. Abdomen: Soft, non-tender, non-distended, BS + x 4.  Extremities: No clubbing, cyanosis or edema. DP/PT/Radials 2+ and equal bilaterally.  Accessory Clinical Findings  CBC No results for input(s): WBC, NEUTROABS, HGB, HCT, MCV, PLT in the last 72 hours. Basic Metabolic Panel  Recent Labs  06/10/14 1230 06/11/14 0351  NA 141 136  K 3.8 4.3  CL 104 105  CO2 26 28  GLUCOSE 95 95  BUN 27* 28*  CREATININE 1.76* 1.63*  CALCIUM 9.4 8.3*   TELE   Left ardiac cath:04/11/2015 Left ventriculography: Not done  Final Conclusions:  1. Severe 3 vessel obstructive CAD 2.  Patent but atretic LIMA to the LAD. The distal LAD is small and severely diseased. 3. Occluded SVG to the diagonal 4. Patent SVG to OM 5. Patent SVG to the PDA  Recommendations: Recommend medical therapy. It is noted that the patient is back in atrial fibrillation. Will hydrate for 8 hours prior to DC today. Resume Xarelto this evening. Arrange follow up BMET in 2 days. Will discuss findings with Dr. Sallyanne Kuster.     ASSESSMENT AND PLAN  74 y.o. female with a history of coronary artery disease status post coronary bypass grafting 5, atrial fibrillation, chronic kidney disease stage III, hypertension, hyperlipidemia, obesity, peripheral artery disease, statin intolerance. She underwent direct current cardioversion on 05/08/2014. She resumed sinus bradycardia with a rate of 45 bpm with some PACs. Her metoprolol was reduced 25 mg twice daily. She underwent a nuclear stress test which was read as intermediate risk with moderate anteroapical ischemia.   1. CAD, abnormal stress test: Underwent cardiac cath today with severe 3 VD and occluded  SVG to diag, atretic LIMA to LAD, patent SVG to OM and PDA. Medical therapy was recommended. - continue asa, zetia (intolerant to statins) and metoprolol - ACEI held due to increased Crea - the patient insisting on going home, we  will hold ACEI and discharge home, arrange for labs follow up on Friday and early follow up i our clinic.   2. Parox afib - back in a-fib, on amiodarone, we will restart xarelto tonight  3. HTN =- controlled  4. HLP - with CAD, we will refer to lipid clinic for possible PCSK 9 inhibitor initiation   Signed, Dorothy Spark MD, [facility] 06/11/2014

## 2014-06-11 NOTE — Progress Notes (Signed)
TR BAND REMOVAL  LOCATION:    left radial  DEFLATED PER PROTOCOL:    Yes.    TIME BAND OFF / DRESSING APPLIED:    1245   SITE UPON ARRIVAL:    Level 0  SITE AFTER BAND REMOVAL:    Level 0, cleansed w/ CHG, redressed w/ 2x2/tegaderm  REVERSE ALLEN'S TEST:     Positive per pleth left thumb  CIRCULATION SENSATION AND MOVEMENT:    Within Normal Limits   Yes.    COMMENTS:   See frequent VS.  Written post radial cath instructions given and reviewed w/ pt and daughter, both voiced understanding.  Pt denies complaints.

## 2014-06-11 NOTE — Progress Notes (Signed)
Up in chair, denies complaints.  Tele a-fib 70's.  Cath lab aware of lab values.  ASA given, NPO otherwise.  To cath lab per bed.

## 2014-06-11 NOTE — Discharge Summary (Signed)
Patient ID: Sandra Bradford,  MRN: [id number], DOB/AGE: [birth year] 74 y.o.  Admit date: 06/10/2014 Discharge date: 06/11/2014  Primary Care Provider: Purvis Kilts, MD Primary Cardiologist: Dr Sallyanne Kuster  Discharge Diagnoses Principal Problem:   Abnormal nuclear cardiac imaging test Active Problems:   CAD-CABG 2002, cath 06/11/14   Acute on chronic renal insufficiency   PAF (paroxysmal atrial fibrillation)   Hyperlipidemia   Statin intolerance   HTN (hypertension)   Obesity (BMI 30.0-34.9)   Chronic anticoagulation    Procedures: Coronary angiogram 06/11/14   Hospital Course:  74 y.o. female with a history of CAD,  S/p CABG 5 2002, chronic kidney disease stage III, hypertension, hyperlipidemia, obesity, peripheral artery disease, and statin intolerance. She underwent direct current cardioversion on 05/08/2014 for PAF. She resumed sinus bradycardia with a rate of 45 bpm with some PACs. Her metoprolol was reduced 25 mg twice daily and she was discharged on Xarelto. She underwent a nuclear stress test during that admission which was read as intermediate risk with moderate anteroapical ischemia. The plan was to do a coronary angiogram one month s/p DCCV. She was admitted for hydration 06/10/14 prior to her cath. On 06/11/14 she underwent coronary angiogram which revealed severe native 3 vessel obstructive CAD with a patent but atretic LIMA to the LAD. The distal LAD is small and severely diseased. She had an occluded SVG to the diagonal, a patent SVG to OM, and a patent SVG to the PDA. The plan is for continued medical Rx. The pt did go back into AF during her cath. Dr Meda Coffee felt the patient could be discharged home late on the 17th. She will resume Xarelto tonight. She will hold her ACE and have a BMP drawn Friday.   Discharge Vitals:  Blood pressure 137/56, pulse 59, temperature 97.4 F (36.3 C), temperature source Oral, resp. rate 18, height 5\' 2"  (1.575 m), weight 179 lb 10.8 oz  (81.5 kg), last menstrual period 04/25/1978, SpO2 98 %.    Labs: Results for orders placed or performed during the hospital encounter of 06/10/14 (from the past 24 hour(s))  Basic metabolic panel     Status: Abnormal   Collection Time: 06/11/14  3:51 AM  Result Value Ref Range   Sodium 136 135 - 145 mmol/L   Potassium 4.3 3.5 - 5.1 mmol/L   Chloride 105 96 - 112 mmol/L   CO2 28 19 - 32 mmol/L   Glucose, Bld 95 70 - 99 mg/dL   BUN 28 (H) 6 - 23 mg/dL   Creatinine, Ser 1.63 (H) 0.50 - 1.10 mg/dL   Calcium 8.3 (L) 8.4 - 10.5 mg/dL   GFR calc non Af Amer 30 (L) >90 mL/min   GFR calc Af Amer 34 (L) >90 mL/min   Anion gap 3 (L) 5 - 15    Disposition:      Follow-up Information    Follow up with Sanda Klein, MD.   Specialty:  Cardiology   Why:  office will call you   Contact information:   [address] [place] [address] [phone]       Discharge Medications:    Medication List    STOP taking these medications        quinapril 10 MG tablet  Commonly known as:  ACCUPRIL      TAKE these medications        acetaminophen 500 MG tablet  Commonly known as:  TYLENOL  Take 1,000 mg by mouth daily  as needed for mild pain.     amiodarone 200 MG tablet  Commonly known as:  PACERONE  Take 200 mg by mouth 2 (two) times daily.     ezetimibe 10 MG tablet  Commonly known as:  ZETIA  Take 1 tablet (10 mg total) by mouth daily.     furosemide 20 MG tablet  Commonly known as:  LASIX  Take 20 mg by mouth daily.     metoprolol 50 MG tablet  Commonly known as:  LOPRESSOR  Take 0.5 tablets (25 mg total) by mouth 2 (two) times daily.     multivitamin with minerals Tabs tablet  Take 1 tablet by mouth daily.     Rivaroxaban 15 MG Tabs tablet  Commonly known as:  XARELTO  Take 1 tablet (15 mg total) by mouth daily with supper.     Vitamin D 2000 UNITS Caps  Take 2,000 Units by mouth daily.         Duration of Discharge Encounter: Greater than 30  minutes including physician time.  Angelena Form PA-C 06/11/2014 3:23 PM

## 2014-06-11 NOTE — Progress Notes (Signed)
Returned from cath lab, denies complaints.  Left radial TR band intact, level 0, fingers warm and mobile.  Instructed pt to call for any sign of bleeding or pain.  Lunch ordered, snacks given.

## 2014-06-16 ENCOUNTER — Ambulatory Visit: Payer: Medicare Other | Admitting: Cardiovascular Disease

## 2014-06-16 ENCOUNTER — Telehealth: Payer: Self-pay | Admitting: Cardiovascular Disease

## 2014-06-16 DIAGNOSIS — Z79899 Other long term (current) drug therapy: Secondary | ICD-10-CM

## 2014-06-16 NOTE — Telephone Encounter (Signed)
Advised pt I would order BMET as indicated in hospital discharge notes & leave slip at front desk.

## 2014-06-16 NOTE — Telephone Encounter (Signed)
Pt says she needs a lab order. She is going to have it downstairs.says she can pick up the lab order from here.She wants to have it this morning.Please call and let her know if this is alright.

## 2014-06-16 NOTE — Telephone Encounter (Signed)
Closed encounter °

## 2014-06-17 LAB — BASIC METABOLIC PANEL
BUN: 29 mg/dL — ABNORMAL HIGH (ref 6–23)
CO2: 28 meq/L (ref 19–32)
Calcium: 10.2 mg/dL (ref 8.4–10.5)
Chloride: 97 mEq/L (ref 96–112)
Creat: 1.62 mg/dL — ABNORMAL HIGH (ref 0.50–1.10)
Glucose, Bld: 85 mg/dL (ref 70–99)
POTASSIUM: 4.3 meq/L (ref 3.5–5.3)
Sodium: 140 mEq/L (ref 135–145)

## 2014-06-18 ENCOUNTER — Telehealth: Payer: Self-pay | Admitting: *Deleted

## 2014-06-18 NOTE — Telephone Encounter (Signed)
Spoke to patient. Result given . Verbalized understanding KEEP APPOINTMENT

## 2014-06-18 NOTE — Telephone Encounter (Signed)
-----   Message from Sanda Klein, MD sent at 06/17/2014  8:11 AM EST ----- Labs okay. Kidney function is abnormal, but stable over last couple of weeks

## 2014-06-23 ENCOUNTER — Encounter: Payer: Self-pay | Admitting: Cardiovascular Disease

## 2014-06-23 ENCOUNTER — Ambulatory Visit (INDEPENDENT_AMBULATORY_CARE_PROVIDER_SITE_OTHER): Payer: Medicare Other | Admitting: Cardiovascular Disease

## 2014-06-23 VITALS — BP 122/66 | HR 73 | Ht 62.5 in | Wt 185.7 lb

## 2014-06-23 DIAGNOSIS — I480 Paroxysmal atrial fibrillation: Secondary | ICD-10-CM

## 2014-06-23 DIAGNOSIS — I10 Essential (primary) hypertension: Secondary | ICD-10-CM

## 2014-06-23 DIAGNOSIS — I25810 Atherosclerosis of coronary artery bypass graft(s) without angina pectoris: Secondary | ICD-10-CM

## 2014-06-23 DIAGNOSIS — I4819 Other persistent atrial fibrillation: Secondary | ICD-10-CM

## 2014-06-23 DIAGNOSIS — I481 Persistent atrial fibrillation: Secondary | ICD-10-CM

## 2014-06-23 DIAGNOSIS — Z7901 Long term (current) use of anticoagulants: Secondary | ICD-10-CM

## 2014-06-23 MED ORDER — METOPROLOL TARTRATE 50 MG PO TABS: 50.0000 mg | ORAL_TABLET | Freq: Two times a day (BID) | ORAL | Status: DC

## 2014-06-23 NOTE — Progress Notes (Signed)
Patient ID: Sandra Bradford, female   DOB: May 01, 1940, 74 y.o.   MRN: [id number]     Reason for office visit Follow-up after cardiac catheterization and cardioversion  Sandra Bradford has a long-standing history of heart disease. Multivessel bypass surgery about 13 years ago. She had elective electrical cardioversion for persistent atrial fibrillation in early January. 5 days later she was still in sinus bradycardia, while on amiodarone therapy. She subsequently presented for elective cardiac catheterization for an abnormal stress test showing anteroapical ischemia on February 17 and was back in atrial fibrillation. Cardiac catheterization showed that the ischemic abnormality was related to diffuse disease in the distal LAD artery, not amenable to percutaneous revascularization:  1. Severe 3 vessel obstructive CAD 2. Patent but atretic LIMA to the LAD. The distal LAD is small and severely diseased. 3. Occluded SVG to the diagonal 4. Patent SVG to OM 5. Patent SVG to the PDA  Sandra Bradford really didn't seem to notice any change in stamina or breathing when she changed from sinus bradycardia to atrial fibrillation. She is well rate controlled. She is on appropriate anticoagulation therapy. She has not had any neurological events or bleeding problems.  Allergies  Allergen Reactions  . Codeine Nausea Only  . Statins     MYALGIA CONFUSION    Current Outpatient Prescriptions  Medication Sig Dispense Refill  . acetaminophen (TYLENOL) 500 MG tablet Take 1,000 mg by mouth daily as needed for mild pain.     . Cholecalciferol (VITAMIN D) 2000 UNITS CAPS Take 2,000 Units by mouth daily.    Marland Kitchen ezetimibe (ZETIA) 10 MG tablet Take 1 tablet (10 mg total) by mouth daily. 63 tablet 0  . furosemide (LASIX) 20 MG tablet Take 20 mg by mouth daily.     . metoprolol (LOPRESSOR) 50 MG tablet Take 1 tablet (50 mg total) by mouth 2 (two) times daily. 180 tablet 3  . Multiple Vitamin (MULITIVITAMIN WITH MINERALS) TABS Take 1  tablet by mouth daily.    . Rivaroxaban (XARELTO) 15 MG TABS tablet Take 1 tablet (15 mg total) by mouth daily with supper. 90 tablet 3   No current facility-administered medications for this visit.    Past Medical History  Diagnosis Date  . Coronary artery disease   . Hyperlipemia   . Systemic hypertension   . Carotid arterial disease   . PAD (peripheral artery disease)   . Diverticulitis   . A-fib   . Abnormal nuclear cardiac imaging test 05/01/2014  . PONV (postoperative nausea and vomiting)   . Heart murmur   . DVT (deep venous thrombosis) 2001    "related to sitting around when I broke my foot"  . Pneumonia ~ 2010 X 2  . Chronic bronchitis     "several times; haven't had it since I quit working in 2008" (06/10/2014)  . History of blood transfusion 01/2001    "related to [facility]"   . Arthritis     "scattered" (06/10/2014)  . Chronic kidney disease, stage 3   . 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 Tonsillectomy  1953  . Tubal ligation  ~ 1973  . Vaginal hysterectomy  1980  . Knee arthroscopy Right ~ 1985  . Cholecystectomy N/A 01/17/2013    Procedure: LAPAROSCOPIC CHOLECYSTECTOMY;  Surgeon: Donato Heinz, MD;  Location: [facility]S;  Service: General;  Laterality: N/A;  . Nm myocar perf wall motion  08/18/2008  No significant ischemia  . US echocardiography  07/04/2008    trace MR,mild TR  . Colonoscopy  July 2007    Dr. Oneida Alar: ascending and sigmoid colon diverticula  . Colonoscopy  Dec 2011    Dr. Oneida Alar: ischemic colitis. CTA performed with patent celiac, SMA, and IMA  . Colonoscopy N/A 08/06/2013    Procedure: COLONOSCOPY;  Surgeon: Daneil Dolin, MD;  Location: [facility] SUITE;  Service: Endoscopy;  Laterality: N/A;  8:30  . Cardioversion N/A 05/08/2014    Procedure: CARDIOVERSION;  Surgeon: Sanda Klein, MD;  Location: [facility] ENDOSCOPY;  Service: Cardiovascular;  Laterality: N/A;  . Cardiac catheterization   2002; 2005  . Mohs surgery  ~ 04/2013    upper lip  . Coronary artery bypass graft  01/2001    LIMA TO LAD,SVG TO DIAGONAL,SVG TO MARGINAL,SVG SEQUENTALLY TO THE PD & PL VESSEL  . Left heart catheterization with coronary/graft angiogram N/A 06/11/2014    Procedure: LEFT HEART CATHETERIZATION WITH Beatrix Fetters;  Surgeon: Peter M Martinique, MD;  Location: [facility] CATH LAB;  Service: Cardiovascular;  Laterality: N/A;    Family History  Problem Relation Age of Onset  . Heart attack Mother   . Heart attack Father   . Congestive Heart Failure Father   . Colon cancer Neg Hx     History   Social History  . Marital Status: Divorced    Spouse Name: N/A  . Number of Children: N/A  . Years of Education: N/A   Occupational History  . Not on file.   Social History Main Topics  . Smoking status: Never Smoker   . Smokeless tobacco: Never Used  . Alcohol Use: Yes     Comment: 06/10/2014 "might have a beer after mowing yard or a drink at holidays"  . Drug Use: No  . Sexual Activity: No   Other Topics Concern  . Not on file   Social History Narrative    Review of systems: The patient specifically denies any chest pain at rest or with exertion, dyspnea at rest or with exertion, orthopnea, paroxysmal nocturnal dyspnea, syncope, palpitations, focal neurological deficits, intermittent claudication, lower extremity edema, unexplained weight gain, cough, hemoptysis or wheezing.  The patient also denies abdominal pain, nausea, vomiting, dysphagia, diarrhea, constipation, polyuria, polydipsia, dysuria, hematuria, frequency, urgency, abnormal bleeding or bruising, fever, chills, unexpected weight changes, mood swings, change in skin or hair texture, change in voice quality, auditory or visual problems, allergic reactions or rashes, new musculoskeletal complaints other than usual "aches and pains".   PHYSICAL EXAM BP 122/66 mmHg  Pulse 73  Ht 5' 2.5" (1.588 m)  Wt 185 lb 11.2 oz (84.233 kg)   BMI 33.40 kg/m2  LMP 04/25/1978 General: Alert, oriented x3, no distress Head: no evidence of trauma, PERRL, EOMI, no exophtalmos or lid lag, no myxedema, no xanthelasma; normal ears, nose and oropharynx Neck: normal jugular venous pulsations and no hepatojugular reflux; brisk carotid pulses without delay and no carotid bruits Chest: clear to auscultation, no signs of consolidation by percussion or palpation, normal fremitus, symmetrical and full respiratory excursions Cardiovascular: normal position and quality of the apical impulse, irregular rhythm, normal first and second heart sounds, no rubs or gallops, Grade 1/6 early peak in systolic ejection murmur. Abdomen: no tenderness or distention, no masses by palpation, no abnormal pulsatility or arterial bruits, normal bowel sounds, no hepatosplenomegaly Extremities: no clubbing, cyanosis or edema; 2+ radial, ulnar and brachial pulses bilaterally; 2+ right femoral, posterior tibial and dorsalis pedis pulses;  2+ left femoral, posterior tibial and dorsalis pedis pulses; no subclavian or femoral bruits Neurological: grossly nonfocal  EKG: Atrial fibrillation, nonspecific ST changes  Lipid Panel     Component Value Date/Time   CHOL 236* 11/06/2012 0845   TRIG 152* 11/06/2012 0845   HDL 50 11/06/2012 0845   CHOLHDL 4.7 11/06/2012 0845   VLDL 30 11/06/2012 0845   LDLCALC 156* 11/06/2012 0845    BMET    Component Value Date/Time   NA 140 06/16/2014 1039   K 4.3 06/16/2014 1039   CL 97 06/16/2014 1039   CO2 28 06/16/2014 1039   GLUCOSE 85 06/16/2014 1039   BUN 29* 06/16/2014 1039   CREATININE 1.62* 06/16/2014 1039   CREATININE 1.63* 06/11/2014 0351   CALCIUM 10.2 06/16/2014 1039   GFRNONAA 30* 06/11/2014 0351   GFRAA 34* 06/11/2014 0351     ASSESSMENT AND PLAN CAD (coronary artery disease) Currently no symptoms of angina pectoris, but she has substantial problems with dyspnea. Nuclear stress test suggests stenosis in the LAD/LIMA  distribution. 13 years have passed since her bypass surgery and graft failure is a distinct possibility. Coronary angiography 30 days after cardioversion to avoid interruption of anticoagulants and reduce risk of stroke.  Atrial fibrillation The cardioversion was temporarily successful, but there was no change in symptomatology. I do not think that continued antiarrhythmic therapy is justified.Will return to a rate control strategy. Will stop amiodarone and increase metoprolol to 25 mg 2 times a day. Suspect we'll have to increase the dose of metoprolol further as the amiodarone "washes out".  Statin intolerance She might be a very good candidate for Praluent or Repatha. We'll address this after we've clarified the course of treatment for her arrhythmia. To get updated lipid profile.  Hyperlipidemia She has mixed hyperlipidemia with both substantial elevation in LDL cholesterol and very mild hypertriglyceridemia. The latter is probably related to some degree of insulin resistance in the setting of obesity and borderline elevation in glucose. She will be a good candidate for treatment with Repatha or Praluent if we can obtain insurance support  Systolic murmur By recent echo there is only aortic valve sclerosis without significant stenosis  Obesity (BMI 30.0-34.9) Weight loss is recommended to help with borderline diabetes and hypertriglyceridemia.  Chronic kidney disease, stage 3   Orders Placed This Encounter  Procedures  . EKG 12-Lead   Meds ordered this encounter  Medications  . metoprolol (LOPRESSOR) 50 MG tablet    Sig: Take 1 tablet (50 mg total) by mouth 2 (two) times daily.    Dispense:  180 tablet    Refill:  [place] Sharissa Brierley, MD, [facility] [phone] office [phone] pager

## 2014-06-23 NOTE — Patient Instructions (Signed)
STOP the Amiodarone.  INCREASE Metoprolol to 50mg  twice a day.  Dr. Sallyanne Kuster recommends that you schedule a follow-up appointment in: 6 weeks.

## 2014-06-24 ENCOUNTER — Telehealth: Payer: Self-pay | Admitting: *Deleted

## 2014-06-24 DIAGNOSIS — E785 Hyperlipidemia, unspecified: Secondary | ICD-10-CM

## 2014-06-24 NOTE — Telephone Encounter (Signed)
Patient notified to get fasting lipid profile done at the [facility] lab before her next appt.  Patient voiced understanding.

## 2014-06-24 NOTE — Telephone Encounter (Signed)
-----   Message from Sanda Klein, MD sent at 06/23/2014  5:01 PM EST ----- Need to get a fasting lipid profile between now and follow-up appointment

## 2014-07-17 ENCOUNTER — Encounter: Payer: Self-pay | Admitting: Internal Medicine

## 2014-08-05 LAB — LIPID PANEL
Cholesterol: 226 mg/dL — ABNORMAL HIGH (ref 0–200)
HDL: 51 mg/dL (ref >=46)
LDL Cholesterol: 156 mg/dL — ABNORMAL HIGH (ref 0–99)
Total CHOL/HDL Ratio: 4.4 Ratio
Triglycerides: 96 mg/dL (ref <150)
VLDL: 19 mg/dL (ref 0–40)

## 2014-08-07 ENCOUNTER — Encounter: Payer: Self-pay | Admitting: Cardiovascular Disease

## 2014-08-07 ENCOUNTER — Ambulatory Visit (INDEPENDENT_AMBULATORY_CARE_PROVIDER_SITE_OTHER): Payer: Medicare Other | Admitting: Cardiovascular Disease

## 2014-08-07 VITALS — BP 124/62 | HR 88 | Ht 62.25 in | Wt 191.1 lb

## 2014-08-07 DIAGNOSIS — I481 Persistent atrial fibrillation: Secondary | ICD-10-CM | POA: Diagnosis not present

## 2014-08-07 DIAGNOSIS — I4819 Other persistent atrial fibrillation: Secondary | ICD-10-CM

## 2014-08-07 DIAGNOSIS — E785 Hyperlipidemia, unspecified: Secondary | ICD-10-CM

## 2014-08-07 DIAGNOSIS — I25810 Atherosclerosis of coronary artery bypass graft(s) without angina pectoris: Secondary | ICD-10-CM | POA: Diagnosis not present

## 2014-08-07 DIAGNOSIS — I10 Essential (primary) hypertension: Secondary | ICD-10-CM | POA: Diagnosis not present

## 2014-08-07 DIAGNOSIS — Z889 Allergy status to unspecified drugs, medicaments and biological substances status: Secondary | ICD-10-CM

## 2014-08-07 DIAGNOSIS — I739 Peripheral vascular disease, unspecified: Secondary | ICD-10-CM

## 2014-08-07 DIAGNOSIS — Z7901 Long term (current) use of anticoagulants: Secondary | ICD-10-CM

## 2014-08-07 DIAGNOSIS — Z789 Other specified health status: Secondary | ICD-10-CM

## 2014-08-07 NOTE — Patient Instructions (Signed)
Dr Croitoru recommends that you schedule a follow-up appointment in 6 months. You will receive a reminder letter in the mail two months in advance. If you don't receive a letter, please call our office to schedule the follow-up appointment. 

## 2014-08-07 NOTE — Progress Notes (Signed)
Patient Sandra Bradford: Sandra Bradford, female   DOB: Jul 02, 1940, 74 y.o.   MRN: [id number]     Cardiology Office Note   Date:  08/09/2014   Sandra Bradford:  Sandra Bradford, DOB 02/13/1941, MRN [id number]  PCP:  Sandra Kilts, MD  Cardiologist:   Sanda Klein, MD   Chief Complaint  Patient presents with  . Follow-up    patient reports episodes of a-fib.  . Medication Refill    patient has received quinapril 10 mg from mail order pharmacy. [place] increased medication in Jan, patient had cath in Feb and quinapril was d/c'd upon discharge.      History of Present Illness: Sandra Bradford is a 74 y.o. female who presents for follow-up for coronary artery disease and persistent, recurrent atrial fibrillation. After recurrence of atrial fibrillation while on amiodarone therapy, we converted to a "rate control" strategy. Her recent coronary angiogram showed diffuse disease in the distal LAD artery, downstream of the LIMA anastomosis and not really amenable to revascularization.  She seems to be tolerating the atrial fibrillation well. She does think that she has "more A. fib than before" but she thinks that she is in A. fib when she hears a clicking sound in her ear. Otherwise she is unaware of palpitations or any change in exercise tolerance.  She has a long history of intolerance to statins, multiple of which have been tried over the years. Her LDL cholesterol is very high, especially for a patient with long-standing vascular disease.  Past Medical History  Diagnosis Date  . Coronary artery disease   . Hyperlipemia   . Systemic hypertension   . Carotid arterial disease   . PAD (peripheral artery disease)   . Diverticulitis   . A-fib   . Abnormal nuclear cardiac imaging test 05/01/2014  . PONV (postoperative nausea and vomiting)   . Heart murmur   . DVT (deep venous thrombosis) 2001    "related to sitting around when I broke my foot"  . Pneumonia ~ 2010 X 2  . Chronic bronchitis     "several times; haven't had it  since I quit working in 2008" (06/10/2014)  . History of blood transfusion 01/2001    "related to OHS"   . Arthritis     "scattered" (06/10/2014)  . Chronic kidney disease, stage 3   . 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 Tonsillectomy  1953  . Tubal ligation  ~ 1973  . Vaginal hysterectomy  1980  . Knee arthroscopy Right ~ 1985  . Cholecystectomy N/A 01/17/2013    Procedure: LAPAROSCOPIC CHOLECYSTECTOMY;  Surgeon: Donato Heinz, MD;  Location: [facility];  Service: General;  Laterality: N/A;  . Nm myocar perf wall motion  08/18/2008    No significant ischemia  . US echocardiography  07/04/2008    trace MR,mild TR  . Colonoscopy  July 2007    Dr. Oneida Alar: ascending and sigmoid colon diverticula  . Colonoscopy  Dec 2011    Dr. Oneida Alar: ischemic colitis. CTA performed with patent celiac, SMA, and IMA  . Colonoscopy N/A 08/06/2013    Procedure: COLONOSCOPY;  Surgeon: Daneil Dolin, MD;  Location: [facility] SU[facility];  Service: Endoscopy;  Laterality: N/A;  8:30  . Cardioversion N/A 05/08/2014    Procedure: CARDIOVERSION;  Surgeon: Sanda Klein, MD;  Location: [facility] ENDOSCOPY;  Service: Cardiovascular;  Laterality: N/A;  . Cardiac catheterization  2002; 2005  .  Mohs surgery  ~ 04/2013    upper lip  . Coronary artery bypass graft  01/2001    LIMA TO LAD,SVG TO DIAGONAL,SVG TO MARGINAL,SVG SEQUENTALLY TO THE PD & PL VESSEL  . Left heart catheterization with coronary/graft angiogram N/A 06/11/2014    Procedure: LEFT HEART CATHETERIZATION WITH Beatrix Fetters;  Surgeon: Peter M Martinique, MD;  Location: [facility] CATH LAB;  Service: Cardiovascular;  Laterality: N/A;     Current Outpatient Prescriptions  Medication Sig Dispense Refill  . acetaminophen (TYLENOL) 500 MG tablet Take 1,000 mg by mouth daily as needed for mild pain.     . Cholecalciferol (VITAMIN D) 2000 UNITS CAPS Take 2,000 Units by mouth daily.    Marland Kitchen ezetimibe (ZETIA) 10  MG tablet Take 1 tablet (10 mg total) by mouth daily. 63 tablet 0  . furosemide (LASIX) 20 MG tablet Take 20 mg by mouth daily.     . metoprolol (LOPRESSOR) 50 MG tablet Take 1 tablet (50 mg total) by mouth 2 (two) times daily. 180 tablet 3  . Multiple Vitamin (MULITIVITAMIN WITH MINERALS) TABS Take 1 tablet by mouth daily.    . quinapril (ACCUPRIL) 5 MG tablet Take 5 mg by mouth daily.    . Rivaroxaban (XARELTO) 15 MG TABS tablet Take 1 tablet (15 mg total) by mouth daily with supper. 90 tablet 3   No current facility-administered medications for this visit.    Allergies:   Codeine and Statins    Social History:  The patient  reports that she has never smoked. She has never used smokeless tobacco. She reports that she drinks alcohol. She reports that she does not use illicit drugs.   Family History:  The patient's family history includes Congestive Heart Failure in her father; Heart attack in her father and mother. There is no history of Colon cancer.    ROS:  Please see the history of present illness.    The patient specifically denies any chest pain at rest or with exertion, dyspnea at rest or with exertion, orthopnea, paroxysmal nocturnal dyspnea, syncope, palpitations, focal neurological deficits, intermittent claudication, lower extremity edema, unexplained weight gain, cough, hemoptysis or wheezing.  The patient also denies abdominal pain, nausea, vomiting, dysphagia, diarrhea, constipation, polyuria, polydipsia, dysuria, hematuria, frequency, urgency, abnormal bleeding or bruising, fever, chills, unexpected weight changes, mood swings, change in skin or hair texture, change in voice quality, auditory or visual problems, allergic reactions or rashes, new musculoskeletal complaints other than usual "aches and pains".   All other systems are reviewed and negative.    PHYSICAL EXAM: VS:  BP 124/62 mmHg  Pulse 88  Ht 5' 2.25" (1.581 m)  Wt 191 lb 1.6 oz (86.682 kg)  BMI 34.68 kg/m2   LMP 04/25/1978 , BMI Body mass index is 34.68 kg/(m^2).  General: Alert, oriented x3, no distress Head: no evidence of trauma, PERRL, EOMI, no exophtalmos or lid lag, no myxedema, no xanthelasma; normal ears, nose and oropharynx Neck: normal jugular venous pulsations and no hepatojugular reflux; brisk carotid pulses without delay and no carotid bruits Chest: clear to auscultation, no signs of consolidation by percussion or palpation, normal fremitus, symmetrical and full respiratory excursions Cardiovascular: normal position and quality of the apical impulse, irregular rhythm, normal first and second heart sounds, grade 1/6 early peaking systolic ejection murmur, rubs or gallops Abdomen: no tenderness or distention, no masses by palpation, no abnormal pulsatility or arterial bruits, normal bowel sounds, no hepatosplenomegaly Extremities: no clubbing, cyanosis or edema; 2+ radial, ulnar and  brachial pulses bilaterally; 2+ right femoral, posterior tibial and dorsalis pedis pulses; 2+ left femoral, posterior tibial and dorsalis pedis pulses; no subclavian or femoral bruits Neurological: grossly nonfocal Psych: euthymic mood, full affect   EKG:  EKG is ordered today. The ekg ordered today demonstrates atrial fibrillation    Recent Labs: 05/05/2014: ALT 17 06/04/2014: Hemoglobin 12.8; Platelets 262; TSH 0.544 06/16/2014: BUN 29*; Creatinine 1.62*; Potassium 4.3; Sodium 140    Lipid Panel    Component Value Date/Time   CHOL 226* 08/04/2014 0933   TRIG 96 08/04/2014 0933   HDL 51 08/04/2014 0933   CHOLHDL 4.4 08/04/2014 0933   VLDL 19 08/04/2014 0933   LDLCALC 156* 08/04/2014 0933      Wt Readings from Last 3 Encounters:  08/07/14 191 lb 1.6 oz (86.682 kg)  06/23/14 185 lb 11.2 oz (84.233 kg)  06/11/14 179 lb 10.8 oz (81.5 kg)      ASSESSMENT AND PLAN:  CAD (coronary artery disease) Cath 2016: 1. Severe 3 vessel obstructive CAD 2. Patent but atretic LIMA to the LAD. The distal  LAD is small and severely diseased. 3. Occluded SVG to the diagonal 4. Patent SVG to OM 5. Patent SVG to the PDA  Atrial fibrillation The cardioversion was temporarily successful, but there was no change in symptomatology. I do not think that continued antiarrhythmic therapy is justified.Will continue rate control strategy. Current metoprolol dose appears to be adequate.  Statin intolerance She is probably a very good candidate for Praluent or Repatha. Will explore options for insurance coverage. She has substantial elevation in LDL cholesterol. Target LDL ,70 - requires a >60% reduction.  Systolic murmur By recent echo there is only aortic valve sclerosis without significant stenosis  Obesity (BMI 30.0-34.9) Weight loss is recommended to help with borderline diabetes and vascular risk.  Chronic kidney disease, stage 3  Current medicines are reviewed at length with the patient today.  The patient has concerns regarding cost of new lipid lowering medicines.  The following changes have been made:  none  Patient Instructions  Dr Sallyanne Kuster recommends that you schedule a follow-up appointment in 6 months. You will receive a reminder letter in the mail two months in advance. If you don't receive a letter, please call our office to schedule the follow-up appointment.     Mikael Spray, MD  08/09/2014 8:04 PM    Sanda Klein, MD, [facility] [phone] office [phone] pager

## 2014-12-15 ENCOUNTER — Telehealth: Payer: Self-pay | Admitting: Cardiovascular Disease

## 2014-12-15 NOTE — Telephone Encounter (Signed)
Received records from [facility] for appointment on 02/09/15 with Dr Sallyanne Kuster.  Records given to [facility] (medical records) for Dr Croitoru's schedule on 02/09/15. lp

## 2015-02-02 ENCOUNTER — Other Ambulatory Visit (HOSPITAL_COMMUNITY): Payer: Self-pay | Admitting: Internal Medicine

## 2015-02-02 DIAGNOSIS — E0590 Thyrotoxicosis, unspecified without thyrotoxic crisis or storm: Secondary | ICD-10-CM

## 2015-02-05 ENCOUNTER — Other Ambulatory Visit (HOSPITAL_COMMUNITY): Payer: Medicare Other

## 2015-02-09 ENCOUNTER — Encounter: Payer: Self-pay | Admitting: Cardiovascular Disease

## 2015-02-09 ENCOUNTER — Ambulatory Visit (INDEPENDENT_AMBULATORY_CARE_PROVIDER_SITE_OTHER): Payer: Medicare Other | Admitting: Cardiovascular Disease

## 2015-02-09 VITALS — BP 140/64 | HR 82 | Ht 62.0 in | Wt 181.5 lb

## 2015-02-09 DIAGNOSIS — I25810 Atherosclerosis of coronary artery bypass graft(s) without angina pectoris: Secondary | ICD-10-CM

## 2015-02-09 DIAGNOSIS — I739 Peripheral vascular disease, unspecified: Secondary | ICD-10-CM | POA: Diagnosis not present

## 2015-02-09 DIAGNOSIS — I872 Venous insufficiency (chronic) (peripheral): Secondary | ICD-10-CM

## 2015-02-09 DIAGNOSIS — I481 Persistent atrial fibrillation: Secondary | ICD-10-CM | POA: Diagnosis not present

## 2015-02-09 DIAGNOSIS — I4819 Other persistent atrial fibrillation: Secondary | ICD-10-CM

## 2015-02-09 NOTE — Progress Notes (Signed)
Patient ID: Sandra Bradford, female   DOB: 13-May-1940, 74 y.o.   MRN: [id number]     Cardiology Office Note   Date:  02/09/2015   ID:  Sandra Bradford, DOB 06-Jan-1941, MRN [id number]  PCP:  Purvis Kilts, MD  Cardiologist:   Sanda Klein, MD   Chief Complaint  Patient presents with  . 6 Months f/u  . Atrial Fibrillation  . PAD      History of Present Illness: Sandra Bradford is a 74 y.o. female who presents for  Follow-up for atrial fibrillation. After early recurrence of atrial fibrillation while on antiarrhythmic therapy, we switched to a rate control strategy. She is now in atrial fibrillation with well-controlled rate and is unaware of the arrhythmia. She is undergoing a scan for abnormal thyroid function tests (am not sure of the details). She has no cardiac or vascular complaints. Has not had any bleeding problems on Xarelto. She was last seen in the nephrology clinic a couple of months ago by Dr. Moshe Cipro. Her furosemide was increased to 20 mg daily. She believes her lower extremity edema is less than in the past.  Creatinine is around baseline of 1.2.    Past Medical History  Diagnosis Date  . Coronary artery disease   . Hyperlipemia   . Systemic hypertension   . Carotid arterial disease ([place])   . PAD (peripheral artery disease) ([place])   . Diverticulitis   . A-fib ([place])   . Abnormal nuclear cardiac imaging test 05/01/2014  . PONV (postoperative nausea and vomiting)   . Heart murmur   . DVT (deep venous thrombosis) ([place]) 2001    "related to sitting around when I broke my foot"  . Pneumonia ~ 2010 X 2  . Chronic bronchitis ([place])     "several times; haven't had it since I quit working in 2008" (06/10/2014)  . History of blood transfusion 01/2001    "related to OHS"   . Arthritis     "scattered" (06/10/2014)  . Chronic kidney disease, stage 3   . 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 Tonsillectomy   1953  . Tubal ligation  ~ 1973  . Vaginal hysterectomy  1980  . Knee arthroscopy Right ~ 1985  . Cholecystectomy N/A 01/17/2013    Procedure: LAPAROSCOPIC CHOLECYSTECTOMY;  Surgeon: Donato Heinz, MD;  Location: [facility] ORS;  Service: General;  Laterality: N/A;  . Nm myocar perf wall motion  08/18/2008    No significant ischemia  . US echocardiography  07/04/2008    trace MR,mild TR  . Colonoscopy  July 2007    Dr. Oneida Alar: ascending and sigmoid colon diverticula  . Colonoscopy  Dec 2011    Dr. Oneida Alar: ischemic colitis. CTA performed with patent celiac, SMA, and IMA  . Colonoscopy N/A 08/06/2013    Procedure: COLONOSCOPY;  Surgeon: Daneil Dolin, MD;  Location: [facility] END[facility] SU[facility];  Service: Endoscopy;  Laterality: N/A;  8:30  . Cardioversion N/A 05/08/2014    Procedure: CARDIOVERSION;  Surgeon: Sanda Klein, MD;  Location: [facility] ENDOSCOPY;  Service: Cardiovascular;  Laterality: N/A;  . Cardiac catheterization  2002; 2005  . Mohs surgery  ~ 04/2013    upper lip  . Coronary artery bypass graft  01/2001    LIMA TO LAD,SVG TO DIAGONAL,SVG TO MARGINAL,SVG SEQUENTALLY TO THE PD & PL VESSEL  . Left heart catheterization with coronary/graft angiogram N/A 06/11/2014  Procedure: LEFT HEART CATHETERIZATION WITH Beatrix Fetters;  Surgeon: Peter M Martinique, MD;  Location: [facility] CATH LAB;  Service: Cardiovascular;  Laterality: N/A;     Current Outpatient Prescriptions  Medication Sig Dispense Refill  . acetaminophen (TYLENOL) 500 MG tablet Take 1,000 mg by mouth daily as needed for mild pain.     . Biotin (BIOTIN MAXIMUM STRENGTH) 10 MG TABS Take 1 tablet by mouth daily.    . Cholecalciferol (VITAMIN D) 2000 UNITS CAPS Take 2,000 Units by mouth daily.    . furosemide (LASIX) 20 MG tablet Take 20 mg by mouth daily.     . metoprolol (LOPRESSOR) 50 MG tablet Take 1/2 tablet (25 mg total) by mouth 2 (two) times daily. 180 tablet 3  . Multiple Vitamin (MULITIVITAMIN WITH MINERALS) TABS Take 1 tablet by mouth  daily.    . quinapril (ACCUPRIL) 5 MG tablet Take 5 mg by mouth daily.    . Rivaroxaban (XARELTO) 15 MG TABS tablet Take 1 tablet (15 mg total) by mouth daily with supper. 90 tablet 3   No current facility-administered medications for this visit.    Allergies:   Codeine and Statins    Social History:  The patient  reports that she has never smoked. She has never used smokeless tobacco. She reports that she drinks alcohol. She reports that she does not use illicit drugs.   Family History:  The patient's family history includes Congestive Heart Failure in her father; Heart attack in her father and mother. There is no history of Colon cancer.    ROS:  Please see the history of present illness.    Otherwise, review of systems positive for none.   All other systems are reviewed and negative.    PHYSICAL EXAM: VS:  BP 140/64 mmHg  Pulse 82  Ht 5\' 2"  (1.575 m)  Wt 181 lb 8 oz (82.328 kg)  BMI 33.19 kg/m2  LMP 04/25/1978 , BMI Body mass index is 33.19 kg/(m^2).  General: Alert, oriented x3, no distress Head: no evidence of trauma, PERRL, EOMI, no exophtalmos or lid lag, no myxedema, no xanthelasma; normal ears, nose and oropharynx Neck: normal jugular venous pulsations and no hepatojugular reflux; brisk carotid pulses without delay and no carotid bruits Chest: clear to auscultation, no signs of consolidation by percussion or palpation, normal fremitus, symmetrical and full respiratory excursions Cardiovascular: normal position and quality of the apical impulse, irregular rhythm, normal first and second heart sounds, no  murmurs, rubs or gallops Abdomen: no tenderness or distention, no masses by palpation, no abnormal pulsatility or arterial bruits, normal bowel sounds, no hepatosplenomegaly Extremities: no clubbing, cyanosis or edema; 2+ radial, ulnar and brachial pulses bilaterally; 2+ right femoral, posterior tibial and dorsalis pedis pulses; 2+ left femoral, posterior tibial and dorsalis  pedis pulses; no subclavian or femoral bruits Neurological: grossly nonfocal Psych: euthymic mood, full affect   EKG:  EKG is ordered today. The ekg ordered today demonstrates atrial fibrillation QTc 469 ms   Recent Labs: 05/05/2014: ALT 17 06/04/2014: Hemoglobin 12.8; Platelets 262; TSH 0.544 06/16/2014: BUN 29*; Creat 1.62*; Potassium 4.3; Sodium 140    Lipid Panel    Component Value Date/Time   CHOL 226* 08/04/2014 0933   TRIG 96 08/04/2014 0933   HDL 51 08/04/2014 0933   CHOLHDL 4.4 08/04/2014 0933   VLDL 19 08/04/2014 0933   LDLCALC 156* 08/04/2014 0933      Wt Readings from Last 3 Encounters:  02/09/15 181 lb 8 oz (82.328 kg)  08/07/14 191 lb 1.6 oz (86.682 kg)  06/23/14 185 lb 11.2 oz (84.233 kg)      Other studies Reviewed: Additional studies/ records that were reviewed today include:  Records from Dr. Moshe Cipro.   ASSESSMENT AND PLAN:  CAD (coronary artery disease) Cath 2016: 1. Severe 3 vessel obstructive CAD 2. Patent but atretic LIMA to the LAD. The distal LAD is small and severely diseased. 3. Occluded SVG to the diagonal 4. Patent SVG to OM 5. Patent SVG to the PDA Angina free on current regimen.  Atrial fibrillation CHADSVasc at least 4. Anticoagulated with Xarelto (dose adjusted for low GFR). Will continue rate control strategy. Current metoprolol dose appears to be adequate.  Statin intolerance She is probably a very good candidate for Praluent or Repatha. Poor options for insurance coverage due to copay. Look into clinical trial enrollment. She has substantial elevation in LDL cholesterol. Target LDL <70 - requires a >60% reduction.  Systolic murmur By recent echo there is only aortic valve sclerosis without significant stenosis  Obesity (BMI 30.0-34.9) Weight loss is recommended to help with borderline diabetes and vascular risk.  Chronic kidney disease, stage 3 GFR around 30 mL/min    Current medicines are reviewed at length with  the patient today.  The patient does not have concerns regarding medicines.  The following changes have been made:  no change  Labs/ tests ordered today include:  No orders of the defined types were placed in this encounter.      Patient Instructions  Dr Sallyanne Kuster recommends that you schedule a follow-up appointment in 1 year. You will receive a reminder letter in the mail two months in advance. If you don't receive a letter, please call our office to schedule the follow-up appointment.  If you need a refill on your cardiac medications before your next appointment, please call your pharmacy.      Mikael Spray, MD  02/09/2015 1:32 PM    Sanda Klein, MD, [facility] [phone] office [phone] pager

## 2015-02-09 NOTE — Patient Instructions (Signed)
Dr Croitoru recommends that you schedule a follow-up appointment in 1 year. You will receive a reminder letter in the mail two months in advance. If you don't receive a letter, please call our office to schedule the follow-up appointment.  If you need a refill on your cardiac medications before your next appointment, please call your pharmacy. 

## 2015-02-11 ENCOUNTER — Ambulatory Visit (HOSPITAL_COMMUNITY)
Admission: RE | Admit: 2015-02-11 | Discharge: 2015-02-11 | Disposition: A | Discharge status: Home / Self Care | Payer: Medicare Other | Source: Ambulatory Visit | Attending: Internal Medicine | Admitting: Internal Medicine

## 2015-02-11 DIAGNOSIS — E0590 Thyrotoxicosis, unspecified without thyrotoxic crisis or storm: Secondary | ICD-10-CM

## 2015-04-14 ENCOUNTER — Other Ambulatory Visit: Payer: Self-pay | Admitting: Endocrinology

## 2015-04-14 ENCOUNTER — Telehealth: Payer: Self-pay | Admitting: Cardiovascular Disease

## 2015-04-14 ENCOUNTER — Encounter: Payer: Self-pay | Admitting: Cardiovascular Disease

## 2015-04-14 DIAGNOSIS — E041 Nontoxic single thyroid nodule: Secondary | ICD-10-CM

## 2015-04-14 NOTE — Telephone Encounter (Signed)
[facility] called - Dr. Chalmers Cater ordered thyroid biopsy. They would need permission from Dr. Sallyanne Kuster for patient to hold her Xarelto for 1 day prior to the procedure.  Would need letter furnished & faxed to them.  Will route to Dr. Sallyanne Kuster for OK.  Fax for [facility] [phone]

## 2015-04-14 NOTE — Telephone Encounter (Signed)
Returned Pamela's call, she is aware this has been addressed. Advised to call if further needs.

## 2015-04-14 NOTE — Telephone Encounter (Signed)
Sent via Epic

## 2015-05-20 ENCOUNTER — Ambulatory Visit
Admission: RE | Admit: 2015-05-20 | Discharge: 2015-05-20 | Disposition: A | Discharge status: Home / Self Care | Payer: Medicare Other | Source: Ambulatory Visit | Attending: Endocrinology | Admitting: Endocrinology

## 2015-05-20 ENCOUNTER — Other Ambulatory Visit (HOSPITAL_COMMUNITY)
Admission: RE | Admit: 2015-05-20 | Discharge: 2015-05-20 | Disposition: A | Discharge status: Home / Self Care | Payer: Medicare Other | Source: Ambulatory Visit | Attending: Radiology | Admitting: Radiology

## 2015-05-20 DIAGNOSIS — E041 Nontoxic single thyroid nodule: Secondary | ICD-10-CM | POA: Diagnosis present

## 2015-09-18 ENCOUNTER — Other Ambulatory Visit: Payer: Self-pay

## 2015-09-18 MED ORDER — METOPROLOL TARTRATE 50 MG PO TABS: 50.0000 mg | ORAL_TABLET | Freq: Two times a day (BID) | ORAL | Status: DC

## 2015-09-18 NOTE — Telephone Encounter (Signed)
Pharmacy sent sent over a request asking for directions for metoprolol, form completed and faxed back to insurance company.

## 2015-09-25 ENCOUNTER — Other Ambulatory Visit: Payer: Self-pay | Admitting: Pharmacist

## 2015-09-25 MED ORDER — RIVAROXABAN 15 MG PO TABS: 15.0000 mg | ORAL_TABLET | Freq: Every day | ORAL | Status: DC

## 2015-09-25 MED ORDER — QUINAPRIL HCL 5 MG PO TABS: 5.0000 mg | ORAL_TABLET | Freq: Every day | ORAL | Status: DC

## 2015-09-30 ENCOUNTER — Encounter: Payer: Self-pay | Admitting: Gastroenterology

## 2016-02-15 ENCOUNTER — Ambulatory Visit (INDEPENDENT_AMBULATORY_CARE_PROVIDER_SITE_OTHER): Payer: Medicare Other | Admitting: Cardiovascular Disease

## 2016-02-15 ENCOUNTER — Encounter: Payer: Self-pay | Admitting: Cardiovascular Disease

## 2016-02-15 VITALS — BP 112/56 | HR 92 | Ht 62.25 in | Wt 183.2 lb

## 2016-02-15 DIAGNOSIS — E669 Obesity, unspecified: Secondary | ICD-10-CM | POA: Diagnosis not present

## 2016-02-15 DIAGNOSIS — E7800 Pure hypercholesterolemia, unspecified: Secondary | ICD-10-CM

## 2016-02-15 DIAGNOSIS — I480 Paroxysmal atrial fibrillation: Secondary | ICD-10-CM | POA: Diagnosis not present

## 2016-02-15 DIAGNOSIS — N183 Chronic kidney disease, stage 3 unspecified: Secondary | ICD-10-CM

## 2016-02-15 DIAGNOSIS — I25810 Atherosclerosis of coronary artery bypass graft(s) without angina pectoris: Secondary | ICD-10-CM

## 2016-02-15 DIAGNOSIS — I10 Essential (primary) hypertension: Secondary | ICD-10-CM | POA: Diagnosis not present

## 2016-02-15 DIAGNOSIS — E66811 Obesity, class 1: Secondary | ICD-10-CM

## 2016-02-15 DIAGNOSIS — I872 Venous insufficiency (chronic) (peripheral): Secondary | ICD-10-CM

## 2016-02-15 MED ORDER — METOPROLOL TARTRATE 50 MG PO TABS: ORAL_TABLET | ORAL | 3 refills | Status: DC

## 2016-02-15 MED ORDER — QUINAPRIL HCL 5 MG PO TABS: 2.5000 mg | ORAL_TABLET | Freq: Every day | ORAL | 3 refills | Status: DC

## 2016-02-15 NOTE — Progress Notes (Signed)
Cardiology Office Note    Date:  02/15/2016   ID:  Sandra Bradford, Alferd Apa Jul 30, 1940, MRN [id number]  PCP:  Purvis Kilts, MD  Cardiologist:   Sanda Klein, MD   Chief Complaint  Patient presents with  . Follow-up    no chest pain, occassional shortness of breath, occassional edema and pain in legs    History of Present Illness:  Sandra Bradford is a 75 y.o. female with CAD s/p CABG 2002, permanent atrial fibrillation here for routine follow-up. She has had frequent episodes of dyspnea associated with a racing heartbeat, consistently associated with physical exertion. She has not had syncope and denies any dyspnea at rest, palpitations at rest or any chest discomfort. She has tried numerous statins but has not been able to tolerate any of them. She has chronic leg edema related to venous insufficiency that has generally been well controlled, although she does not like to wear compression stockings in the summertime. She has chronic kidney disease stage 3 followed by Dr. Moshe Cipro.  She's had 1 fall when she tripped over her flip-flops. Thankfully she did not hit her head. She did have a rather large bruise in her left breast and left shoulder. 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  Cath 2016: 1. Severe 3 vessel obstructive CAD 2. Patent but atretic LIMA to the LAD. The distal LAD is small and severely diseased. 3. Occluded SVG to the diagonal 4. Patent SVG to OM 5. Patent SVG to the PDA   Past Medical History:  Diagnosis Date  . A-fib ([place])   . Abnormal nuclear cardiac imaging test 05/01/2014  . Arthritis    "scattered" (06/10/2014)  . Carotid arterial disease ([place])   . Chronic bronchitis ([place])    "several times; haven't had it since I quit working in 2008" (06/10/2014)  . Chronic kidney disease, stage 3   . Coronary artery disease   . Diverticulitis   . DVT (deep venous thrombosis) ([place])  2001   "related to sitting around when I broke my foot"  . Heart murmur   . History of blood transfusion 01/2001   "related to OHS"   . Hyperlipemia   . PAD (peripheral artery disease) ([place])   . Pneumonia ~ 2010 X 2  . PONV (postoperative nausea and vomiting)   . 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 Systemic hypertension     Past Surgical History:  Procedure Laterality Date  . CARDIAC CATHETERIZATION  2002; 2005  . CARDIOVERSION N/A 05/08/2014   Procedure: CARDIOVERSION;  Surgeon: Sanda Klein, MD;  Location: [facility] ENDOSCOPY;  Service: Cardiovascular;  Laterality: N/A;  . CHOLECYSTECTOMY N/A 01/17/2013   Procedure: LAPAROSCOPIC CHOLECYSTECTOMY;  Surgeon: Donato Heinz, MD;  Location: [facility] ORS;  Service: General;  Laterality: N/A;  . COLONOSCOPY  July 2007   Dr. Oneida Alar: ascending and sigmoid colon diverticula  . COLONOSCOPY  Dec 2011   Dr. Oneida Alar: ischemic colitis. CTA performed with patent celiac, SMA, and IMA  . COLONOSCOPY N/A 08/06/2013   Procedure: COLONOSCOPY;  Surgeon: Daneil Dolin, MD;  Location: [facility] ENDO SUITE;  Service: Endoscopy;  Laterality: N/A;  8:30  . CORONARY ARTERY BYPASS GRAFT  01/2001   LIMA TO LAD,SVG TO DIAGONAL,SVG TO MARGINAL,SVG SEQUENTALLY TO THE PD & PL VESSEL  . KNEE ARTHROSCOPY Right ~ 1985  . LEFT HEART CATHETERIZATION WITH CORONARY/GRAFT ANGIOGRAM N/A 06/11/2014  Procedure: LEFT HEART CATHETERIZATION WITH Beatrix Fetters;  Surgeon: Peter M Martinique, MD;  Location: [facility] CATH LAB;  Service: Cardiovascular;  Laterality: N/A;  . MOHS SURGERY  ~ 04/2013   upper lip  . NM MYOCAR PERF WALL MOTION  08/18/2008   No significant ischemia  . TONSILLECTOMY  1953  . TUBAL LIGATION  ~ 1973  . US ECHOCARDIOGRAPHY  07/04/2008   trace MR,mild TR  . VAGINAL HYSTERECTOMY  1980    Current Medications: Outpatient Medications Prior to Visit  Medication Sig Dispense Refill  . acetaminophen (TYLENOL) 500 MG tablet Take 1,000 mg by mouth daily as needed  for mild pain.     . Biotin (BIOTIN MAXIMUM STRENGTH) 10 MG TABS Take 1 tablet by mouth daily.    . Cholecalciferol (VITAMIN D) 2000 UNITS CAPS Take 2,000 Units by mouth daily.    . furosemide (LASIX) 20 MG tablet Take 20 mg by mouth daily.     . Multiple Vitamin (MULITIVITAMIN WITH MINERALS) TABS Take 1 tablet by mouth daily.    . Rivaroxaban (XARELTO) 15 MG TABS tablet Take 1 tablet (15 mg total) by mouth daily with supper. 90 tablet 1  . metoprolol (LOPRESSOR) 50 MG tablet Take 1 tablet (50 mg total) by mouth 2 (two) times daily. 180 tablet 2  . quinapril (ACCUPRIL) 5 MG tablet Take 1 tablet (5 mg total) by mouth daily. 90 tablet 1   No facility-administered medications prior to visit.      Allergies:   Codeine and Statins   Social History   Social History  . Marital status: Divorced    Spouse name: N/A  . Number of children: N/A  . Years of education: N/A   Social History Main Topics  . Smoking status: Never Smoker  . Smokeless tobacco: Never Used  . Alcohol use Yes     Comment: 06/10/2014 "might have a beer after mowing yard or a drink at holidays"  . Drug use: No  . Sexual activity: No   Other Topics Concern  . None   Social History Narrative  . None     Family History:  The patient's family history includes Congestive Heart Failure in her father; Heart attack in her father and mother.   ROS:   Please see the history of present illness.    ROS All other systems reviewed and are negative.   PHYSICAL EXAM:   VS:  BP (!) 112/56 (BP Location: Left Arm, Patient Position: Sitting, Cuff Size: Normal)   Pulse 92   Ht 5' 2.25" (1.581 m)   Wt 183 lb 4 oz (83.1 kg)   LMP 04/25/1978   BMI 33.25 kg/m    GEN: Well nourished, well developed, in no acute distress  HEENT: normal  Neck: no JVD, carotid bruits, or masses Cardiac: irregular; 2/6 early peaking aortic ejection murmur, no diastolic murmurs, rubs, or gallops,no edema  Respiratory:  clear to auscultation  bilaterally, normal work of breathing GI: soft, nontender, nondistended, + BS MS: no deformity or atrophy  Skin: warm and dry, no rash Neuro:  Alert and Oriented x 3, Strength and sensation are intact Psych: euthymic mood, full affect  Wt Readings from Last 3 Encounters:  02/15/16 183 lb 4 oz (83.1 kg)  02/09/15 181 lb 8 oz (82.3 kg)  08/07/14 191 lb 1.6 oz (86.7 kg)      Studies/Labs Reviewed:   EKG:  EKG is ordered today.  The ekg ordered today demonstrates Atrial fibrillation, nonspecific T-wave changes,  QTc 435 ms  Recent Labs: No results found for requested labs within last 8760 hours.   Lipid Panel    Component Value Date/Time   CHOL 226 (H) 08/04/2014 0933   TRIG 96 08/04/2014 0933   HDL 51 08/04/2014 0933   CHOLHDL 4.4 08/04/2014 0933   VLDL 19 08/04/2014 0933   LDLCALC 156 (H) 08/04/2014 0933     ASSESSMENT:    1. PAF (paroxysmal atrial fibrillation) ([place])   2. Coronary artery disease involving coronary bypass graft of native heart without angina pectoris   3. Chronic kidney disease, stage 3   4. Pure hypercholesterolemia   5. Essential hypertension   6. Obesity (BMI 30.0-34.9)   7. Venous insufficiency, peripheral      PLAN:  In order of problems listed above:  1. AFib: Her symptoms may be related to inadequate rate control. She is only taking half of the dose of beta blocker that I believed she was on. Will increase her morning dose of metoprolol and cut back on the ACE inhibitor to avoid hypotension. If her symptoms persist, reevaluate echocardiogram. CHADSVasc at least 4. Anticoagulated with Xarelto (dose adjusted for low GFR). Need to get her most updated renal function tests. There was a creatinine of 1.1 documented in April and we may have to increase her to full dose Xarelto. 2. CAD s/p CABG: No angina pectoris and current medical regimen 3. CKD: Previously had estimated GFR around 30 mm/min, but the creatinine of 1.1 from January with suggest that  she has better kidney function. He to get the most recent results from Dr. Moshe Cipro. 4. HLP: Severely elevated LDL cholesterol, intolerant to all statins that we have tried. Will try to explore financial assistance for PCSK9 inhibitor. 5. HTN: Reduce ACE inhibitor dose to allow increase of rate control medication. 6. Venous insufficiency: Encourage her to wear the compression stockings all year long 7. Obesity: Continue to encourage weight loss    Medication Adjustments/Labs and Tests Ordered: Current medicines are reviewed at length with the patient today.  Concerns regarding medicines are outlined above.  Medication changes, Labs and Tests ordered today are listed in the Patient Instructions below. Patient Instructions  Dr Sallyanne Kuster has recommended making the following medication changes: 1. CHANGE the way you take Metoprolol - take 1 tablet every morning and 0.5 tablet every evening 2. DECREASE Quinapril to 0.5 tablet (2.5 mg total) by mouth once daily  Your physician recommends that you schedule a follow-up appointment in 3 months with Dr Sallyanne Kuster.  If you need a refill on your cardiac medications before your next appointment, please call your pharmacy.    Signed, Sanda Klein, MD  02/15/2016 4:56 PM    [address], [place], [place]  [address] Phone: [phone]; Fax: [phone]

## 2016-02-15 NOTE — Patient Instructions (Signed)
Dr Sallyanne Kuster has recommended making the following medication changes: 1. CHANGE the way you take Metoprolol - take 1 tablet every morning and 0.5 tablet every evening 2. DECREASE Quinapril to 0.5 tablet (2.5 mg total) by mouth once daily  Your physician recommends that you schedule a follow-up appointment in 3 months with Dr Sallyanne Kuster.  If you need a refill on your cardiac medications before your next appointment, please call your pharmacy.

## 2016-02-16 ENCOUNTER — Telehealth: Payer: Self-pay | Admitting: Pharmacist Clinician (PhC)/ Clinical Pharmacy Specialist

## 2016-02-16 NOTE — Telephone Encounter (Signed)
Spoke with patient - explained that if we try for approval of [employer] at this time, patient assistance will not be available, as [employer] Ready program expires every Dec 31.   Also gave her information about research foundation opportunities.  She will think about these options, as well as discuss costs with her insurance agent as she determines if her plan will change in 2018.    I will contact her again after Jan 1 when we will again be able to apply for the [place] Ready program.  Patient agreeable to this, but will call should she wish to look into the research foundation earlier.

## 2016-02-16 NOTE — Telephone Encounter (Signed)
Thanks

## 2016-03-02 ENCOUNTER — Other Ambulatory Visit: Payer: Self-pay | Admitting: Pharmacist

## 2016-03-02 MED ORDER — RIVAROXABAN 15 MG PO TABS: 15.0000 mg | ORAL_TABLET | Freq: Every day | ORAL | 1 refills | Status: DC

## 2016-03-10 ENCOUNTER — Telehealth: Payer: Self-pay | Admitting: Cardiovascular Disease

## 2016-03-10 NOTE — Telephone Encounter (Signed)
Pt of Dr. Sallyanne Kuster See visit notes/med changes 10/23 OV  Spoke to patient. She asked for clarification bc her quinapril dosage states 1/2 tablet daily (2.5mg ) and she's been taking 1 tablet for ~15 years.  She noted she is still taking the quinapril at 5mg  daily, was not aware of recommended changes at last visit. Became aware when she picked up new Rx. She did stated she is compliant w the dosage changes to metoprolol. Notes BPs at home are fine but she did not have readings on-hand for me.  She'll stay at the original dosage of quinapril unless otherwise instructed.  Routed to Dr. Sallyanne Kuster for any advice.

## 2016-03-10 NOTE — Telephone Encounter (Signed)
That is fine, she can stay on 5 mg Quinapril if no problems with low BP (I had planned to decrease quinapril since I increased metoprolol and was worried BP would become too low). MCr

## 2016-03-10 NOTE — Telephone Encounter (Signed)
New message    Patient calling    Pt c/o medication issue:  1. Name of Medication: quinapril (ACCUPRIL) 5 MG tablet  2. How are you currently taking this medication (dosage and times per day)? Once a day   3. Are you having a reaction (difficulty breathing--STAT)? No   4. What is your medication issue? Need clarification on direction / dosage on medication

## 2016-03-10 NOTE — Telephone Encounter (Signed)
Noted, patient aware to call if concerns or new symptoms.

## 2016-04-29 ENCOUNTER — Telehealth: Payer: Self-pay | Admitting: Pharmacist Clinician (PhC)/ Clinical Pharmacy Specialist

## 2016-04-29 NOTE — Telephone Encounter (Signed)
Spoke with patient, she has appt with Dr. Sallyanne Kuster

## 2016-05-04 DIAGNOSIS — N390 Urinary tract infection, site not specified: Secondary | ICD-10-CM | POA: Diagnosis not present

## 2016-05-04 DIAGNOSIS — Z6833 Body mass index (BMI) 33.0-33.9, adult: Secondary | ICD-10-CM | POA: Diagnosis not present

## 2016-05-04 DIAGNOSIS — Z90711 Acquired absence of uterus with remaining cervical stump: Secondary | ICD-10-CM | POA: Diagnosis not present

## 2016-05-04 DIAGNOSIS — R1032 Left lower quadrant pain: Secondary | ICD-10-CM | POA: Diagnosis not present

## 2016-05-04 DIAGNOSIS — M816 Localized osteoporosis [Lequesne]: Secondary | ICD-10-CM | POA: Diagnosis not present

## 2016-05-04 DIAGNOSIS — N958 Other specified menopausal and perimenopausal disorders: Secondary | ICD-10-CM | POA: Diagnosis not present

## 2016-05-04 DIAGNOSIS — Z124 Encounter for screening for malignant neoplasm of cervix: Secondary | ICD-10-CM | POA: Diagnosis not present

## 2016-05-04 DIAGNOSIS — Z1231 Encounter for screening mammogram for malignant neoplasm of breast: Secondary | ICD-10-CM | POA: Diagnosis not present

## 2016-05-04 DIAGNOSIS — Z1272 Encounter for screening for malignant neoplasm of vagina: Secondary | ICD-10-CM | POA: Diagnosis not present

## 2016-05-18 ENCOUNTER — Telehealth: Payer: Self-pay | Admitting: Cardiovascular Disease

## 2016-05-18 DIAGNOSIS — E785 Hyperlipidemia, unspecified: Secondary | ICD-10-CM

## 2016-05-18 NOTE — Telephone Encounter (Signed)
Returned call to patient.   Patient states "a lady" called her a few weeks back regarding a trial. There is a tele note in EPIC from [facility] on 1/5  She states she was supposed to have a lab order mailed to her to get blood work prior to her appt with Dr. Loletha Grayer on Jan 29  Patient uses [facility] in [place].   Patient aware she will be notified when lab order is in system so she can get blood work done

## 2016-05-18 NOTE — Telephone Encounter (Signed)
New message    Patient calling have not heard from the nurse regarding form.

## 2016-05-19 ENCOUNTER — Other Ambulatory Visit (HOSPITAL_COMMUNITY): Admission: RE | Admit: 2016-05-19 | Payer: PPO | Source: Ambulatory Visit | Admitting: Cardiovascular Disease

## 2016-05-19 NOTE — Telephone Encounter (Signed)
LMOM for patient.   Labs ordered in system.  There are 2 [facility] offices in [place], so not sure which she goes to, so had lab order printed for patient to pick up from [facility] office.

## 2016-05-20 ENCOUNTER — Other Ambulatory Visit (HOSPITAL_COMMUNITY)
Admission: RE | Admit: 2016-05-20 | Discharge: 2016-05-20 | Disposition: A | Discharge status: Home / Self Care | Payer: PPO | Source: Ambulatory Visit | Attending: Cardiovascular Disease | Admitting: Cardiovascular Disease

## 2016-05-20 DIAGNOSIS — E785 Hyperlipidemia, unspecified: Secondary | ICD-10-CM | POA: Diagnosis not present

## 2016-05-20 LAB — LIPID PANEL
CHOL/HDL RATIO: 4.5 ratio
CHOLESTEROL: 253 mg/dL — AB (ref 0–200)
HDL: 56 mg/dL (ref >40)
LDL Cholesterol: 175 mg/dL — ABNORMAL HIGH (ref 0–99)
TRIGLYCERIDES: 108 mg/dL (ref <150)
VLDL: 22 mg/dL (ref 0–40)

## 2016-05-20 LAB — HEPATIC FUNCTION PANEL
ALK PHOS: 55 U/L (ref 38–126)
ALT: 21 U/L (ref 14–54)
AST: 33 U/L (ref 15–41)
Albumin: 3.9 g/dL (ref 3.5–5.0)
Bilirubin, Direct: 0.2 mg/dL (ref 0.1–0.5)
Indirect Bilirubin: 0.9 mg/dL (ref 0.3–0.9)
TOTAL PROTEIN: 8 g/dL (ref 6.5–8.1)
Total Bilirubin: 1.1 mg/dL (ref 0.3–1.2)

## 2016-05-23 ENCOUNTER — Encounter: Payer: Self-pay | Admitting: Cardiovascular Disease

## 2016-05-23 ENCOUNTER — Ambulatory Visit (INDEPENDENT_AMBULATORY_CARE_PROVIDER_SITE_OTHER): Payer: PPO | Admitting: Cardiovascular Disease

## 2016-05-23 VITALS — BP 152/88 | HR 87 | Ht 62.0 in | Wt 184.0 lb

## 2016-05-23 DIAGNOSIS — I25810 Atherosclerosis of coronary artery bypass graft(s) without angina pectoris: Secondary | ICD-10-CM | POA: Diagnosis not present

## 2016-05-23 DIAGNOSIS — I10 Essential (primary) hypertension: Secondary | ICD-10-CM

## 2016-05-23 DIAGNOSIS — I4819 Other persistent atrial fibrillation: Secondary | ICD-10-CM

## 2016-05-23 DIAGNOSIS — N183 Chronic kidney disease, stage 3 unspecified: Secondary | ICD-10-CM

## 2016-05-23 DIAGNOSIS — E7800 Pure hypercholesterolemia, unspecified: Secondary | ICD-10-CM

## 2016-05-23 DIAGNOSIS — R0602 Shortness of breath: Secondary | ICD-10-CM | POA: Diagnosis not present

## 2016-05-23 DIAGNOSIS — I481 Persistent atrial fibrillation: Secondary | ICD-10-CM | POA: Diagnosis not present

## 2016-05-23 DIAGNOSIS — E669 Obesity, unspecified: Secondary | ICD-10-CM

## 2016-05-23 MED ORDER — QUINAPRIL HCL 5 MG PO TABS: 2.5000 mg | ORAL_TABLET | Freq: Every day | ORAL | 3 refills | Status: DC

## 2016-05-23 MED ORDER — METOPROLOL TARTRATE 50 MG PO TABS: 50.0000 mg | ORAL_TABLET | Freq: Two times a day (BID) | ORAL | 3 refills | Status: DC

## 2016-05-23 MED ORDER — FUROSEMIDE 20 MG PO TABS: 20.0000 mg | ORAL_TABLET | Freq: Every day | ORAL | 3 refills | Status: DC

## 2016-05-23 MED ORDER — RIVAROXABAN 15 MG PO TABS: 15.0000 mg | ORAL_TABLET | Freq: Every day | ORAL | 3 refills | Status: DC

## 2016-05-23 NOTE — Progress Notes (Addendum)
Cardiology Office Note    Date:  05/23/2016   ID:  Sandra Bradford, Sandra Bradford 11/04/1940, MRN [id number]  PCP:  Purvis Kilts, MD  Cardiologist:   Sanda Klein, MD   Chief Complaint  Patient presents with  . Follow-up    History of Present Illness:  Sandra Bradford is a 76 y.o. female with CAD s/p CABG 2002, permanent atrial fibrillation here for routine follow-up. She has Improved some after we increase her dose of beta blocker, but still has occasional episodes of dyspnea associated with a racing heartbeat, consistently associated with physical exertion. At times she even notices some aching in her left jaw/ear area which might be angina pectoris. She has not had syncope and denies any dyspnea at rest, palpitations at rest or any chest discomfort. She has tried numerous statins but has not been able to tolerate any of them. She has chronic leg edema related to venous insufficiency that has generally been well controlled. She has chronic kidney disease stage 3 followed by Dr. Moshe Cipro. Her creatinine was 1.39 (estimated GFR 37) by labs performed on 12/15/2015.  She has not had any recent falls or bleeding problems.  Cath 2016: 1. Severe 3 vessel obstructive CAD 2. Patent but atretic LIMA to the LAD. The distal LAD is small and severely diseased. 3. Occluded SVG to the diagonal 4. Patent SVG to OM 5. Patent SVG to the PDA   Past Medical History:  Diagnosis Date  . A-fib ([place])   . Abnormal nuclear cardiac imaging test 05/01/2014  . Arthritis    "scattered" (06/10/2014)  . Carotid arterial disease ([place])   . Chronic bronchitis ([place])    "several times; haven't had it since I quit working in 2008" (06/10/2014)  . Chronic kidney disease, stage 3   . Coronary artery disease   . Diverticulitis   . DVT (deep venous thrombosis) ([place]) 2001   "related to sitting around when I broke my foot"  . Heart murmur   . History of blood transfusion 01/2001   "related to OHS"   . Hyperlipemia   .  PAD (peripheral artery disease) ([place])   . Pneumonia ~ 2010 X 2  . PONV (postoperative nausea and vomiting)   . 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 Systemic hypertension     Past Surgical History:  Procedure Laterality Date  . CARDIAC CATHETERIZATION  2002; 2005  . CARDIOVERSION N/A 05/08/2014   Procedure: CARDIOVERSION;  Surgeon: Sanda Klein, MD;  Location: [facility] ENDOSCOPY;  Service: Cardiovascular;  Laterality: N/A;  . CHOLECYSTECTOMY N/A 01/17/2013   Procedure: LAPAROSCOPIC CHOLECYSTECTOMY;  Surgeon: Donato Heinz, MD;  Location: [facility] ORS;  Service: General;  Laterality: N/A;  . COLONOSCOPY  July 2007   Dr. Oneida Alar: ascending and sigmoid colon diverticula  . COLONOSCOPY  Dec 2011   Dr. Oneida Alar: ischemic colitis. CTA performed with patent celiac, SMA, and IMA  . COLONOSCOPY N/A 08/06/2013   Procedure: COLONOSCOPY;  Surgeon: Daneil Dolin, MD;  [facility] ENDO SU[facility];  Service: Endoscopy;  Laterality: N/A;  8:30  . CORONARY ARTERY BYPASS GRAFT  01/2001   LIMA TO LAD,SVG TO DIAGONAL,SVG TO MARGINAL,SVG SEQUENTALLY TO THE PD & PL VESSEL  . KNEE ARTHROSCOPY Right ~ 1985  . LEFT HEART CATHETERIZATION WITH CORONARY/GRAFT ANGIOGRAM N/A 06/11/2014   Procedure: LEFT HEART CATHETERIZATION WITH Beatrix Fetters;  Surgeon: Peter M Martinique, MD;  Location: [facility] CATH LAB;  Service: Cardiovascular;  Laterality: N/A;  . MOHS  SURGERY  ~ 04/2013   upper lip  . NM MYOCAR PERF WALL MOTION  08/18/2008   No significant ischemia  . TONSILLECTOMY  1953  . TUBAL LIGATION  ~ 1973  . US ECHOCARDIOGRAPHY  07/04/2008   trace MR,mild TR  . VAGINAL HYSTERECTOMY  1980    Current Medications: Outpatient Medications Prior to Visit  Medication Sig Dispense Refill  . furosemide (LASIX) 20 MG tablet Take 20 mg by mouth daily.     . metoprolol (LOPRESSOR) 50 MG tablet Take 1 tablet (50 mg total) by mouth every morning and take 0.5 tablet (25 mg total) by mouth every evening. 135 tablet 3  .  Rivaroxaban (XARELTO) 15 MG TABS tablet Take 1 tablet (15 mg total) by mouth daily with supper. 90 tablet 1  . acetaminophen (TYLENOL) 500 MG tablet Take 1,000 mg by mouth daily as needed for mild pain.     . Biotin (BIOTIN MAXIMUM STRENGTH) 10 MG TABS Take 1 tablet by mouth daily.    . Cholecalciferol (VITAMIN D) 2000 UNITS CAPS Take 2,000 Units by mouth daily.    . Multiple Vitamin (MULITIVITAMIN WITH MINERALS) TABS Take 1 tablet by mouth daily.    . quinapril (ACCUPRIL) 5 MG tablet Take 0.5 tablets (2.5 mg total) by mouth daily. 45 tablet 3   No facility-administered medications prior to visit.      Allergies:   Codeine and Statins   Social History   Social History  . Marital status: Divorced    Spouse name: N/A  . Number of children: N/A  . Years of education: N/A   Social History Main Topics  . Smoking status: Never Smoker  . Smokeless tobacco: Never Used  . Alcohol use Yes     Comment: 06/10/2014 "might have a beer after mowing yard or a drink at holidays"  . Drug use: No  . Sexual activity: No   Other Topics Concern  . Not on file   Social History Narrative  . No narrative on file     Family History:  The patient's family history includes Congestive Heart Failure in her father; Heart attack in her father and mother.   ROS:   Please see the history of present illness.    ROS All other systems reviewed and are negative.   PHYSICAL EXAM:   VS:  BP (!) 152/88 (BP Location: Right Arm, Patient Position: Sitting, Cuff Size: Normal)   Pulse 87   Ht 5\' 2"  (1.575 m)   Wt 83.5 kg (184 lb)   LMP 04/25/1978   SpO2 97%   BMI 33.65 kg/m    GEN: Well nourished, well developed, in no acute distress  HEENT: normal  Neck: no JVD, carotid bruits, or masses Cardiac: irregular; 2/6 early peaking aortic ejection murmur, no diastolic murmurs, rubs, or gallops,no edema  Respiratory:  clear to auscultation bilaterally, normal work of breathing GI: soft, nontender, nondistended, +  BS MS: no deformity or atrophy  Skin: warm and dry, no rash Neuro:  Alert and Oriented x 3, Strength and sensation are intact Psych: euthymic mood, full affect  Wt Readings from Last 3 Encounters:  05/23/16 83.5 kg (184 lb)  02/15/16 83.1 kg (183 lb 4 oz)  02/09/15 82.3 kg (181 lb 8 oz)      Studies/Labs Reviewed:   EKG:  EKG is ordered today.  The ekg ordered today demonstrates Atrial fibrillation, nonspecific T-wave changes, QTc 435 ms  Recent Labs: 05/20/2016: ALT 21   Lipid Panel  Component Value Date/Time   CHOL 253 (H) 05/20/2016 0842   TRIG 108 05/20/2016 0842   HDL 56 05/20/2016 0842   CHOLHDL 4.5 05/20/2016 0842   VLDL 22 05/20/2016 0842   LDLCALC 175 (H) 05/20/2016 8756     ASSESSMENT:    1. Shortness of breath   2. Persistent atrial fibrillation ([place])   3. Coronary artery disease involving coronary bypass graft of native heart without angina pectoris   4. Chronic kidney disease, stage 3   5. Pure hypercholesterolemia   6. Essential hypertension   7. Obesity (BMI 30.0-34.9)      PLAN:  In order of problems listed above:  1. Dyspnea: Probably a symptom of diastolic heart failure in the setting of atrial fibrillation with rapid ventricular response. Check echo. 2. AFib: Her symptoms still appear to be related to inadequate rate control. Will increase the beta blocker again. If her symptoms persist, reevaluate echocardiogram. CHADSVasc at least 4. Anticoagulated with Xarelto (dose adjusted for low GFR). Will recheck her echo. 3. CAD s/p CABG: She seems to be describing mild exertional angina, possibly due to poor ventricular rate control. 4. CKD: estimated GFR 35-40 mL/min, but has been even lower in the recent past (27-30 one year ago). Will keep on lower dose Xarelto. 5. HLP: Severely elevated LDL cholesterol, intolerant to all statins that we have tried. Will try to explore financial assistance for PCSK9 inhibitor. Also consider enrollment in the [facility]  trial 6. HTN: Typically at home blood pressure is in the 120/80 range. 7. Obesity: Continue to encourage weight loss.    Medication Adjustments/Labs and Tests Ordered: Current medicines are reviewed at length with the patient today.  Concerns regarding medicines are outlined above.  Medication changes, Labs and Tests ordered today are listed in the Patient Instructions below. Patient Instructions  Medication Instructions: Dr Sallyanne Kuster has recommended making the following medication changes: 1. INCREASE Metoprolol to 50 mg TWICE daily  Labwork: NONE ORDERED  Testing/Procedures: 1. Echocardiogram - Your physician has requested that you have an echocardiogram. Echocardiography is a painless test that uses sound waves to create images of your heart. It provides your doctor with information about the size and shape of your heart and how well your heart's chambers and valves are working. This procedure takes approximately one hour. There are no restrictions for this procedure. This will be performed at our [facility] location - [address].  Follow-up: Dr Sallyanne Kuster recommends that you schedule a follow-up appointment in 3 months.  If you need a refill on your cardiac medications before your next appointment, please call your pharmacy.    Signed, Sanda Klein, MD  05/23/2016 10:03 AM    [facility] [address] [place], [place],   [address] Phone: [phone]; Fax: [phone]

## 2016-05-23 NOTE — Patient Instructions (Signed)
Medication Instructions: Dr Sallyanne Kuster has recommended making the following medication changes: 1. INCREASE Metoprolol to 50 mg TWICE daily  Labwork: NONE ORDERED  Testing/Procedures: 1. Echocardiogram - Your physician has requested that you have an echocardiogram. Echocardiography is a painless test that uses sound waves to create images of your heart. It provides your doctor with information about the size and shape of your heart and how well your heart's chambers and valves are working. This procedure takes approximately one hour. There are no restrictions for this procedure. This will be performed at our [facility] location - [address], [address].  Follow-up: Dr Sallyanne Kuster recommends that you schedule a follow-up appointment in 3 months.  If you need a refill on your cardiac medications before your next appointment, please call your pharmacy.

## 2016-05-25 ENCOUNTER — Telehealth: Payer: Self-pay | Admitting: Pharmacist Clinician (PhC)/ Clinical Pharmacy Specialist

## 2016-05-25 NOTE — Telephone Encounter (Signed)
Spoke with patient, her copay would be 1/3 cost of medication.  At $14,000 that would be about $350 per month.  She has chosen to not pursue any cholesterol treatment at this time, but will think about it for the future.  She thanked [place] for our time and effort on her behalf.

## 2016-06-01 DIAGNOSIS — R102 Pelvic and perineal pain: Secondary | ICD-10-CM | POA: Diagnosis not present

## 2016-06-07 ENCOUNTER — Ambulatory Visit (HOSPITAL_COMMUNITY): Payer: PPO | Attending: Cardiology

## 2016-06-07 ENCOUNTER — Other Ambulatory Visit: Payer: Self-pay

## 2016-06-07 DIAGNOSIS — I340 Nonrheumatic mitral (valve) insufficiency: Secondary | ICD-10-CM | POA: Diagnosis not present

## 2016-06-07 DIAGNOSIS — I361 Nonrheumatic tricuspid (valve) insufficiency: Secondary | ICD-10-CM | POA: Insufficient documentation

## 2016-06-07 DIAGNOSIS — R0602 Shortness of breath: Secondary | ICD-10-CM | POA: Diagnosis not present

## 2016-06-07 DIAGNOSIS — I358 Other nonrheumatic aortic valve disorders: Secondary | ICD-10-CM | POA: Insufficient documentation

## 2016-06-07 DIAGNOSIS — R9439 Abnormal result of other cardiovascular function study: Secondary | ICD-10-CM | POA: Insufficient documentation

## 2016-07-19 DIAGNOSIS — C44319 Basal cell carcinoma of skin of other parts of face: Secondary | ICD-10-CM | POA: Diagnosis not present

## 2016-07-19 DIAGNOSIS — C44311 Basal cell carcinoma of skin of nose: Secondary | ICD-10-CM | POA: Diagnosis not present

## 2016-08-15 DIAGNOSIS — M818 Other osteoporosis without current pathological fracture: Secondary | ICD-10-CM | POA: Diagnosis not present

## 2016-08-15 DIAGNOSIS — M8580 Other specified disorders of bone density and structure, unspecified site: Secondary | ICD-10-CM | POA: Diagnosis not present

## 2016-08-15 DIAGNOSIS — K625 Hemorrhage of anus and rectum: Secondary | ICD-10-CM | POA: Diagnosis not present

## 2016-08-16 ENCOUNTER — Ambulatory Visit: Payer: PPO | Admitting: Cardiovascular Disease

## 2016-08-16 DIAGNOSIS — R600 Localized edema: Secondary | ICD-10-CM | POA: Diagnosis not present

## 2016-08-16 DIAGNOSIS — C44719 Basal cell carcinoma of skin of left lower limb, including hip: Secondary | ICD-10-CM | POA: Diagnosis not present

## 2016-08-17 ENCOUNTER — Telehealth: Payer: Self-pay | Admitting: Cardiovascular Disease

## 2016-08-17 NOTE — Telephone Encounter (Signed)
Pt notified of Dr C recommendations and reiterated that she is only to be off for no more than 7 days and she states that she has left a message for an appt with her PCP to discuss bleeding/colonoscopy.

## 2016-08-17 NOTE — Telephone Encounter (Signed)
Spoke with pt she states that she woke up last night at 2am and went to the bathroom and she states that the bowl was full of bright red blood. She states that she did not have a bowel movement at that time. She states that she has no h/o hemorrhoids she states that this happened Sunday and also Friday. For a total of 5 times in the last 2 weeks. Pt states that she is overdue for colonoscopy and will call PCP and try to schedule appt.  Should pt stop Rivaroxaban Sandra Bradford), please advise.

## 2016-08-17 NOTE — Telephone Encounter (Signed)
It is okay to temporarily interrupt Xarelto. Preferably for no more than 5-7 days until the bleeding stops.

## 2016-08-17 NOTE — Telephone Encounter (Signed)
New Message    Pt c/o medication issue:  1. Name of Medication: Rivaroxaban (XARELTO) 15 MG TABS tablet  2. How are you currently taking this medication (dosage and times per day)? As Prescribed  3. Are you having a reaction (difficulty breathing--STAT)? No  4. What is your medication issue? Per pt has been experiencing some rectal bleeding since taking medication. Requesting call back

## 2016-08-18 ENCOUNTER — Telehealth: Payer: Self-pay

## 2016-08-18 ENCOUNTER — Other Ambulatory Visit: Payer: Self-pay

## 2016-08-18 ENCOUNTER — Ambulatory Visit (INDEPENDENT_AMBULATORY_CARE_PROVIDER_SITE_OTHER): Payer: PPO | Admitting: Gastroenterology

## 2016-08-18 ENCOUNTER — Encounter: Payer: Self-pay | Admitting: Gastroenterology

## 2016-08-18 VITALS — BP 146/86 | HR 102 | Temp 97.0°F | Ht 62.25 in | Wt 179.0 lb

## 2016-08-18 DIAGNOSIS — K625 Hemorrhage of anus and rectum: Secondary | ICD-10-CM

## 2016-08-18 MED ORDER — HYDROCORTISONE 2.5 % RE CREA: 1.0000 "application " | TOPICAL_CREAM | Freq: Two times a day (BID) | RECTAL | 0 refills | Status: DC

## 2016-08-18 MED ORDER — PEG 3350-KCL-NA BICARB-NACL 420 G PO SOLR: 4000.0000 mL | ORAL | 0 refills | Status: DC

## 2016-08-18 NOTE — Progress Notes (Signed)
Primary Care Physician:  Purvis Kilts, MD Primary Gastroenterologist:  Dr. Gala Romney   Chief Complaint  Patient presents with  . Rectal Bleeding    bright red, last tcs 2015  . Abdominal Pain    right lower    HPI:   Sandra Bradford is a 76 y.o. female presenting today with history of diverticulitis in 2015, last colonoscopy in 2015 without polyps. Last seen in 2015. Here for rectal bleeding. She is on Xarelto for afib, which has been held since Monday, so a total of 4 days as of today and to be off no longer than 7 days per her cardiologist.   Painless hematochezia, onset 2 weeks ago. Six total episodes of rectal bleeding. Went to bathroom, had a BM and denied constipation, and noted a lot of blood. The next morning had some. Some BMs without blood, some with blood. Bleeding started about 2 weeks ago. States she hasn't strained at all. Last episode of bleeding was last night.   Chronic RLQ discomfort, mild. States she will bump something and it will hurt. Otherwise, no issues. Had hysterectomy but ovaries remain.   Outside labs from April 23 with Hgb 13.6.     Past Medical History:  Diagnosis Date  . A-fib ([place])   . Abnormal nuclear cardiac imaging test 05/01/2014  . Arthritis    "scattered" (06/10/2014)  . Carotid arterial disease ([place])   . Chronic bronchitis ([place])    "several times; haven't had it since I quit working in 2008" (06/10/2014)  . Chronic kidney disease, stage 3   . Coronary artery disease   . Diverticulitis   . DVT (deep venous thrombosis) ([place]) 2001   "related to sitting around when I broke my foot"  . Heart murmur   . History of blood transfusion 01/2001   "related to OHS"   . Hyperlipemia   . PAD (peripheral artery disease) ([place])   . Pneumonia ~ 2010 X 2  . PONV (postoperative nausea and vomiting)   . 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 Systemic hypertension     Past Surgical History:  Procedure Laterality Date  . CARDIAC  CATHETERIZATION  2002; 2005  . CARDIOVERSION N/A 05/08/2014   Procedure: CARDIOVERSION;  Surgeon: Sanda Klein, MD;  Location: [facility];  Service: Cardiovascular;  Laterality: N/A;  . CHOLECYSTECTOMY N/A 01/17/2013   Procedure: LAPAROSCOPIC CHOLECYSTECTOMY;  Surgeon: Donato Heinz, MD;  Location: [facility];  Service: General;  Laterality: N/A;  . COLONOSCOPY  July 2007   Dr. Oneida Alar: ascending and sigmoid colon diverticula  . COLONOSCOPY  Dec 2011   Dr. Oneida Alar: ischemic colitis. CTA performed with patent celiac, SMA, and IMA  . COLONOSCOPY N/A 08/06/2013   Dr. Gala Romney: colonic diverticulosis, internal hemorrhoids   . CORONARY ARTERY BYPASS GRAFT  01/2001   LIMA TO LAD,SVG TO DIAGONAL,SVG TO MARGINAL,SVG SEQUENTALLY TO THE PD & PL VESSEL  . KNEE ARTHROSCOPY Right ~ 1985  . LEFT HEART CATHETERIZATION WITH CORONARY/GRAFT ANGIOGRAM N/A 06/11/2014   Procedure: LEFT HEART CATHETERIZATION WITH Beatrix Fetters;  Surgeon: Peter M Martinique, MD;  Location: [facility] C[facility];  Service: Cardiovascular;  Laterality: N/A;  . MOHS SURGERY  ~ 04/2013   upper lip  . NM MYOCAR PERF WALL MOTION  08/18/2008   No significant ischemia  . TONSILLECTOMY  1953  . TUBAL LIGATION  ~ 1973  . US ECHOCARDIOGRAPHY  07/04/2008   trace MR,mild TR  . VAGINAL HYSTERECTOMY  1980    Current Outpatient Prescriptions  Medication Sig Dispense Refill  . acetaminophen (TYLENOL) 325 MG tablet Take 650 mg by mouth every 6 (six) hours as needed.    . Cholecalciferol (VITAMIN D) 2000 UNITS CAPS Take 2,000 Units by mouth daily.    . Flaxseed, Linseed, (FLAXSEED OIL) 1000 MG CAPS Take 1 capsule by mouth daily.    . furosemide (LASIX) 20 MG tablet Take 1 tablet (20 mg total) by mouth daily. 90 tablet 3  . metoprolol (LOPRESSOR) 50 MG tablet Take 1 tablet (50 mg total) by mouth 2 (two) times daily. (Patient taking differently: Take 25 mg by mouth 2 (two) times daily. ) 180 tablet 3  . Multiple Vitamin (MULITIVITAMIN WITH MINERALS) TABS  Take 1 tablet by mouth daily.    . quinapril (ACCUPRIL) 5 MG tablet Take 0.5 tablets (2.5 mg total) by mouth daily. 45 tablet 3  . Rivaroxaban (XARELTO) 15 MG TABS tablet Take 1 tablet (15 mg total) by mouth daily with supper. 90 tablet 3  . acetaminophen (TYLENOL) 500 MG tablet Take 1,000 mg by mouth daily as needed for mild pain.     . Biotin (BIOTIN MAXIMUM STRENGTH) 10 MG TABS Take 1 tablet by mouth daily.     No current facility-administered medications for this visit.     Allergies as of 08/18/2016 - Review Complete 08/18/2016  Allergen Reaction Noted  . Codeine Nausea Only 05/24/2011  . Statins Other (See Comments) 11/09/2012    Family History  Problem Relation Age of Onset  . Heart attack Mother   . Heart attack Father   . Congestive Heart Failure Father   . Colon cancer Neg Hx     Social History   Social History  . Marital status: Divorced    Spouse name: N/A  . Number of children: N/A  . Years of education: N/A   Occupational History  . Not on file.   Social History Main Topics  . Smoking status: Never Smoker  . Smokeless tobacco: Never Used  . Alcohol use Yes     Comment: 06/10/2014 "might have a beer after mowing yard or a drink at holidays"  . Drug use: No  . Sexual activity: No   Other Topics Concern  . Not on file   Social History Narrative  . No narrative on file    Review of Systems: Gen: Denies any fever, chills, fatigue, weight loss, lack of appetite.  CV: Denies chest pain, heart palpitations, peripheral edema, syncope.  Resp: Denies shortness of breath at rest or with exertion. Denies wheezing or cough.  GI: see HPI  GU : Denies urinary burning, urinary frequency, urinary hesitancy MS: Denies joint pain, muscle weakness, cramps, or limitation of movement.  Derm: Denies rash, itching, dry skin Psych: Denies depression, anxiety, memory loss, and confusion Heme: see HPI   Physical Exam: BP (!) 146/86   Pulse (!) 102   Temp 97 F (36.1  C) (Oral)   Ht 5' 2.25" (1.581 m)   Wt 179 lb (81.2 kg)   LMP 04/25/1978   BMI 32.48 kg/m  General:   Alert and oriented. Pleasant and cooperative. Well-nourished and well-developed.  Head:  Normocephalic and atraumatic. Eyes:  Without icterus, sclera clear and conjunctiva pink.  Ears:  Normal auditory acuity. Nose:  No deformity, discharge,  or lesions. Mouth:  No deformity or lesions, oral mucosa pink.  Lungs:  Clear to auscultation bilaterally. No wheezes, rales, or rhonchi. No distress.  Heart:  S1, S2 present, irregularly irregular Abdomen:  +BS, soft, non-tender and non-distended. No HSM noted. No guarding or rebound. No masses appreciated.  Rectal:  No obvious fissure, no thrombosed hemorrhoids, internal exam with possible posterior internal hemorrhoid, no evidence of gross bleeding, stool brown  Msk:  Symmetrical without gross deformities. Normal posture. Extremities:  Without edema. Neurologic:  Alert and  oriented x4;  grossly normal neurologically. Psych:  Alert and cooperative. Normal mood and affect.

## 2016-08-18 NOTE — Assessment & Plan Note (Addendum)
76 year old female presenting with several episodes of painless low to moderate volume hematochezia, hemodynamically stable, asymptomatic, with history significant for afib on Xarelto. This has been held for 4 days thus far per cardiology, with instructions to hold for a week and resume. Her Hgb is normal.  Last colonoscopy in 2015 with internal hemorrhoids and diverticulosis. She is not actively bleeding at time of appt and rectal exam is benign. I highly suspect benign anorectal source in the setting of anticoagulation. Does not appear to be ischemic or diverticular in origin, and I have asked her to monitor for any worsening of bleeding or any dizziness, light-headedness, abdominal pain, etc. She is quite anxious, and I assured her I do not feel she needs to be hospitalized right now and again reviewed signs/symptoms that would prompt admission. As it has been 3 years since her last colonoscopy, will update this in the near future.   Proceed with TCS with Dr. Gala Romney in near future: the risks, benefits, and alternatives have been discussed with the patient in detail. The patient states understanding and desires to proceed. Proctozone BID To ED if any worsening Xarelto on hold for now as per cardiology recommendations for no more than 7 days: her colonoscopy will be occurring in line with this timeframe, fortunately. Anticipate resuming that evening.

## 2016-08-18 NOTE — Patient Instructions (Signed)
PA # for TCS: [id number]

## 2016-08-18 NOTE — Telephone Encounter (Signed)
Dr. Sallyanne Kuster,    This mutual patient was seen in our office today by Roseanne Kaufman, NP for rectal bleeding.  She has been scheduled a colonoscopy for 08/22/2016 with Dr. Gala Romney.  We saw you had told her that it was OK to [place] for 5-7 days on 08/17/2016.  We are aware this colonoscopy will be within that time period, just wanted you to know she is scheduled.  Please advise if any other recommendations.  Thanks so much!  Abhishek Levesque H. Nicole Kindred, LPN

## 2016-08-18 NOTE — Patient Instructions (Addendum)
If you start having fatigue, shortness of breath, feeling light-headed, you would need to go to the emergency room.  Your hemoglobin is stable, you aren't anemic, and your vital signs are good. We are going to pursue a colonoscopy next week to get to the bottom of this.  We are checking with cardiology to make sure we can just leave you off the Xarelto until Monday.   Use the cream per rectum twice a day until at least you are finished with colonoscopy and we see what it shows.

## 2016-08-19 NOTE — Progress Notes (Signed)
cc'd to pcp 

## 2016-08-19 NOTE — Telephone Encounter (Signed)
PT is aware.

## 2016-08-19 NOTE — Telephone Encounter (Signed)
Sandra Bradford, I have not heard back from Dr. Sallyanne Kuster yet. Pt is expecting a call from [place] today, and since we leave at noon today, can you please advise?

## 2016-08-19 NOTE — Telephone Encounter (Signed)
She will be right at 7 days at time of colonoscopy, so this is within the timeframe per cardiology recommendations. After colonoscopy, anticipate resuming Xarelto that evening.

## 2016-08-20 ENCOUNTER — Emergency Department (HOSPITAL_COMMUNITY): Payer: PPO

## 2016-08-20 ENCOUNTER — Encounter (HOSPITAL_COMMUNITY): Payer: Self-pay | Admitting: *Deleted

## 2016-08-20 ENCOUNTER — Other Ambulatory Visit: Payer: Self-pay

## 2016-08-20 ENCOUNTER — Emergency Department (HOSPITAL_COMMUNITY)
Admission: EM | Admit: 2016-08-20 | Discharge: 2016-08-20 | Disposition: A | Discharge status: Home / Self Care | Payer: PPO | Attending: Emergency Medicine | Admitting: Emergency Medicine

## 2016-08-20 DIAGNOSIS — M25512 Pain in left shoulder: Secondary | ICD-10-CM | POA: Diagnosis not present

## 2016-08-20 DIAGNOSIS — N183 Chronic kidney disease, stage 3 (moderate): Secondary | ICD-10-CM | POA: Diagnosis not present

## 2016-08-20 DIAGNOSIS — I129 Hypertensive chronic kidney disease with stage 1 through stage 4 chronic kidney disease, or unspecified chronic kidney disease: Secondary | ICD-10-CM | POA: Diagnosis not present

## 2016-08-20 DIAGNOSIS — Z79899 Other long term (current) drug therapy: Secondary | ICD-10-CM | POA: Insufficient documentation

## 2016-08-20 DIAGNOSIS — I2510 Atherosclerotic heart disease of native coronary artery without angina pectoris: Secondary | ICD-10-CM | POA: Insufficient documentation

## 2016-08-20 DIAGNOSIS — R079 Chest pain, unspecified: Secondary | ICD-10-CM | POA: Diagnosis not present

## 2016-08-20 LAB — BASIC METABOLIC PANEL
Anion gap: 9 (ref 5–15)
BUN: 26 mg/dL — ABNORMAL HIGH (ref 6–20)
CO2: 31 mmol/L (ref 22–32)
Calcium: 9.3 mg/dL (ref 8.9–10.3)
Chloride: 99 mmol/L — ABNORMAL LOW (ref 101–111)
Creatinine, Ser: 1.26 mg/dL — ABNORMAL HIGH (ref 0.44–1.00)
GFR calc Af Amer: 46 mL/min — ABNORMAL LOW (ref >60)
GFR calc non Af Amer: 40 mL/min — ABNORMAL LOW (ref >60)
Glucose, Bld: 101 mg/dL — ABNORMAL HIGH (ref 65–99)
Potassium: 4.3 mmol/L (ref 3.5–5.1)
Sodium: 139 mmol/L (ref 135–145)

## 2016-08-20 LAB — CBC WITH DIFFERENTIAL/PLATELET
Band Neutrophils: 0 %
Basophils Absolute: 0 10*3/uL (ref 0.0–0.1)
Basophils Relative: 0 %
Eosinophils Absolute: 0.1 10*3/uL (ref 0.0–0.7)
Eosinophils Relative: 1 %
HCT: 43 % (ref 36.0–46.0)
Hemoglobin: 14 g/dL (ref 12.0–15.0)
Lymphocytes Relative: 33 %
Lymphs Abs: 2.4 10*3/uL (ref 0.7–4.0)
MCH: 29.6 pg (ref 26.0–34.0)
MCHC: 32.6 g/dL (ref 30.0–36.0)
MCV: 90.9 fL (ref 78.0–100.0)
Monocytes Absolute: 1 10*3/uL (ref 0.1–1.0)
Monocytes Relative: 10 %
Neutro Abs: 4 10*3/uL (ref 1.7–7.7)
Neutrophils Relative %: 56 %
Platelets: 202 10*3/uL (ref 150–400)
RBC: 4.73 MIL/uL (ref 3.87–5.11)
RDW: 13.7 % (ref 11.5–15.5)
WBC: 7.2 10*3/uL (ref 4.0–10.5)

## 2016-08-20 LAB — TROPONIN I
Troponin I: <0.03 ng/mL (ref <0.03)
Troponin I: <0.03 ng/mL (ref <0.03)

## 2016-08-20 NOTE — ED Triage Notes (Addendum)
Pt reports left shoulder pain that radiates into her back and underneath her left arm. Pt denies chest pain or any other symptoms.

## 2016-08-20 NOTE — ED Notes (Signed)
Pt alert & oriented x4, stable gait. Patient given discharge instructions, paperwork & prescription(s). Patient  instructed to stop at the registration desk to finish any additional paperwork. Patient verbalized understanding. Pt left department w/ no further questions. 

## 2016-08-22 ENCOUNTER — Encounter (HOSPITAL_COMMUNITY): Payer: Self-pay | Admitting: *Deleted

## 2016-08-22 ENCOUNTER — Encounter (HOSPITAL_COMMUNITY)
Admission: RE | Disposition: A | Discharge status: Home / Self Care | Payer: Self-pay | Source: Ambulatory Visit | Attending: Internal Medicine

## 2016-08-22 ENCOUNTER — Ambulatory Visit (HOSPITAL_COMMUNITY)
Admission: RE | Admit: 2016-08-22 | Discharge: 2016-08-22 | Disposition: A | Discharge status: Home / Self Care | Payer: PPO | Source: Ambulatory Visit | Attending: Internal Medicine | Admitting: Internal Medicine

## 2016-08-22 DIAGNOSIS — Z86718 Personal history of other venous thrombosis and embolism: Secondary | ICD-10-CM | POA: Insufficient documentation

## 2016-08-22 DIAGNOSIS — K640 First degree hemorrhoids: Secondary | ICD-10-CM | POA: Diagnosis not present

## 2016-08-22 DIAGNOSIS — I129 Hypertensive chronic kidney disease with stage 1 through stage 4 chronic kidney disease, or unspecified chronic kidney disease: Secondary | ICD-10-CM | POA: Insufficient documentation

## 2016-08-22 DIAGNOSIS — I4891 Unspecified atrial fibrillation: Secondary | ICD-10-CM | POA: Diagnosis not present

## 2016-08-22 DIAGNOSIS — Z79899 Other long term (current) drug therapy: Secondary | ICD-10-CM | POA: Diagnosis not present

## 2016-08-22 DIAGNOSIS — I2510 Atherosclerotic heart disease of native coronary artery without angina pectoris: Secondary | ICD-10-CM | POA: Insufficient documentation

## 2016-08-22 DIAGNOSIS — N183 Chronic kidney disease, stage 3 (moderate): Secondary | ICD-10-CM | POA: Diagnosis not present

## 2016-08-22 DIAGNOSIS — Z951 Presence of aortocoronary bypass graft: Secondary | ICD-10-CM | POA: Diagnosis not present

## 2016-08-22 DIAGNOSIS — Z7901 Long term (current) use of anticoagulants: Secondary | ICD-10-CM | POA: Insufficient documentation

## 2016-08-22 DIAGNOSIS — K5730 Diverticulosis of large intestine without perforation or abscess without bleeding: Secondary | ICD-10-CM | POA: Diagnosis not present

## 2016-08-22 DIAGNOSIS — I739 Peripheral vascular disease, unspecified: Secondary | ICD-10-CM | POA: Insufficient documentation

## 2016-08-22 DIAGNOSIS — K625 Hemorrhage of anus and rectum: Secondary | ICD-10-CM

## 2016-08-22 DIAGNOSIS — K921 Melena: Secondary | ICD-10-CM | POA: Diagnosis not present

## 2016-08-22 HISTORY — PX: COLONOSCOPY: SHX5424

## 2016-08-22 SURGERY — COLONOSCOPY
Anesthesia: Moderate Sedation

## 2016-08-22 MED ORDER — MEPERIDINE HCL 100 MG/ML IJ SOLN
INTRAMUSCULAR | Status: DC | PRN
  Administered 2016-08-22: 25 mg via INTRAVENOUS
  Administered 2016-08-22: 50 mg via INTRAVENOUS

## 2016-08-22 MED ORDER — ONDANSETRON HCL 4 MG/2ML IJ SOLN
INTRAMUSCULAR | Status: AC
  Filled 2016-08-22: qty 2

## 2016-08-22 MED ORDER — MIDAZOLAM HCL 5 MG/5ML IJ SOLN
INTRAMUSCULAR | Status: AC
  Filled 2016-08-22: qty 10

## 2016-08-22 MED ORDER — ONDANSETRON HCL 4 MG/2ML IJ SOLN
INTRAMUSCULAR | Status: DC | PRN
  Administered 2016-08-22: 4 mg via INTRAVENOUS

## 2016-08-22 MED ORDER — MIDAZOLAM HCL 5 MG/5ML IJ SOLN
INTRAMUSCULAR | Status: DC | PRN
  Administered 2016-08-22: 1 mg via INTRAVENOUS
  Administered 2016-08-22: 2 mg via INTRAVENOUS

## 2016-08-22 MED ORDER — SIMETHICONE 40 MG/0.6ML PO SUSP
ORAL | Status: DC | PRN
  Administered 2016-08-22: 14:00:00

## 2016-08-22 MED ORDER — MEPERIDINE HCL 100 MG/ML IJ SOLN
INTRAMUSCULAR | Status: AC
  Filled 2016-08-22: qty 2

## 2016-08-22 MED ORDER — SODIUM CHLORIDE 0.9 % IV SOLN: INTRAVENOUS | Status: DC

## 2016-08-22 NOTE — H&P (View-Only) (Signed)
Primary Care Physician:  Purvis Kilts, MD Primary Gastroenterologist:  Dr. Gala Romney   Chief Complaint  Patient presents with  . Rectal Bleeding    bright red, last tcs 2015  . Abdominal Pain    right lower    HPI:   Sandra Bradford is a 76 y.o. female presenting today with history of diverticulitis in 2015, last colonoscopy in 2015 without polyps. Last seen in 2015. Here for rectal bleeding. She is on Xarelto for afib, which has been held since Monday, so a total of 4 days as of today and to be off no longer than 7 days per her cardiologist.   Painless hematochezia, onset 2 weeks ago. Six total episodes of rectal bleeding. Went to bathroom, had a BM and denied constipation, and noted a lot of blood. The next morning had some. Some BMs without blood, some with blood. Bleeding started about 2 weeks ago. States she hasn't strained at all. Last episode of bleeding was last night.   Chronic RLQ discomfort, mild. States she will bump something and it will hurt. Otherwise, no issues. Had hysterectomy but ovaries remain.   Outside labs from April 23 with Hgb 13.6.     Past Medical History:  Diagnosis Date  . A-fib ([place])   . Abnormal nuclear cardiac imaging test 05/01/2014  . Arthritis    "scattered" (06/10/2014)  . Carotid arterial disease ([place])   . Chronic bronchitis ([place])    "several times; haven't had it since I quit working in 2008" (06/10/2014)  . Chronic kidney disease, stage 3   . Coronary artery disease   . Diverticulitis   . DVT (deep venous thrombosis) ([place]) 2001   "related to sitting around when I broke my foot"  . Heart murmur   . History of blood transfusion 01/2001   "related to OHS"   . Hyperlipemia   . PAD (peripheral artery disease) ([place])   . Pneumonia ~ 2010 X 2  . PONV (postoperative nausea and vomiting)   . 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 Systemic hypertension     Past Surgical History:  Procedure Laterality Date  . CARDIAC  CATHETERIZATION  2002; 2005  . CARDIOVERSION N/A 05/08/2014   Procedure: CARDIOVERSION;  Surgeon: Sanda Klein, MD;  Location: [facility];  Service: Cardiovascular;  Laterality: N/A;  . CHOLECYSTECTOMY N/A 01/17/2013   Procedure: LAPAROSCOPIC CHOLECYSTECTOMY;  Surgeon: Donato Heinz, MD;  Location: [facility];  Service: General;  Laterality: N/A;  . COLONOSCOPY  July 2007   Dr. Oneida Alar: ascending and sigmoid colon diverticula  . COLONOSCOPY  Dec 2011   Dr. Oneida Alar: ischemic colitis. CTA performed with patent celiac, SMA, and IMA  . COLONOSCOPY N/A 08/06/2013   Dr. Gala Romney: colonic diverticulosis, internal hemorrhoids   . CORONARY ARTERY BYPASS GRAFT  01/2001   LIMA TO LAD,SVG TO DIAGONAL,SVG TO MARGINAL,SVG SEQUENTALLY TO THE PD & PL VESSEL  . KNEE ARTHROSCOPY Right ~ 1985  . LEFT HEART CATHETERIZATION WITH CORONARY/GRAFT ANGIOGRAM N/A 06/11/2014   Procedure: LEFT HEART CATHETERIZATION WITH Beatrix Fetters;  Surgeon: Peter M Martinique, MD;  Location: [facility] C[facility] LAB;  Service: Cardiovascular;  Laterality: N/A;  . MOHS SURGERY  ~ 04/2013   upper lip  . NM MYOCAR PERF WALL MOTION  08/18/2008   No significant ischemia  . TONSILLECTOMY  1953  . TUBAL LIGATION  ~ 1973  . US ECHOCARDIOGRAPHY  07/04/2008   trace MR,mild TR  . VAGINAL HYSTERECTOMY  1980    Current Outpatient Prescriptions  Medication Sig Dispense Refill  . acetaminophen (TYLENOL) 325 MG tablet Take 650 mg by mouth every 6 (six) hours as needed.    . Cholecalciferol (VITAMIN D) 2000 UNITS CAPS Take 2,000 Units by mouth daily.    . Flaxseed, Linseed, (FLAXSEED OIL) 1000 MG CAPS Take 1 capsule by mouth daily.    . furosemide (LASIX) 20 MG tablet Take 1 tablet (20 mg total) by mouth daily. 90 tablet 3  . metoprolol (LOPRESSOR) 50 MG tablet Take 1 tablet (50 mg total) by mouth 2 (two) times daily. (Patient taking differently: Take 25 mg by mouth 2 (two) times daily. ) 180 tablet 3  . Multiple Vitamin (MULITIVITAMIN WITH MINERALS) TABS  Take 1 tablet by mouth daily.    . quinapril (ACCUPRIL) 5 MG tablet Take 0.5 tablets (2.5 mg total) by mouth daily. 45 tablet 3  . Rivaroxaban (XARELTO) 15 MG TABS tablet Take 1 tablet (15 mg total) by mouth daily with supper. 90 tablet 3  . acetaminophen (TYLENOL) 500 MG tablet Take 1,000 mg by mouth daily as needed for mild pain.     . Biotin (BIOTIN MAXIMUM STRENGTH) 10 MG TABS Take 1 tablet by mouth daily.     No current facility-administered medications for this visit.     Allergies as of 08/18/2016 - Review Complete 08/18/2016  Allergen Reaction Noted  . Codeine Nausea Only 05/24/2011  . Statins Other (See Comments) 11/09/2012    Family History  Problem Relation Age of Onset  . Heart attack Mother   . Heart attack Father   . Congestive Heart Failure Father   . Colon cancer Neg Hx     Social History   Social History  . Marital status: Divorced    Spouse name: N/A  . Number of children: N/A  . Years of education: N/A   Occupational History  . Not on file.   Social History Main Topics  . Smoking status: Never Smoker  . Smokeless tobacco: Never Used  . Alcohol use Yes     Comment: 06/10/2014 "might have a beer after mowing yard or a drink at holidays"  . Drug use: No  . Sexual activity: No   Other Topics Concern  . Not on file   Social History Narrative  . No narrative on file    Review of Systems: Gen: Denies any fever, chills, fatigue, weight loss, lack of appetite.  CV: Denies chest pain, heart palpitations, peripheral edema, syncope.  Resp: Denies shortness of breath at rest or with exertion. Denies wheezing or cough.  GI: see HPI  GU : Denies urinary burning, urinary frequency, urinary hesitancy MS: Denies joint pain, muscle weakness, cramps, or limitation of movement.  Derm: Denies rash, itching, dry skin Psych: Denies depression, anxiety, memory loss, and confusion Heme: see HPI   Physical Exam: BP (!) 146/86   Pulse (!) 102   Temp 97 F (36.1  C) (Oral)   Ht 5' 2.25" (1.581 m)   Wt 179 lb (81.2 kg)   LMP 04/25/1978   BMI 32.48 kg/m  General:   Alert and oriented. Pleasant and cooperative. Well-nourished and well-developed.  Head:  Normocephalic and atraumatic. Eyes:  Without icterus, sclera clear and conjunctiva pink.  Ears:  Normal auditory acuity. Nose:  No deformity, discharge,  or lesions. Mouth:  No deformity or lesions, oral mucosa pink.  Lungs:  Clear to auscultation bilaterally. No wheezes, rales, or rhonchi. No distress.  Heart:  S1, S2 present, irregularly irregular Abdomen:  +BS, soft, non-tender and non-distended. No HSM noted. No guarding or rebound. No masses appreciated.  Rectal:  No obvious fissure, no thrombosed hemorrhoids, internal exam with possible posterior internal hemorrhoid, no evidence of gross bleeding, stool brown  Msk:  Symmetrical without gross deformities. Normal posture. Extremities:  Without edema. Neurologic:  Alert and  oriented x4;  grossly normal neurologically. Psych:  Alert and cooperative. Normal mood and affect.

## 2016-08-22 NOTE — Op Note (Signed)
[facility] Patient Name: Sandra Bradford Procedure Date: 08/22/2016 1:27 PM MRN: [id number] Date of Birth: 1941/02/02 Attending MD: Norvel Richards , MD CSN: [id number] Age: 76 Admit Type: Outpatient Procedure:                Colonoscopy - diagnostic Indications:              Hematochezia Providers:                Norvel Richards, MD, Jeanann Lewandowsky. Gwenlyn Perking RN, RN,                            Aram Candela Referring MD:              Medicines:                Midazolam 3 mg IV, Meperidine 75 mg IV, Ondansetron                            4 mg IV Complications:            No immediate complications. Estimated Blood Loss:     Estimated blood loss: none. Estimated blood loss:                            none. Procedure:                Pre-Anesthesia Assessment:                           - Prior to the procedure, a History and Physical                            was performed, and patient medications and                            allergies were reviewed. The patient's tolerance of                            previous anesthesia was also reviewed. The risks                            and benefits of the procedure and the sedation                            options and risks were discussed with the patient.                            All questions were answered, and informed consent                            was obtained. Prior Anticoagulants: The patient has                            taken no previous anticoagulant or antiplatelet                            agents.  ASA Grade Assessment: III - A patient with                            severe systemic disease. After reviewing the risks                            and benefits, the patient was deemed in                            satisfactory condition to undergo the procedure.                           After obtaining informed consent, the colonoscope                            was passed under direct vision. Throughout the             procedure, the patient's blood pressure, pulse, and                            oxygen saturations were monitored continuously. The                            EC-[id number] ([id number]) scope was introduced through                            the anus and advanced to the the cecum, identified                            by appendiceal orifice and ileocecal valve. The                            ileocecal valve, appendiceal orifice, and rectum                            were photographed. The entire colon was well                            visualized. The ileocecal valve, appendiceal                            orifice, and rectum were photographed. The                            colonoscopy was performed without difficulty. The                            patient tolerated the procedure well. The entire                            colon was well visualized. The quality of the bowel                            preparation was adequate. Scope In: 1:51:09 PM Scope  Out: 2:04:33 PM Scope Withdrawal Time: 0 hours 7 minutes 49 seconds  Total Procedure Duration: 0 hours 13 minutes 24 seconds  Findings:      The perianal and digital rectal examinations were normal.      Non-bleeding internal hemorrhoids were found during retroflexion. The       hemorrhoids were moderate, medium-sized and Grade I (internal       hemorrhoids that do not prolapse).      Scattered small and large-mouthed diverticula were found in the sigmoid       colon.      The exam was otherwise without abnormality on direct and retroflexion       views. Impression:               - Non-bleeding internal hemorrhoids.                           - Diverticulosis in the sigmoid colon.                           - The examination was otherwise normal on direct                            and retroflexion views.                           - No specimens collected. I suspect her bleeding is                            secondary to hemorrhoids.  Recent hemoglobin 14 Moderate Sedation:      Moderate (conscious) sedation was personally administered by an       anesthesia professional. The following parameters were monitored: oxygen       saturation, heart rate, blood pressure, respiratory rate, EKG, adequacy       of pulmonary ventilation, and response to care. Total physician       intraservice time was 17 minutes. Recommendation:           - Patient has a contact number available for                            emergencies. The signs and symptoms of potential                            delayed complications were discussed with the                            patient. Return to normal activities tomorrow.                            Written discharge instructions were provided to the                            patient.                           - Resume previous diet.                           -  Continue present medications.                           - No repeat colonoscopy due to age.                           - Return to GI clinic in 8 weeks. I would resume                            Xarelto as the benefits appear to still outweigh                            the risks. Will treat hemorrhoids empirically with                            Anusol suppositories and Benefiber. If bleeding                            persists, can contemplate in office hemorrhoid                            banding at a later date. This would require a                            window off anticoagulation for 2 weeks. Procedure Code(s):        --- Professional ---                           [id number], Colonoscopy, flexible; diagnostic, including                            collection of specimen(s) by brushing or washing,                            when performed (separate procedure) Diagnosis Code(s):        --- Professional ---                           K64.0, First degree hemorrhoids                           K92.1, Melena (includes Hematochezia)                            K57.30, Diverticulosis of large intestine without                            perforation or abscess without bleeding CPT copyright 2016 [employer]. All rights reserved. The codes documented in this report are preliminary and upon coder review may  be revised to meet current compliance requirements. Cristopher Estimable. Ruthene Methvin, MD Norvel Richards, MD 08/22/2016 2:20:16 PM This report has been signed electronically. Number of Addenda: 0

## 2016-08-22 NOTE — Interval H&P Note (Signed)
History and Physical Interval Note:  08/22/2016 1:33 PM  Sandra Bradford  has presented today for surgery, with the diagnosis of rectal bleeding  The various methods of treatment have been discussed with the patient and family. After consideration of risks, benefits and other options for treatment, the patient has consented to  Procedure(s) with comments: COLONOSCOPY (N/A) - 130  as a surgical intervention .  The patient's history has been reviewed, patient examined, no change in status, stable for surgery.  I have reviewed the patient's chart and labs.  Questions were answered to the patient's satisfaction.     Sandra Bradford  No change. Diagnostic colonoscopy per plan.  The risks, benefits, limitations, alternatives and imponderables have been reviewed with the patient. Questions have been answered. All parties are agreeable.

## 2016-08-22 NOTE — Discharge Instructions (Signed)
Colonoscopy Discharge Instructions  Read the instructions outlined below and refer to this sheet in the next few weeks. These discharge instructions provide you with general information on caring for yourself after you leave the hospital. Your doctor may also give you specific instructions. While your treatment has been planned according to the most current medical practices available, unavoidable complications occasionally occur. If you have any problems or questions after discharge, call Dr. Gala Romney at [phone]. ACTIVITY  You may resume your regular activity, but move at a slower pace for the next 24 hours.   Take frequent rest periods for the next 24 hours.   Walking will help get rid of the air and reduce the bloated feeling in your belly (abdomen).   No driving for 24 hours (because of the medicine (anesthesia) used during the test).    Do not sign any important legal documents or operate any machinery for 24 hours (because of the anesthesia used during the test).  NUTRITION  Drink plenty of fluids.   You may resume your normal diet as instructed by your doctor.   Begin with a light meal and progress to your normal diet. Heavy or fried foods are harder to digest and may make you feel sick to your stomach (nauseated).   Avoid alcoholic beverages for 24 hours or as instructed.  MEDICATIONS  You may resume your normal medications unless your doctor tells you otherwise.  WHAT YOU CAN EXPECT TODAY  Some feelings of bloating in the abdomen.   Passage of more gas than usual.   Spotting of blood in your stool or on the toilet paper.  IF YOU HAD POLYPS REMOVED DURING THE COLONOSCOPY:  No aspirin products for 7 days or as instructed.   No alcohol for 7 days or as instructed.   Eat a soft diet for the next 24 hours.  FINDING OUT THE RESULTS OF YOUR TEST Not all test results are available during your visit. If your test results are not back during the visit, make an appointment  with your caregiver to find out the results. Do not assume everything is normal if you have not heard from your caregiver or the medical facility. It is important for you to follow up on all of your test results.  SEEK IMMEDIATE MEDICAL ATTENTION IF:  You have more than a spotting of blood in your stool.   Your belly is swollen (abdominal distention).   You are nauseated or vomiting.   You have a temperature over 101.   You have abdominal pain or discomfort that is severe or gets worse throughout the day.    Hemorrhoid and diverticulosis information provided  Begin Benefiber 1 teaspoon twice daily  2 week course of Anusol suppositories 1 per rectum at bedtime  I would continue taking Xarelto daily as the benefits outweigh the risk at this time.  Office visit with [place] in 8 weeks  If you continue to bleed, we can discuss placing hands on your hemorrhoids in the office  Hemorrhoid banding pamphlet provided   Diverticulosis Diverticulosis is a condition that develops when small pouches (diverticula) form in the wall of the large intestine (colon). The colon is where water is absorbed and stool is formed. The pouches form when the inside layer of the colon pushes through weak spots in the outer layers of the colon. You may have a few pouches or many of them. What are the causes? The cause of this condition is not known. What increases the risk?  The following factors may make you more likely to develop this condition: Being older than age 35. Your risk for this condition increases with age. Diverticulosis is rare among people younger than age 33. By age 74, many people have it. Eating a low-fiber diet. Having frequent constipation. Being overweight. Not getting enough exercise. Smoking. Taking over-the-counter pain medicines, like aspirin and ibuprofen. Having a family history of diverticulosis. What are the signs or symptoms? In most people, there are no symptoms of this  condition. If you do have symptoms, they may include: Bloating. Cramps in the abdomen. Constipation or diarrhea. Pain in the lower left side of the abdomen. How is this diagnosed? This condition is most often diagnosed during an exam for other colon problems. Because diverticulosis usually has no symptoms, it often cannot be diagnosed independently. This condition may be diagnosed by: Using a flexible scope to examine the colon (colonoscopy). Taking an X-ray of the colon after dye has been put into the colon (barium enema). Doing a CT scan. How is this treated? You may not need treatment for this condition if you have never developed an infection related to diverticulosis. If you have had an infection before, treatment may include: Eating a high-fiber diet. This may include eating more fruits, vegetables, and grains. Taking a fiber supplement. Taking a live bacteria supplement (probiotic). Taking medicine to relax your colon. Taking antibiotic medicines. Follow these instructions at home: Drink 6-8 glasses of water or more each day to prevent constipation. Try not to strain when you have a bowel movement. If you have had an infection before: Eat more fiber as directed by your health care provider or your diet and nutrition specialist (dietitian). Take a fiber supplement or probiotic, if your health care provider approves. Take over-the-counter and prescription medicines only as told by your health care provider. If you were prescribed an antibiotic, take it as told by your health care provider. Do not stop taking the antibiotic even if you start to feel better. Keep all follow-up visits as told by your health care provider. This is important. Contact a health care provider if: You have pain in your abdomen. You have bloating. You have cramps. You have not had a bowel movement in 3 days. Get help right away if: Your pain gets worse. Your bloating becomes very bad. You have a fever or  chills, and your symptoms suddenly get worse. You vomit. You have bowel movements that are bloody or black. You have bleeding from your rectum. Summary Diverticulosis is a condition that develops when small pouches (diverticula) form in the wall of the large intestine (colon). You may have a few pouches or many of them. This condition is most often diagnosed during an exam for other colon problems. If you have had an infection related to diverticulosis, treatment may include increasing the fiber in your diet, taking supplements, or taking medicines. This information is not intended to replace advice given to you by your health care provider. Make sure you discuss any questions you have with your health care provider. Document Released: 01/07/2004 Document Revised: 02/29/2016 Document Reviewed: 02/29/2016 Elsevier Interactive Patient Education  2017 [place].  Hemorrhoids Hemorrhoids are swollen veins in and around the rectum or anus. There are two types of hemorrhoids:  Internal hemorrhoids. These occur in the veins that are just inside the rectum. They may poke through to the outside and become irritated and painful.  External hemorrhoids. These occur in the veins that are outside of the anus  and can be felt as a painful swelling or hard lump near the anus. Most hemorrhoids do not cause serious problems, and they can be managed with home treatments such as diet and lifestyle changes. If home treatments do not help your symptoms, procedures can be done to shrink or remove the hemorrhoids. What are the causes? This condition is caused by increased pressure in the anal area. This pressure may result from various things, including:  Constipation.  Straining to have a bowel movement.  Diarrhea.  Pregnancy.  Obesity.  Sitting for long periods of time.  Heavy lifting or other activity that causes you to strain.  Anal sex. What are the signs or symptoms? Symptoms of this  condition include:  Pain.  Anal itching or irritation.  Rectal bleeding.  Leakage of stool (feces).  Anal swelling.  One or more lumps around the anus. How is this diagnosed? This condition can often be diagnosed through a visual exam. Other exams or tests may also be done, such as:  Examination of the rectal area with a gloved hand (digital rectal exam).  Examination of the anal canal using a small tube (anoscope).  A blood test, if you have lost a significant amount of blood.  A test to look inside the colon (sigmoidoscopy or colonoscopy). How is this treated? This condition can usually be treated at home. However, various procedures may be done if dietary changes, lifestyle changes, and other home treatments do not help your symptoms. These procedures can help make the hemorrhoids smaller or remove them completely. Some of these procedures involve surgery, and others do not. Common procedures include:  Rubber band ligation. Rubber bands are placed at the base of the hemorrhoids to cut off the blood supply to them.  Sclerotherapy. Medicine is injected into the hemorrhoids to shrink them.  Infrared coagulation. A type of light energy is used to get rid of the hemorrhoids.  Hemorrhoidectomy surgery. The hemorrhoids are surgically removed, and the veins that supply them are tied off.  Stapled hemorrhoidopexy surgery. A circular stapling device is used to remove the hemorrhoids and use staples to cut off the blood supply to them. Follow these instructions at home: Eating and drinking   Eat foods that have a lot of fiber in them, such as whole grains, beans, nuts, fruits, and vegetables. Ask your health care provider about taking products that have added fiber (fiber supplements).  Drink enough fluid to keep your urine clear or pale yellow. Managing pain and swelling   Take warm sitz baths for 20 minutes, 3-4 times a day to ease pain and discomfort.  If directed, apply ice  to the affected area. Using ice packs between sitz baths may be helpful.  Put ice in a plastic bag.  Place a towel between your skin and the bag.  Leave the ice on for 20 minutes, 2-3 times a day. General instructions   Take over-the-counter and prescription medicines only as told by your health care provider.  Use medicated creams or suppositories as told.  Exercise regularly.  Go to the bathroom when you have the urge to have a bowel movement. Do not wait.  Avoid straining to have bowel movements.  Keep the anal area dry and clean. Use wet toilet paper or moist towelettes after a bowel movement.  Do not sit on the toilet for long periods of time. This increases blood pooling and pain. Contact a health care provider if:  You have increasing pain and swelling that are  not controlled by treatment or medicine.  You have uncontrolled bleeding.  You have difficulty having a bowel movement, or you are unable to have a bowel movement.  You have pain or inflammation outside the area of the hemorrhoids. This information is not intended to replace advice given to you by your health care provider. Make sure you discuss any questions you have with your health care provider. Document Released: 04/08/2000 Document Revised: 09/09/2015 Document Reviewed: 12/24/2014 Elsevier Interactive Patient Education  2017 [employer].

## 2016-08-23 DIAGNOSIS — T814XXA Infection following a procedure, initial encounter: Secondary | ICD-10-CM | POA: Diagnosis not present

## 2016-08-25 NOTE — ED Provider Notes (Signed)
[facility] DEPT Provider Note   CSN: [id number] Arrival date & time: 08/20/16  1904     History   Chief Complaint Chief Complaint  Patient presents with  . Shoulder Pain    HPI Sandra Bradford is a 76 y.o. female.  HPI   76 year old female with left shoulder pain. Symptom onset a couple days ago. Denies any acute trauma. Pain is constant. Sometimes worse with certain movements. Radiates into her armpit and her left upper back. No numbness or tingling. No fevers or chills. No dyspnea or other respiratory complaints.  Past Medical History:  Diagnosis Date  . A-fib ([place])   . Abnormal nuclear cardiac imaging test 05/01/2014  . Arthritis    "scattered" (06/10/2014)  . Carotid arterial disease ([place])   . Chronic bronchitis ([place])    "several times; haven't had it since I quit working in 2008" (06/10/2014)  . Chronic kidney disease, stage 3   . Coronary artery disease   . Diverticulitis   . DVT (deep venous thrombosis) ([place]) 2001   "related to sitting around when I broke my foot"  . Heart murmur   . History of blood transfusion 01/2001   "related to OHS"   . Hyperlipemia   . PAD (peripheral artery disease) ([place])   . Pneumonia ~ 2010 X 2  . PONV (postoperative nausea and vomiting)   . 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 Systemic hypertension     Patient Active Problem List   Diagnosis Date Noted  . Rectal bleeding 08/18/2016  . PAF (paroxysmal atrial fibrillation) ([place]) 06/11/2014  . Chronic anticoagulation 06/11/2014  . Acute on chronic renal insufficiency 06/10/2014  . Persistent atrial fibrillation ([place])   . Abnormal nuclear cardiac imaging test 05/01/2014  . Dyspnea on exertion 03/03/2014  . Chronic kidney disease, stage 3 02/10/2014  . Diverticulitis of colon (without mention of hemorrhage)(562.11) 07/16/2013  . Atrial fibrillation ([place]) 06/21/2013  . Systolic murmur 35/70/1779  . Preoperative cardiovascular examination 01/15/2013  . 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 PAD (peripheral artery disease) ([place]) 11/10/2012  . Hyperlipidemia 11/10/2012  . Statin intolerance 11/10/2012  . HTN (hypertension) 11/10/2012  . Obesity (BMI 30.0-34.9) 11/10/2012  . Venous insufficiency, peripheral 11/10/2012    Past Surgical History:  Procedure Laterality Date  . CARDIAC CATHETERIZATION  2002; 2005  . CARDIOVERSION N/A 05/08/2014   Procedure: CARDIOVERSION;  Surgeon: Sanda Klein, MD;  Location: [facility] ENDOSCOPY;  Service: Cardiovascular;  Laterality: N/A;  . CHOLECYSTECTOMY N/A 01/17/2013   Procedure: LAPAROSCOPIC CHOLECYSTECTOMY;  Surgeon: Donato Heinz, MD;  Location: [facility];  Service: General;  Laterality: N/A;  . COLONOSCOPY  July 2007   Dr. Oneida Alar: ascending and sigmoid colon diverticula  . COLONOSCOPY  Dec 2011   Dr. Oneida Alar: ischemic colitis. CTA performed with patent celiac, SMA, and IMA  . COLONOSCOPY N/A 08/06/2013   Dr. Gala Romney: colonic diverticulosis, internal hemorrhoids   . CORONARY ARTERY BYPASS GRAFT  01/2001   LIMA TO LAD,SVG TO DIAGONAL,SVG TO MARGINAL,SVG SEQUENTALLY TO THE PD & PL VESSEL  . KNEE ARTHROSCOPY Right ~ 1985  . LEFT HEART CATHETERIZATION WITH CORONARY/GRAFT ANGIOGRAM N/A 06/11/2014   Procedure: LEFT HEART CATHETERIZATION WITH Beatrix Fetters;  Surgeon: Peter M Martinique, MD;  Location: [facility] CATH LAB;  Service: Cardiovascular;  Laterality: N/A;  . MOHS SURGERY  ~ 04/2013   upper lip  . NM MYOCAR PERF WALL MOTION  08/18/2008   No significant ischemia  . TONSILLECTOMY  1953  .  TUBAL LIGATION  ~ 1973  . US ECHOCARDIOGRAPHY  07/04/2008   trace MR,mild TR  . VAGINAL HYSTERECTOMY  1980    OB History    No data available       Home Medications    Prior to Admission medications   Medication Sig Start Date End Date Taking? Authorizing Provider  acetaminophen (TYLENOL) 325 MG tablet Take 650 mg by mouth every 6 (six) hours as needed for mild pain, moderate pain, fever or headache.    Yes Historical  Provider, MD  cholecalciferol (VITAMIN D) 1000 units tablet Take 1,000 Units by mouth 2 (two) times daily.   Yes Historical Provider, MD  furosemide (LASIX) 20 MG tablet Take 1 tablet (20 mg total) by mouth daily. 05/23/16  Yes Mihai Croitoru, MD  metoprolol (LOPRESSOR) 50 MG tablet Take 1 tablet (50 mg total) by mouth 2 (two) times daily. Patient taking differently: Take 25 mg by mouth 2 (two) times daily.  05/23/16  Yes Mihai Croitoru, MD  Multiple Vitamin (MULITIVITAMIN WITH MINERALS) TABS Take 1 tablet by mouth daily.   Yes Historical Provider, MD  quinapril (ACCUPRIL) 5 MG tablet Take 0.5 tablets (2.5 mg total) by mouth daily. 05/23/16  Yes Mihai Croitoru, MD  Rivaroxaban (XARELTO) 15 MG TABS tablet Take 1 tablet (15 mg total) by mouth daily with supper. 05/23/16  Yes Mihai Croitoru, MD  hydrocortisone (PROCTOZONE-HC) 2.5 % rectal cream Place 1 application rectally 2 (two) times daily. 08/18/16   Annitta Needs, NP  polyethylene glycol-electrolytes (TRILYTE) 420 g solution Take 4,000 mLs by mouth as directed. 08/18/16   Daneil Dolin, MD    Family History Family History  Problem Relation Age of Onset  . Heart attack Mother   . Heart attack Father   . Congestive Heart Failure Father   . Colon cancer Neg Hx     Social History Social History  Substance Use Topics  . Smoking status: Never Smoker  . Smokeless tobacco: Never Used  . Alcohol use Yes     Comment: 06/10/2014 "might have a beer after mowing yard or a drink at holidays"     Allergies   Codeine and Statins   Review of Systems Review of Systems  All systems reviewed and negative, other than as noted in HPI.  Physical Exam Updated Vital Signs BP (!) 149/83 (BP Location: Left Arm)   Pulse 87   Temp 98.1 F (36.7 C) (Oral)   Resp 18   LMP 04/25/1978   SpO2 98%   Physical Exam  Constitutional: She appears well-developed and well-nourished. No distress.  HENT:  Head: Normocephalic and atraumatic.  Eyes: Conjunctivae  are normal. Right eye exhibits no discharge. Left eye exhibits no discharge.  Neck: Neck supple.  Cardiovascular: Normal rate, regular rhythm and normal heart sounds.  Exam reveals no gallop and no friction rub.   No murmur heard. Pulmonary/Chest: Effort normal and breath sounds normal. No respiratory distress.  Abdominal: Soft. She exhibits no distension. There is no tenderness.  Musculoskeletal: She exhibits no edema or tenderness.  Left shoulder grossly normal in appearance and symmetric as compared to the right. Mild tenderness lateral and posterior shoulder. No concerning skin changes. Neurovascularly intact.  Neurological: She is alert.  Skin: Skin is warm and dry.  Psychiatric: She has a normal mood and affect. Her behavior is normal. Thought content normal.  Nursing note and vitals reviewed.    ED Treatments / Results  Labs (all labs ordered are listed, but only  abnormal results are displayed) Labs Reviewed  BASIC METABOLIC PANEL - Abnormal; Notable for the following:       Result Value   Chloride 99 (*)    Glucose, Bld 101 (*)    BUN 26 (*)    Creatinine, Ser 1.26 (*)    GFR calc non Af Amer 40 (*)    GFR calc Af Amer 46 (*)    All other components within normal limits  TROPONIN I  CBC WITH DIFFERENTIAL/PLATELET  TROPONIN I    EKG  EKG Interpretation  Date/Time:  Saturday August 20 2016 19:41:52 EDT Ventricular Rate:  83 PR Interval:    QRS Duration: 93 QT Interval:  369 QTC Calculation: 434 R Axis:   66 Text Interpretation:  Atrial fibrillation Borderline T wave abnormalities Baseline wander in lead(s) I III aVL Confirmed by Wilson Singer  MD, Milessa Hogan ([id number]) on 08/20/2016 11:13:20 PM       Radiology No results found.   Dg Chest 2 View  Result Date: 08/20/2016 CLINICAL DATA:  Pt reports left shoulder pain taht radiates into her back and underneath her left arm. Pt denies chest pain or any other symptomsHISTORY OF CAD, HTN, PAD, CANCER, CARDIAC CATH EXAM: CHEST  2  VIEW COMPARISON:  06/10/2013 FINDINGS: Changes from CABG surgery are stable. The cardiac silhouette is normal in size. No mediastinal or hilar masses. No evidence of adenopathy. Clear lungs.  No pleural effusion.  No pneumothorax. Skeletal structures are demineralized but intact. IMPRESSION: No acute cardiopulmonary disease. Electronically Signed   By: Lajean Manes M.D.   On: 08/20/2016 20:51    Procedures Procedures (including critical care time)  Medications Ordered in ED Medications - No data to display   Initial Impression / Assessment and Plan / ED Course  I have reviewed the triage vital signs and the nursing notes.  Pertinent labs & imaging results that were available during my care of the patient were reviewed by me and considered in my medical decision making (see chart for details).     76 year old female with left shoulder pain. Suspect musculoskeletal. Doubt ACS. Atypical symptoms. Patient without acute abnormality. Plans pneumatic treatment. Return precautions discussed.  Final Clinical Impressions(s) / ED Diagnoses   Final diagnoses:  Left shoulder pain, unspecified chronicity    New Prescriptions Discharge Medication List as of 08/20/2016 11:12 PM       Virgel Manifold, MD 08/25/16 1513

## 2016-08-29 ENCOUNTER — Encounter (HOSPITAL_COMMUNITY): Payer: Self-pay | Admitting: Internal Medicine

## 2016-09-01 DIAGNOSIS — S81802D Unspecified open wound, left lower leg, subsequent encounter: Secondary | ICD-10-CM | POA: Diagnosis not present

## 2016-09-01 DIAGNOSIS — R600 Localized edema: Secondary | ICD-10-CM | POA: Diagnosis not present

## 2016-09-13 DIAGNOSIS — S81802A Unspecified open wound, left lower leg, initial encounter: Secondary | ICD-10-CM | POA: Diagnosis not present

## 2016-09-13 DIAGNOSIS — R600 Localized edema: Secondary | ICD-10-CM | POA: Diagnosis not present

## 2016-09-16 ENCOUNTER — Emergency Department (HOSPITAL_COMMUNITY): Payer: PPO

## 2016-09-16 ENCOUNTER — Encounter (HOSPITAL_COMMUNITY): Payer: Self-pay | Admitting: Emergency Medicine

## 2016-09-16 ENCOUNTER — Emergency Department (HOSPITAL_COMMUNITY)
Admission: EM | Admit: 2016-09-16 | Discharge: 2016-09-16 | Disposition: A | Discharge status: Home / Self Care | Payer: PPO | Attending: Emergency Medicine | Admitting: Emergency Medicine

## 2016-09-16 DIAGNOSIS — N183 Chronic kidney disease, stage 3 (moderate): Secondary | ICD-10-CM | POA: Insufficient documentation

## 2016-09-16 DIAGNOSIS — I129 Hypertensive chronic kidney disease with stage 1 through stage 4 chronic kidney disease, or unspecified chronic kidney disease: Secondary | ICD-10-CM | POA: Diagnosis not present

## 2016-09-16 DIAGNOSIS — I2510 Atherosclerotic heart disease of native coronary artery without angina pectoris: Secondary | ICD-10-CM | POA: Diagnosis not present

## 2016-09-16 DIAGNOSIS — Z79899 Other long term (current) drug therapy: Secondary | ICD-10-CM | POA: Insufficient documentation

## 2016-09-16 DIAGNOSIS — J209 Acute bronchitis, unspecified: Secondary | ICD-10-CM | POA: Diagnosis not present

## 2016-09-16 DIAGNOSIS — J40 Bronchitis, not specified as acute or chronic: Secondary | ICD-10-CM

## 2016-09-16 DIAGNOSIS — R05 Cough: Secondary | ICD-10-CM | POA: Diagnosis not present

## 2016-09-16 MED ORDER — AZITHROMYCIN 250 MG PO TABS: ORAL_TABLET | ORAL | 0 refills | Status: DC

## 2016-09-16 NOTE — ED Provider Notes (Signed)
[facility] DEPT Provider Note   CSN: [id number] Arrival date & time: 09/16/16  0932  By signing my name below, I, Sandra Bradford, attest that this documentation has been prepared under the direction and in the presence of physician practitioner, Milton Ferguson, MD. Electronically Signed: Dora [place], Scribe. 09/16/2016. 10:28 AM.  History   Chief Complaint Chief Complaint  Patient presents with  . Cough   The history is provided by the patient. No language interpreter was used.  Cough  This is a new problem. The current episode started 2 days ago. The problem occurs every few minutes. The problem has not changed since onset.The cough is productive of sputum. Maximum temperature: subjective. Pertinent negatives include no chest pain and no shortness of breath. She has tried nothing for the symptoms. She is not a smoker. Her past medical history is significant for bronchitis and pneumonia. Her past medical history does not include COPD, emphysema or asthma.    HPI Comments: Sandra Bradford is a 76 y.o. female with PMHx of A-Fib on Xarelto, CAD, CKD, HLD, and chronic bronchitis who presents to the Emergency Department complaining of a persistent cough productive of yellow-tinged sputum for three days. She reports some associated nausea without vomiting and mild subjective fevers. No alleviating factors noted. No h/o DM. Patient is a non-smoker. She denies chest pain, dyspnea, or any other associated symptoms.  Past Medical History:  Diagnosis Date  . A-fib ([place])   . Abnormal nuclear cardiac imaging test 05/01/2014  . Arthritis    "scattered" (06/10/2014)  . Carotid arterial disease ([place])   . Chronic bronchitis ([place])    "several times; haven't had it since I quit working in 2008" (06/10/2014)  . Chronic kidney disease, stage 3   . Coronary artery disease   . Diverticulitis   . DVT (deep venous thrombosis) ([place]) 2001   "related to sitting around when I broke my foot"  . Heart murmur   .  History of blood transfusion 01/2001   "related to OHS"   . Hyperlipemia   . PAD (peripheral artery disease) ([place])   . Pneumonia ~ 2010 X 2  . PONV (postoperative nausea and vomiting)   . 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 Systemic hypertension     Patient Active Problem List   Diagnosis Date Noted  . Rectal bleeding 08/18/2016  . PAF (paroxysmal atrial fibrillation) ([place]) 06/11/2014  . Chronic anticoagulation 06/11/2014  . Acute on chronic renal insufficiency 06/10/2014  . Persistent atrial fibrillation ([place])   . Abnormal nuclear cardiac imaging test 05/01/2014  . Dyspnea on exertion 03/03/2014  . Chronic kidney disease, stage 3 02/10/2014  . Diverticulitis of colon (without mention of hemorrhage)(562.11) 07/16/2013  . Atrial fibrillation ([place]) 06/21/2013  . Systolic murmur 70/26/3785  . Preoperative cardiovascular examination 01/15/2013  . CAD-CABG 2002, cath 06/11/14 11/10/2012  . PAD (peripheral artery disease) ([place]) 11/10/2012  . Hyperlipidemia 11/10/2012  . Statin intolerance 11/10/2012  . HTN (hypertension) 11/10/2012  . Obesity (BMI 30.0-34.9) 11/10/2012  . Venous insufficiency, peripheral 11/10/2012    Past Surgical History:  Procedure Laterality Date  . CARDIAC CATHETERIZATION  2002; 2005  . CARDIOVERSION N/A 05/08/2014   Procedure: CARDIOVERSION;  Surgeon: Sanda Klein, MD;  Location: [facility]OSCOPY;  Service: Cardiovascular;  Laterality: N/A;  . CHOLECYSTECTOMY N/A 01/17/2013   Procedure: LAPAROSCOPIC CHOLECYSTECTOMY;  Surgeon: Donato Heinz, MD;  Location: [facility]S;  Service: General;  Laterality: N/A;  . COLONOSCOPY  July 2007  Dr. Oneida Alar: ascending and sigmoid colon diverticula  . COLONOSCOPY  Dec 2011   Dr. Oneida Alar: ischemic colitis. CTA performed with patent celiac, SMA, and IMA  . COLONOSCOPY N/A 08/06/2013   Dr. Gala Romney: colonic diverticulosis, internal hemorrhoids   . COLONOSCOPY N/A 08/22/2016   Procedure: COLONOSCOPY;  Surgeon: Daneil Dolin, MD;  Location: [facility];  Service: Endoscopy;  Laterality: N/A;  130   . CORONARY ARTERY BYPASS GRAFT  01/2001   LIMA TO LAD,SVG TO DIAGONAL,SVG TO MARGINAL,SVG SEQUENTALLY TO THE PD & PL VESSEL  . KNEE ARTHROSCOPY Right ~ 1985  . LEFT HEART CATHETERIZATION WITH CORONARY/GRAFT ANGIOGRAM N/A 06/11/2014   Procedure: LEFT HEART CATHETERIZATION WITH Beatrix Fetters;  Surgeon: Peter M Martinique, MD;  Location[facility] CATH LAB;  Service: Cardiovascular;  Laterality: N/A;  . MOHS SURGERY  ~ 04/2013   upper lip  . NM MYOCAR PERF WALL MOTION  08/18/2008   No significant ischemia  . TONSILLECTOMY  1953  . TUBAL LIGATION  ~ 1973  . US ECHOCARDIOGRAPHY  07/04/2008   trace MR,mild TR  . VAGINAL HYSTERECTOMY  1980    OB History    No data available       Home Medications    Prior to Admission medications   Medication Sig Start Date End Date Taking? Authorizing Provider  acetaminophen (TYLENOL) 325 MG tablet Take 650 mg by mouth every 6 (six) hours as needed for mild pain, moderate pain, fever or headache.     [provider]  cholecalciferol (VITAMIN D) 1000 units tablet Take 1,000 Units by mouth 2 (two) times daily.    [provider]  furosemide (LASIX) 20 MG tablet Take 1 tablet (20 mg total) by mouth daily. 05/23/16   Croitoru, Mihai, MD  hydrocortisone (PROCTOZONE-HC) 2.5 % rectal cream Place 1 application rectally 2 (two) times daily. 08/18/16   Annitta Needs, NP  metoprolol (LOPRESSOR) 50 MG tablet Take 1 tablet (50 mg total) by mouth 2 (two) times daily. Patient taking differently: Take 25 mg by mouth 2 (two) times daily.  05/23/16   Croitoru, Mihai, MD  Multiple Vitamin (MULITIVITAMIN WITH MINERALS) TABS Take 1 tablet by mouth daily.    [provider]  polyethylene glycol-electrolytes (TRILYTE) 420 g solution Take 4,000 mLs by mouth as directed. 08/18/16   Rourk, Cristopher Estimable, MD  quinapril (ACCUPRIL) 5 MG tablet Take 0.5 tablets (2.5 mg total) by mouth  daily. 05/23/16   Croitoru, Mihai, MD  Rivaroxaban (XARELTO) 15 MG TABS tablet Take 1 tablet (15 mg total) by mouth daily with supper. 05/23/16   Croitoru, Dani Gobble, MD    Family History Family History  Problem Relation Age of Onset  . Heart attack Mother   . Heart attack Father   . Congestive Heart Failure Father   . Colon cancer Neg Hx     Social History Social History  Substance Use Topics  . Smoking status: Never Smoker  . Smokeless tobacco: Never Used  . Alcohol use Yes     Comment: 06/10/2014 "might have a beer after mowing yard or a drink at holidays"     Allergies   Codeine and Statins   Review of Systems Review of Systems  Constitutional: Positive for fever.  Respiratory: Positive for cough. Negative for shortness of breath.   Cardiovascular: Negative for chest pain.  Gastrointestinal: Positive for nausea. Negative for vomiting.   Physical Exam Updated Vital Signs BP 113/76 (BP Location: Left Arm)   Pulse 87  Temp 98.2 F (36.8 C) (Oral)   Resp 18   Ht 5' 2.5" (1.588 m)   Wt 174 lb (78.9 kg)   LMP 04/25/1978   SpO2 97%   BMI 31.32 kg/m   Physical Exam  Constitutional: She is oriented to person, place, and time. She appears well-developed.  HENT:  Head: Normocephalic.  Eyes: Conjunctivae and EOM are normal. No scleral icterus.  Neck: Neck supple. No thyromegaly present.  Cardiovascular: Normal rate and regular rhythm.  Exam reveals no gallop and no friction rub.   No murmur heard. Pulmonary/Chest: Effort normal. No stridor. No respiratory distress. She has no wheezes. She exhibits no tenderness.  Crackles in the lungs.  Abdominal: She exhibits no distension. There is no tenderness. There is no rebound.  Musculoskeletal: Normal range of motion. She exhibits no edema.  Lymphadenopathy:    She has no cervical adenopathy.  Neurological: She is oriented to person, place, and time. She exhibits normal muscle tone. Coordination normal.  Skin: No rash noted.  No erythema.  Psychiatric: She has a normal mood and affect. Her behavior is normal.   ED Treatments / Results  Labs (all labs ordered are listed, but only abnormal results are displayed) Labs Reviewed - No data to display  EKG  EKG Interpretation None       Radiology No results found.  Procedures Procedures (including critical care time)  DIAGNOSTIC STUDIES: Oxygen Saturation is 97% on RA, normal by my interpretation.    COORDINATION OF CARE: 10:15 AM Discussed treatment plan with pt at bedside and pt agreed to plan.  Medications Ordered in ED Medications - No data to display   Initial Impression / Assessment and Plan / ED Course  I have reviewed the triage vital signs and the nursing notes.  Pertinent labs & imaging results that were available during my care of the patient were reviewed by me and considered in my medical decision making (see chart for details).    Patient with bronchitis. She will be treated with Z-Pak and follow-up with her PCP   Final Clinical Impressions(s) / ED Diagnoses   Final diagnoses:  None    New Prescriptions New Prescriptions   No medications on file  The chart was scribed for me under my direct supervision.  I personally performed the history, physical, and medical decision making and all procedures in the evaluation of this patient.Milton Ferguson, MD 09/16/16 [phone]

## 2016-09-16 NOTE — Discharge Instructions (Signed)
Follow-up with her doctor next week for recheck if not improving

## 2016-09-16 NOTE — ED Triage Notes (Signed)
Pt reports cough, congestion, fever, bilateral ear pain for last several days. nad noted. Pt reports generalized malaise and weakness.

## 2016-09-20 DIAGNOSIS — R600 Localized edema: Secondary | ICD-10-CM | POA: Diagnosis not present

## 2016-09-20 DIAGNOSIS — Z4801 Encounter for change or removal of surgical wound dressing: Secondary | ICD-10-CM | POA: Diagnosis not present

## 2016-09-20 DIAGNOSIS — S81802A Unspecified open wound, left lower leg, initial encounter: Secondary | ICD-10-CM | POA: Diagnosis not present

## 2016-09-27 DIAGNOSIS — S81802D Unspecified open wound, left lower leg, subsequent encounter: Secondary | ICD-10-CM | POA: Diagnosis not present

## 2016-10-04 DIAGNOSIS — R600 Localized edema: Secondary | ICD-10-CM | POA: Diagnosis not present

## 2016-10-04 DIAGNOSIS — S81802D Unspecified open wound, left lower leg, subsequent encounter: Secondary | ICD-10-CM | POA: Diagnosis not present

## 2016-10-11 DIAGNOSIS — S81002A Unspecified open wound, left knee, initial encounter: Secondary | ICD-10-CM | POA: Diagnosis not present

## 2016-10-11 DIAGNOSIS — Z4801 Encounter for change or removal of surgical wound dressing: Secondary | ICD-10-CM | POA: Diagnosis not present

## 2016-10-17 ENCOUNTER — Ambulatory Visit (INDEPENDENT_AMBULATORY_CARE_PROVIDER_SITE_OTHER): Payer: PPO | Admitting: Gastroenterology

## 2016-10-17 ENCOUNTER — Encounter: Payer: Self-pay | Admitting: Gastroenterology

## 2016-10-17 VITALS — BP 114/71 | HR 79 | Temp 98.4°F | Ht 62.0 in | Wt 176.8 lb

## 2016-10-17 DIAGNOSIS — K625 Hemorrhage of anus and rectum: Secondary | ICD-10-CM | POA: Diagnosis not present

## 2016-10-17 NOTE — Patient Instructions (Signed)
I am glad you are doing well!  I will see you back in 1 year or sooner if needed.  Call if you have any recurrent bleeding!

## 2016-10-17 NOTE — Progress Notes (Signed)
Referring Provider: Sharilyn Sites, MD Primary Care Physician:  Sharilyn Sites, MD Primary GI: Dr. Gala Romney   Chief Complaint  Patient presents with  . Diverticulitis    HPI:   Sandra Bradford is a 76 y.o. female presenting today with a history of rectal bleeding on Xarelto. Colonoscopy recently completed with non-bleeding internal hemorrhoids. Doing well today. Back on Xarelto. No further rectal bleeding. No abdominal pain. Recent hemoglobin 14. Does not want to pursue hemorrhoid banding, and this would be difficult due to anticoagulation. Would need to have a 2-week-window off therapy, which would be difficult. Patient desires to just monitor for now, which I feel is appropriate as well.   Past Medical History:  Diagnosis Date  . A-fib ([place])   . Abnormal nuclear cardiac imaging test 05/01/2014  . Arthritis    "scattered" (06/10/2014)  . Carotid arterial disease ([place])   . Chronic bronchitis ([place])    "several times; haven't had it since I quit working in 2008" (06/10/2014)  . Chronic kidney disease, stage 3   . Coronary artery disease   . Diverticulitis   . DVT (deep venous thrombosis) ([place]) 2001   "related to sitting around when I broke my foot"  . Heart murmur   . History of blood transfusion 01/2001   "related to OHS"   . Hyperlipemia   . PAD (peripheral artery disease) ([place])   . Pneumonia ~ 2010 X 2  . PONV (postoperative nausea and vomiting)   . 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 Systemic hypertension     Past Surgical History:  Procedure Laterality Date  . CARDIAC CATHETERIZATION  2002; 2005  . CARDIOVERSION N/A 05/08/2014   Procedure: CARDIOVERSION;  Surgeon: Sanda Klein, MD;  Location: [facility];  Service: Cardiovascular;  Laterality: N/A;  . CHOLECYSTECTOMY N/A 01/17/2013   Procedure: LAPAROSCOPIC CHOLECYSTECTOMY;  Surgeon: Donato Heinz, MD;  Location: [facility];  Service: General;  Laterality: N/A;  . COLONOSCOPY  July 2007   Dr. Oneida Alar:  ascending and sigmoid colon diverticula  . COLONOSCOPY  Dec 2011   Dr. Oneida Alar: ischemic colitis. CTA performed with patent celiac, SMA, and IMA  . COLONOSCOPY N/A 08/06/2013   Dr. Gala Romney: colonic diverticulosis, internal hemorrhoids   . COLONOSCOPY N/A 08/22/2016   Dr. Gala Romney: non-bleeding internal hemorrhoids, diverticulosis in sigmoid colon.   . CORONARY ARTERY BYPASS GRAFT  01/2001   LIMA TO LAD,SVG TO DIAGONAL,SVG TO MARGINAL,SVG SEQUENTALLY TO THE PD & PL VESSEL  . KNEE ARTHROSCOPY Right ~ 1985  . LEFT HEART CATHETERIZATION WITH CORONARY/GRAFT ANGIOGRAM N/A 06/11/2014   Procedure: LEFT HEART CATHETERIZATION WITH Beatrix Fetters;  Surgeon: Peter M Martinique, MD;  Location: [facility] CATH LAB;  Service: Cardiovascular;  Laterality: N/A;  . MOHS SURGERY  ~ 04/2013   upper lip  . NM MYOCAR PERF WALL MOTION  08/18/2008   No significant ischemia  . TONSILLECTOMY  1953  . TUBAL LIGATION  ~ 1973  . US ECHOCARDIOGRAPHY  07/04/2008   trace MR,mild TR  . VAGINAL HYSTERECTOMY  1980    Current Outpatient Prescriptions  Medication Sig Dispense Refill  . acetaminophen (TYLENOL) 325 MG tablet Take 650 mg by mouth every 6 (six) hours as needed for mild pain, moderate pain, fever or headache.     . cholecalciferol (VITAMIN D) 1000 units tablet Take 1,000 Units by mouth 2 (two) times daily.    . furosemide (LASIX) 20 MG tablet Take 1 tablet (20 mg  total) by mouth daily. 90 tablet 3  . metoprolol (LOPRESSOR) 50 MG tablet Take 1 tablet (50 mg total) by mouth 2 (two) times daily. (Patient taking differently: Take 25 mg by mouth 2 (two) times daily. ) 180 tablet 3  . Multiple Vitamin (MULITIVITAMIN WITH MINERALS) TABS Take 1 tablet by mouth daily.    . quinapril (ACCUPRIL) 5 MG tablet Take 0.5 tablets (2.5 mg total) by mouth daily. 45 tablet 3  . Rivaroxaban (XARELTO) 15 MG TABS tablet Take 1 tablet (15 mg total) by mouth daily with supper. 90 tablet 3  . azithromycin (ZITHROMAX Z-PAK) 250 MG tablet 2 po day  one, then 1 daily x 4 days (Patient not taking: Reported on 10/17/2016) 5 tablet 0  . hydrocortisone (PROCTOZONE-HC) 2.5 % rectal cream Place 1 application rectally 2 (two) times daily. (Patient not taking: Reported on 10/17/2016) 30 g 0  . polyethylene glycol-electrolytes (TRILYTE) 420 g solution Take 4,000 mLs by mouth as directed. (Patient not taking: Reported on 10/17/2016) 4000 mL 0   No current facility-administered medications for this visit.     Allergies as of 10/17/2016 - Review Complete 10/17/2016  Allergen Reaction Noted  . Codeine Nausea And Vomiting 05/24/2011  . Statins Other (See Comments) 11/09/2012    Family History  Problem Relation Age of Onset  . Heart attack Mother   . Heart attack Father   . Congestive Heart Failure Father   . Colon cancer Neg Hx     Social History   Social History  . Marital status: Divorced    Spouse name: N/A  . Number of children: N/A  . Years of education: N/A   Social History Main Topics  . Smoking status: Never Smoker  . Smokeless tobacco: Never Used  . Alcohol use Yes     Comment: 06/10/2014 "might have a beer after mowing yard or a drink at holidays"  . Drug use: No  . Sexual activity: No   Other Topics Concern  . None   Social History Narrative  . None    Review of Systems: As mentioned in HPI   Physical Exam: BP 114/71   Pulse 79   Temp 98.4 F (36.9 C) (Oral)   Ht 5\' 2"  (1.575 m)   Wt 176 lb 12.8 oz (80.2 kg)   LMP 04/25/1978   BMI 32.34 kg/m  General:   Alert and oriented. No distress noted. Pleasant and cooperative.  Head:  Normocephalic and atraumatic. Eyes:  Conjuctiva clear without scleral icterus. Mouth:  Oral mucosa pink and moist. Good dentition. No lesions. Abdomen:  +BS, soft, non-tender and non-distended. No rebound or guarding. No HSM or masses noted. Msk:  Symmetrical without gross deformities. Normal posture. Extremities:  Without edema. Neurologic:  Alert and  oriented x4;  grossly normal  neurologically. Psych:  Alert and cooperative. Normal mood and affect.

## 2016-10-18 ENCOUNTER — Encounter: Payer: Self-pay | Admitting: Gastroenterology

## 2016-10-18 DIAGNOSIS — R600 Localized edema: Secondary | ICD-10-CM | POA: Diagnosis not present

## 2016-10-18 DIAGNOSIS — S81802A Unspecified open wound, left lower leg, initial encounter: Secondary | ICD-10-CM | POA: Diagnosis not present

## 2016-10-18 NOTE — Assessment & Plan Note (Signed)
Secondary to hemorrhoids in setting of anticoagulation. Recent colonoscopy on file. Continue supportive measurements. Will have her return in 1 year or sooner if needed.

## 2016-10-19 NOTE — Progress Notes (Signed)
CC'D TO PCP °

## 2016-10-25 DIAGNOSIS — R600 Localized edema: Secondary | ICD-10-CM | POA: Diagnosis not present

## 2016-10-25 DIAGNOSIS — S81802A Unspecified open wound, left lower leg, initial encounter: Secondary | ICD-10-CM | POA: Diagnosis not present

## 2016-11-01 DIAGNOSIS — S81802A Unspecified open wound, left lower leg, initial encounter: Secondary | ICD-10-CM | POA: Diagnosis not present

## 2016-11-03 ENCOUNTER — Ambulatory Visit (INDEPENDENT_AMBULATORY_CARE_PROVIDER_SITE_OTHER): Payer: PPO | Admitting: Cardiovascular Disease

## 2016-11-03 ENCOUNTER — Encounter: Payer: Self-pay | Admitting: Cardiovascular Disease

## 2016-11-03 VITALS — BP 124/66 | HR 88 | Ht 62.0 in | Wt 177.0 lb

## 2016-11-03 DIAGNOSIS — I340 Nonrheumatic mitral (valve) insufficiency: Secondary | ICD-10-CM | POA: Diagnosis not present

## 2016-11-03 DIAGNOSIS — I25708 Atherosclerosis of coronary artery bypass graft(s), unspecified, with other forms of angina pectoris: Secondary | ICD-10-CM | POA: Diagnosis not present

## 2016-11-03 DIAGNOSIS — E7800 Pure hypercholesterolemia, unspecified: Secondary | ICD-10-CM | POA: Diagnosis not present

## 2016-11-03 DIAGNOSIS — I481 Persistent atrial fibrillation: Secondary | ICD-10-CM

## 2016-11-03 DIAGNOSIS — Z7901 Long term (current) use of anticoagulants: Secondary | ICD-10-CM

## 2016-11-03 DIAGNOSIS — N183 Chronic kidney disease, stage 3 unspecified: Secondary | ICD-10-CM

## 2016-11-03 DIAGNOSIS — I051 Rheumatic mitral insufficiency: Secondary | ICD-10-CM | POA: Insufficient documentation

## 2016-11-03 DIAGNOSIS — I10 Essential (primary) hypertension: Secondary | ICD-10-CM | POA: Diagnosis not present

## 2016-11-03 DIAGNOSIS — E669 Obesity, unspecified: Secondary | ICD-10-CM | POA: Diagnosis not present

## 2016-11-03 DIAGNOSIS — I4819 Other persistent atrial fibrillation: Secondary | ICD-10-CM

## 2016-11-03 NOTE — Progress Notes (Signed)
Cardiology Office Note    Date:  11/03/2016   ID:  Sandra Bradford, DOB 04-Aug-1940, MRN [id number]  PCP:  Sharilyn Sites, MD  Cardiologist:   Sanda Klein, MD   Chief Complaint  Patient presents with  . Follow-up    3 months    History of Present Illness:  Sandra Bradford is a 76 y.o. female with CAD s/p CABG 2002, permanent atrial fibrillation here for follow-up.   After we increased her dose of beta blocker she has had marked improvement in her symptoms of exertional dyspnea. She denies any angina at rest or with activity. She has not had falls or bleeding problems. Her echocardiogram showed moderate mitral insufficiency and moderate tricuspid insufficiency, but normal left ventricular systolic function. She reports having a murmur ever since rheumatic fever at age 38.  She still has not had full healing of the site of a skin cancer removed from her left lower extremity, with healing delayed probably due to severe venous insufficiency. She is wearing compression stockings.  She has tried numerous statins but has not been able to tolerate any of them. She has chronic leg edema related to venous insufficiency that has generally been well controlled. She has chronic kidney disease stage 3 followed by Dr. Moshe Cipro.  Cath 2016: 1. Severe 3 vessel obstructive CAD 2. Patent but atretic LIMA to the LAD. The distal LAD is small and severely diseased. 3. Occluded SVG to the diagonal 4. Patent SVG to OM 5. Patent SVG to the PDA   Past Medical History:  Diagnosis Date  . A-fib ([place])   . Abnormal nuclear cardiac imaging test 05/01/2014  . Arthritis    "scattered" (06/10/2014)  . Carotid arterial disease ([place])   . Chronic bronchitis ([place])    "several times; haven't had it since I quit working in 2008" (06/10/2014)  . Chronic kidney disease, stage 3   . Coronary artery disease   . Diverticulitis   . DVT (deep venous thrombosis) ([place]) 2001   "related to sitting around when I broke my foot"   . Heart murmur   . History of blood transfusion 01/2001   "related to OHS"   . Hyperlipemia   . PAD (peripheral artery disease) ([place])   . Pneumonia ~ 2010 X 2  . PONV (postoperative nausea and vomiting)   . 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 Systemic hypertension     Past Surgical History:  Procedure Laterality Date  . CARDIAC CATHETERIZATION  2002; 2005  . CARDIOVERSION N/A 05/08/2014   Procedure: CARDIOVERSION;  Surgeon: Sanda Klein, MD;  Location: [facility];  Service: Cardiovascular;  Laterality: N/A;  . CHOLECYSTECTOMY N/A 01/17/2013   Procedure: LAPAROSCOPIC CHOLECYSTECTOMY;  Surgeon: Donato Heinz, MD;  Location: [facility];  Service: General;  Laterality: N/A;  . COLONOSCOPY  July 2007   Dr. Oneida Alar: ascending and sigmoid colon diverticula  . COLONOSCOPY  Dec 2011   Dr. Oneida Alar: ischemic colitis. CTA performed with patent celiac, SMA, and IMA  . COLONOSCOPY N/A 08/06/2013   Dr. Gala Romney: colonic diverticulosis, internal hemorrhoids   . COLONOSCOPY N/A 08/22/2016   Dr. Gala Romney: non-bleeding internal hemorrhoids, diverticulosis in sigmoid colon.   . CORONARY ARTERY BYPASS GRAFT  01/2001   LIMA TO LAD,SVG TO DIAGONAL,SVG TO MARGINAL,SVG SEQUENTALLY TO THE PD & PL VESSEL  . KNEE ARTHROSCOPY Right ~ 1985  . LEFT HEART CATHETERIZATION WITH CORONARY/GRAFT ANGIOGRAM N/A 06/11/2014   Procedure: LEFT HEART CATHETERIZATION WITH CORONARY/GRAFT  Cyril Loosen;  Surgeon: Peter M Martinique, MD;  Location: [facility] C[facility] LAB;  Service: Cardiovascular;  Laterality: N/A;  . MOHS SURGERY  ~ 04/2013   upper lip  . NM MYOCAR PERF WALL MOTION  08/18/2008   No significant ischemia  . TONSILLECTOMY  1953  . TUBAL LIGATION  ~ 1973  . US ECHOCARDIOGRAPHY  07/04/2008   trace MR,mild TR  . VAGINAL HYSTERECTOMY  1980    Current Medications: Outpatient Medications Prior to Visit  Medication Sig Dispense Refill  . acetaminophen (TYLENOL) 325 MG tablet Take 650 mg by mouth every 6 (six) hours as needed  for mild pain, moderate pain, fever or headache.     . cholecalciferol (VITAMIN D) 1000 units tablet Take 1,000 Units by mouth 2 (two) times daily.    . furosemide (LASIX) 20 MG tablet Take 1 tablet (20 mg total) by mouth daily. 90 tablet 3  . metoprolol (LOPRESSOR) 50 MG tablet Take 1 tablet (50 mg total) by mouth 2 (two) times daily. (Patient taking differently: Take 25 mg by mouth 2 (two) times daily. ) 180 tablet 3  . Multiple Vitamin (MULITIVITAMIN WITH MINERALS) TABS Take 1 tablet by mouth daily.    . quinapril (ACCUPRIL) 5 MG tablet Take 0.5 tablets (2.5 mg total) by mouth daily. 45 tablet 3  . Rivaroxaban (XARELTO) 15 MG TABS tablet Take 1 tablet (15 mg total) by mouth daily with supper. 90 tablet 3  . azithromycin (ZITHROMAX Z-PAK) 250 MG tablet 2 po day one, then 1 daily x 4 days (Patient not taking: Reported on 10/17/2016) 5 tablet 0  . hydrocortisone (PROCTOZONE-HC) 2.5 % rectal cream Place 1 application rectally 2 (two) times daily. (Patient not taking: Reported on 10/17/2016) 30 g 0  . polyethylene glycol-electrolytes (TRILYTE) 420 g solution Take 4,000 mLs by mouth as directed. (Patient not taking: Reported on 10/17/2016) 4000 mL 0   No facility-administered medications prior to visit.      Allergies:   Codeine and Statins   Social History   Social History  . Marital status: Divorced    Spouse name: N/A  . Number of children: N/A  . Years of education: N/A   Social History Main Topics  . Smoking status: Never Smoker  . Smokeless tobacco: Never Used  . Alcohol use Yes     Comment: 06/10/2014 "might have a beer after mowing yard or a drink at holidays"  . Drug use: No  . Sexual activity: No   Other Topics Concern  . None   Social History Narrative  . None     Family History:  The patient's family history includes Congestive Heart Failure in her father; Heart attack in her father and mother.   ROS:   Please see the history of present illness.    ROS All other  systems reviewed and are negative.   PHYSICAL EXAM:   VS:  BP 124/66   Pulse 88   Ht 5\' 2"  (1.575 m)   Wt 177 lb (80.3 kg)   LMP 04/25/1978   BMI 32.37 kg/m    GEN: Well nourished, well developed, in no acute distress  HEENT: normal  Neck: no JVD, carotid bruits, or masses Cardiac: irregular; 2/6 early peaking aortic ejection murmur, no diastolic murmurs, rubs, or gallops,no edema  Respiratory:  clear to auscultation bilaterally, normal work of breathing GI: soft, nontender, nondistended, + BS MS: no deformity or atrophy  Skin: warm and dry, no rash Neuro:  Alert and Oriented x 3,  Strength and sensation are intact Psych: euthymic mood, full affect  Wt Readings from Last 3 Encounters:  11/03/16 177 lb (80.3 kg)  10/17/16 176 lb 12.8 oz (80.2 kg)  09/16/16 174 lb (78.9 kg)      Studies/Labs Reviewed:   EKG:  EKG is not ordered today.   Recent Labs: 05/20/2016: ALT 21 08/20/2016: BUN 26; Creatinine, Ser 1.26; Hemoglobin 14.0; Platelets 202; Potassium 4.3; Sodium 139   Lipid Panel    Component Value Date/Time   CHOL 253 (H) 05/20/2016 0842   TRIG 108 05/20/2016 0842   HDL 56 05/20/2016 0842   CHOLHDL 4.5 05/20/2016 0842   VLDL 22 05/20/2016 0842   LDLCALC 175 (H) 05/20/2016 1761     ASSESSMENT:    No diagnosis found.   PLAN:  In order of problems listed above: 1. AFib: Improved symptoms of exertional dyspnea after better rate control. CHADSVasc at least 4.  2. Anticoagulated with Xarelto (dose adjusted for low GFR).  3. CAD s/p CABG: She had mild complaints of exertional angina, CCS class II when her rate control was poor, now resolved. 4. Moderate MR: Unlikely to be hemodynamically significant. Could be the consequence of remote rheumatic heart disease. 5. CKD: estimated GFR 35-40 mL/min, on lower dose Xarelto. 6. HLP: Severely elevated LDL cholesterol, intolerant to all statins that we have tried. Will again look into PCSK9 inhibitor, when we last tried were  unable to provide without prohibitive cost.  7. HTN: Excellent control. Obesity: Continue to encourage weight loss.    Medication Adjustments/Labs and Tests Ordered: Current medicines are reviewed at length with the patient today.  Concerns regarding medicines are outlined above.  Medication changes, Labs and Tests ordered today are listed in the Patient Instructions below. There are no Patient Instructions on file for this visit.   Signed, Sanda Klein, MD  11/03/2016 1:14 PM    [facility] [address], [place],   [address] Phone: [phone]; Fax: [phone]

## 2016-11-03 NOTE — Patient Instructions (Signed)
Dr Croitoru recommends that you schedule a follow-up appointment in 12 months. You will receive a reminder letter in the mail two months in advance. If you don't receive a letter, please call our office to schedule the follow-up appointment.  If you need a refill on your cardiac medications before your next appointment, please call your pharmacy. 

## 2016-11-29 ENCOUNTER — Telehealth: Payer: Self-pay | Admitting: Pharmacist

## 2016-11-29 DIAGNOSIS — E7800 Pure hypercholesterolemia, unspecified: Secondary | ICD-10-CM

## 2016-11-29 NOTE — Telephone Encounter (Signed)
Patient to repeat Lipid panel this week and complete paperwork for Repatha patient assistance. Orders in [place]

## 2016-12-02 DIAGNOSIS — E7800 Pure hypercholesterolemia, unspecified: Secondary | ICD-10-CM | POA: Diagnosis not present

## 2016-12-02 LAB — LIPID PANEL
CHOL/HDL RATIO: 4 ratio (ref 0.0–4.4)
Cholesterol, Total: 229 mg/dL — ABNORMAL HIGH (ref 100–199)
HDL: 57 mg/dL (ref >39)
LDL CALC: 152 mg/dL — AB (ref 0–99)
Triglycerides: 98 mg/dL (ref 0–149)
VLDL CHOLESTEROL CAL: 20 mg/dL (ref 5–40)

## 2016-12-05 DIAGNOSIS — E782 Mixed hyperlipidemia: Secondary | ICD-10-CM | POA: Diagnosis not present

## 2016-12-08 ENCOUNTER — Other Ambulatory Visit: Payer: Self-pay | Admitting: Pharmacist

## 2016-12-08 MED ORDER — EVOLOCUMAB 140 MG/ML ~~LOC~~ SOAJ
140.0000 mg | SUBCUTANEOUS | 6 refills | Status: DC
Start: 2016-12-08 — End: 2016-12-28

## 2016-12-21 ENCOUNTER — Telehealth: Payer: Self-pay | Admitting: Pharmacist

## 2016-12-21 NOTE — Telephone Encounter (Signed)
STATIN history  Rosuvastatin (crestor) 10 mg daily - 01/12/2005  Pravachol 40mg  qHS - 07/08/2005 Simvastatin 40mg  qHS - 07/13/2007   Additional cholesterol management  Zetia 10mg  daily Welchol 625mg  2 tabs BID Niacin 500mg  BID

## 2016-12-28 ENCOUNTER — Other Ambulatory Visit: Payer: Self-pay | Admitting: Pharmacist

## 2016-12-28 MED ORDER — EVOLOCUMAB 140 MG/ML ~~LOC~~ SOAJ: 140.0000 mg | SUBCUTANEOUS | 11 refills | Status: DC

## 2016-12-29 ENCOUNTER — Telehealth: Payer: Self-pay | Admitting: Cardiovascular Disease

## 2016-12-29 NOTE — Telephone Encounter (Signed)
New message    Pt c/o medication issue:  1. Name of Medication: repatha  2. How are you currently taking this medication (dosage and times per day)? 140 mg  3. Are you having a reaction (difficulty breathing--STAT)? no  4. What is your medication issue? Pharmacy needs pt ICD code and allergies to finish processing medication.

## 2016-12-29 NOTE — Telephone Encounter (Signed)
Information sent by fax and confirmed by phone today

## 2017-01-05 DIAGNOSIS — Z6835 Body mass index (BMI) 35.0-35.9, adult: Secondary | ICD-10-CM | POA: Diagnosis not present

## 2017-01-05 DIAGNOSIS — N183 Chronic kidney disease, stage 3 (moderate): Secondary | ICD-10-CM | POA: Diagnosis not present

## 2017-01-05 DIAGNOSIS — I10 Essential (primary) hypertension: Secondary | ICD-10-CM | POA: Diagnosis not present

## 2017-01-12 ENCOUNTER — Emergency Department (HOSPITAL_COMMUNITY)
Admission: EM | Admit: 2017-01-12 | Discharge: 2017-01-12 | Disposition: A | Discharge status: Home / Self Care | Payer: PPO | Attending: Emergency Medicine | Admitting: Emergency Medicine

## 2017-01-12 ENCOUNTER — Encounter (HOSPITAL_COMMUNITY): Payer: Self-pay | Admitting: *Deleted

## 2017-01-12 ENCOUNTER — Emergency Department (HOSPITAL_COMMUNITY): Payer: PPO

## 2017-01-12 DIAGNOSIS — Z79899 Other long term (current) drug therapy: Secondary | ICD-10-CM | POA: Insufficient documentation

## 2017-01-12 DIAGNOSIS — I482 Chronic atrial fibrillation: Secondary | ICD-10-CM | POA: Insufficient documentation

## 2017-01-12 DIAGNOSIS — M542 Cervicalgia: Secondary | ICD-10-CM | POA: Diagnosis not present

## 2017-01-12 DIAGNOSIS — M5030 Other cervical disc degeneration, unspecified cervical region: Secondary | ICD-10-CM | POA: Diagnosis not present

## 2017-01-12 DIAGNOSIS — N3000 Acute cystitis without hematuria: Secondary | ICD-10-CM | POA: Insufficient documentation

## 2017-01-12 DIAGNOSIS — I2510 Atherosclerotic heart disease of native coronary artery without angina pectoris: Secondary | ICD-10-CM | POA: Diagnosis not present

## 2017-01-12 DIAGNOSIS — N183 Chronic kidney disease, stage 3 (moderate): Secondary | ICD-10-CM | POA: Insufficient documentation

## 2017-01-12 DIAGNOSIS — R079 Chest pain, unspecified: Secondary | ICD-10-CM | POA: Diagnosis not present

## 2017-01-12 DIAGNOSIS — I4821 Permanent atrial fibrillation: Secondary | ICD-10-CM

## 2017-01-12 DIAGNOSIS — Z7901 Long term (current) use of anticoagulants: Secondary | ICD-10-CM | POA: Insufficient documentation

## 2017-01-12 DIAGNOSIS — I129 Hypertensive chronic kidney disease with stage 1 through stage 4 chronic kidney disease, or unspecified chronic kidney disease: Secondary | ICD-10-CM | POA: Diagnosis not present

## 2017-01-12 DIAGNOSIS — Z951 Presence of aortocoronary bypass graft: Secondary | ICD-10-CM | POA: Diagnosis not present

## 2017-01-12 DIAGNOSIS — I481 Persistent atrial fibrillation: Secondary | ICD-10-CM | POA: Diagnosis not present

## 2017-01-12 LAB — CBC
HCT: 41.6 % (ref 36.0–46.0)
Hemoglobin: 13.8 g/dL (ref 12.0–15.0)
MCH: 30.1 pg (ref 26.0–34.0)
MCHC: 33.2 g/dL (ref 30.0–36.0)
MCV: 90.6 fL (ref 78.0–100.0)
PLATELETS: 190 10*3/uL (ref 150–400)
RBC: 4.59 MIL/uL (ref 3.87–5.11)
RDW: 14 % (ref 11.5–15.5)
WBC: 7 10*3/uL (ref 4.0–10.5)

## 2017-01-12 LAB — URINALYSIS, ROUTINE W REFLEX MICROSCOPIC
Bilirubin Urine: NEGATIVE
GLUCOSE, UA: NEGATIVE mg/dL
Hgb urine dipstick: NEGATIVE
Ketones, ur: NEGATIVE mg/dL
Nitrite: NEGATIVE
PH: 6 (ref 5.0–8.0)
PROTEIN: 30 mg/dL — AB
Specific Gravity, Urine: 1.013 (ref 1.005–1.030)

## 2017-01-12 LAB — BASIC METABOLIC PANEL
Anion gap: 9 (ref 5–15)
BUN: 20 mg/dL (ref 6–20)
CALCIUM: 9.4 mg/dL (ref 8.9–10.3)
CO2: 27 mmol/L (ref 22–32)
CREATININE: 1.1 mg/dL — AB (ref 0.44–1.00)
Chloride: 100 mmol/L — ABNORMAL LOW (ref 101–111)
GFR calc Af Amer: 54 mL/min — ABNORMAL LOW (ref >60)
GFR, EST NON AFRICAN AMERICAN: 47 mL/min — AB (ref >60)
GLUCOSE: 99 mg/dL (ref 65–99)
Potassium: 4.1 mmol/L (ref 3.5–5.1)
SODIUM: 136 mmol/L (ref 135–145)

## 2017-01-12 LAB — CBG MONITORING, ED: GLUCOSE-CAPILLARY: 93 mg/dL (ref 65–99)

## 2017-01-12 LAB — I-STAT TROPONIN, ED: TROPONIN I, POC: 0.01 ng/mL (ref 0.00–0.08)

## 2017-01-12 MED ORDER — METOPROLOL TARTRATE 5 MG/5ML IV SOLN
5.0000 mg | Freq: Once | INTRAVENOUS | Status: AC
  Administered 2017-01-12: 5 mg via INTRAVENOUS
  Filled 2017-01-12: qty 5

## 2017-01-12 MED ORDER — DICLOFENAC SODIUM ER 100 MG PO TB24: 100.0000 mg | ORAL_TABLET | Freq: Every day | ORAL | 0 refills | Status: DC

## 2017-01-12 MED ORDER — ASPIRIN 81 MG PO CHEW
324.0000 mg | CHEWABLE_TABLET | Freq: Once | ORAL | Status: AC
  Administered 2017-01-12: 324 mg via ORAL
  Filled 2017-01-12: qty 4

## 2017-01-12 MED ORDER — SODIUM CHLORIDE 0.9 % IV SOLN
INTRAVENOUS | Status: DC
  Administered 2017-01-12: 16:00:00 via INTRAVENOUS

## 2017-01-12 MED ORDER — CEPHALEXIN 500 MG PO CAPS
500.0000 mg | ORAL_CAPSULE | Freq: Once | ORAL | Status: AC
  Administered 2017-01-12: 500 mg via ORAL
  Filled 2017-01-12: qty 1

## 2017-01-12 MED ORDER — SODIUM CHLORIDE 0.9 % IV BOLUS (SEPSIS)
1000.0000 mL | Freq: Once | INTRAVENOUS | Status: AC
  Administered 2017-01-12: 1000 mL via INTRAVENOUS

## 2017-01-12 MED ORDER — KETOROLAC TROMETHAMINE 30 MG/ML IJ SOLN
30.0000 mg | Freq: Once | INTRAMUSCULAR | Status: AC
  Administered 2017-01-12: 30 mg via INTRAVENOUS
  Filled 2017-01-12: qty 1

## 2017-01-12 MED ORDER — CEPHALEXIN 500 MG PO CAPS: 500.0000 mg | ORAL_CAPSULE | Freq: Four times a day (QID) | ORAL | 0 refills | Status: DC

## 2017-01-12 NOTE — ED Triage Notes (Signed)
Pt initially came in for left neck and middle right back pain. Pt heart rate reading fluctuating between 106-140 in triage.

## 2017-01-12 NOTE — ED Provider Notes (Signed)
[facility] DEPT Provider Note   CSN: [id number] Arrival date & time: 01/12/17  1417     History   Chief Complaint Chief Complaint  Patient presents with  . Tachycardia    HPI Sandra Bradford is a 76 y.o. female.  Pt presents to the ED today with neck pain.  The pt said sx have been going on for the past few days.  The pt denies any numbness or tingling in her arms.  No trauma.  Pt does have a hx of a.fib and CAD s/p CABG.  She did not take any of her meds today and her heart rate is high.  Pt said she's always in a.fib and her HR will occasionally go fast whether she takes the meds or not.  Pt denies any sob.  She is anticoagulated on Xarelto.  Pt is followed by Dr. Sallyanne Kuster.  She last saw him 11/03/16.    CHA2DS2/VAS Stroke Risk Points      5 >= 2 Points: High Risk  1 - 1.99 Points: Medium Risk  0 Points: Low Risk    This is the only CHA2DS2/VAS Stroke Risk Points available for the past  year.:  Change: N/A         Details    Note: External data might be a factor in metrics not marked with    Points Metrics   This score determines the patient's risk of having a stroke if the  patient has atrial fibrillation.       0 Has Congestive Heart Failure:  No   1 Has Vascular Disease:  Yes   1 Has Hypertension:  Yes   2 Age:  55   0 Has Diabetes:  No   0 Had Stroke:  No Had TIA:  No Had thromboembolism:  No   1 Female:  Yes             Past Medical History:  Diagnosis Date  . A-fib ([place])   . Abnormal nuclear cardiac imaging test 05/01/2014  . Arthritis    "scattered" (06/10/2014)  . Carotid arterial disease ([place])   . Chronic bronchitis ([place])    "several times; haven't had it since I quit working in 2008" (06/10/2014)  . Chronic kidney disease, stage 3   . Coronary artery disease   . Diverticulitis   . DVT (deep venous thrombosis) ([place]) 2001   "related to sitting around when I broke my foot"  . Heart murmur   . History of blood transfusion 01/2001   "related to OHS"    . Hyperlipemia   . PAD (peripheral artery disease) ([place])   . Pneumonia ~ 2010 X 2  . PONV (postoperative nausea and vomiting)   . 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 Systemic hypertension     Patient Active Problem List   Diagnosis Date Noted  . Moderate mitral insufficiency 11/03/2016  . Rectal bleeding 08/18/2016  . PAF (paroxysmal atrial fibrillation) ([place]) 06/11/2014  . Chronic anticoagulation 06/11/2014  . Acute on chronic renal insufficiency 06/10/2014  . Persistent atrial fibrillation ([place])   . Abnormal nuclear cardiac imaging test 05/01/2014  . Dyspnea on exertion 03/03/2014  . Chronic kidney disease, stage 3 02/10/2014  . Diverticulitis of colon (without mention of hemorrhage)(562.11) 07/16/2013  . Atrial fibrillation ([place]) 06/21/2013  . Systolic murmur 17/61/6073  . Preoperative cardiovascular examination 01/15/2013  . CAD-CABG 2002, cath 06/11/14 11/10/2012  . PAD (peripheral artery disease) ([place]) 11/10/2012  . Hyperlipidemia  11/10/2012  . Statin intolerance 11/10/2012  . HTN (hypertension) 11/10/2012  . Obesity (BMI 30.0-34.9) 11/10/2012  . Venous insufficiency, peripheral 11/10/2012    Past Surgical History:  Procedure Laterality Date  . CARDIAC CATHETERIZATION  2002; 2005  . CARDIOVERSION N/A 05/08/2014   Procedure: CARDIOVERSION;  Surgeon: Sanda Klein, MD;  Location: [facility];  Service: Cardiovascular;  Laterality: N/A;  . CHOLECYSTECTOMY N/A 01/17/2013   Procedure: LAPAROSCOPIC CHOLECYSTECTOMY;  Surgeon: Donato Heinz, MD;  Location: [facility];  Service: General;  Laterality: N/A;  . COLONOSCOPY  July 2007   Dr. Oneida Alar: ascending and sigmoid colon diverticula  . COLONOSCOPY  Dec 2011   Dr. Oneida Alar: ischemic colitis. CTA performed with patent celiac, SMA, and IMA  . COLONOSCOPY N/A 08/06/2013   Dr. Gala Romney: colonic diverticulosis, internal hemorrhoids   . COLONOSCOPY N/A 08/22/2016   Dr. Gala Romney: non-bleeding internal hemorrhoids,  diverticulosis in sigmoid colon.   . CORONARY ARTERY BYPASS GRAFT  01/2001   LIMA TO LAD,SVG TO DIAGONAL,SVG TO MARGINAL,SVG SEQUENTALLY TO THE PD & PL VESSEL  . KNEE ARTHROSCOPY Right ~ 1985  . LEFT HEART CATHETERIZATION WITH CORONARY/GRAFT ANGIOGRAM N/A 06/11/2014   Procedure: LEFT HEART CATHETERIZATION WITH Beatrix Fetters;  Surgeon: Peter M Martinique, MD;  Location: [facility] CATH LAB;  Service: Cardiovascular;  Laterality: N/A;  . MOHS SURGERY  ~ 04/2013   upper lip  . NM MYOCAR PERF WALL MOTION  08/18/2008   No significant ischemia  . TONSILLECTOMY  1953  . TUBAL LIGATION  ~ 1973  . US ECHOCARDIOGRAPHY  07/04/2008   trace MR,mild TR  . VAGINAL HYSTERECTOMY  1980    OB History    No data available       Home Medications    Prior to Admission medications   Medication Sig Start Date End Date Taking? Authorizing Provider  acetaminophen (TYLENOL) 325 MG tablet Take 650 mg by mouth every 6 (six) hours as needed for mild pain, moderate pain, fever or headache.    Yes [provider]  cholecalciferol (VITAMIN D) 1000 units tablet Take 1,000 Units by mouth 2 (two) times daily.   Yes [provider]  Flaxseed, Linseed, (FLAX SEED OIL PO) Take 1 capsule by mouth daily.   Yes [provider]  furosemide (LASIX) 20 MG tablet Take 1 tablet (20 mg total) by mouth daily. 05/23/16  Yes Croitoru, Mihai, MD  metoprolol (LOPRESSOR) 50 MG tablet Take 1 tablet (50 mg total) by mouth 2 (two) times daily. 05/23/16  Yes Croitoru, Mihai, MD  Multiple Vitamin (MULITIVITAMIN WITH MINERALS) TABS Take 1 tablet by mouth daily.   Yes [provider]  quinapril (ACCUPRIL) 5 MG tablet Take 0.5 tablets (2.5 mg total) by mouth daily. 05/23/16  Yes Croitoru, Mihai, MD  Rivaroxaban (XARELTO) 15 MG TABS tablet Take 1 tablet (15 mg total) by mouth daily with supper. 05/23/16  Yes Croitoru, Mihai, MD  cephALEXin (KEFLEX) 500 MG capsule Take 1 capsule (500 mg total) by mouth 4 (four) times  daily. 01/12/17   Isla Pence, MD  Diclofenac Sodium CR 100 MG 24 hr tablet Take 1 tablet (100 mg total) by mouth daily. 01/12/17   Isla Pence, MD  Evolocumab (REPATHA SURECLICK) 409 MG/ML SOAJ Inject 140 mg into the skin every 14 (fourteen) days. 12/28/16   Croitoru, Dani Gobble, MD    Family History Family History  Problem Relation Age of Onset  . Heart attack Mother   . Heart attack Father   . Congestive Heart Failure Father   .  Colon cancer Neg Hx     Social History Social History  Substance Use Topics  . Smoking status: Never Smoker  . Smokeless tobacco: Never Used  . Alcohol use Yes     Comment: 06/10/2014 "might have a beer after mowing yard or a drink at holidays"     Allergies   Codeine and Statins   Review of Systems Review of Systems  Cardiovascular: Positive for palpitations.  Musculoskeletal: Positive for neck pain.  All other systems reviewed and are negative.    Physical Exam Updated Vital Signs BP (!) 129/98 (BP Location: Left Arm)   Pulse 93   Temp 97.6 F (36.4 C) (Oral)   Resp 16   Ht 5\' 2"  (1.575 m)   Wt 80.3 kg (177 lb)   LMP 04/25/1978   SpO2 97%   BMI 32.37 kg/m   Physical Exam  Constitutional: She is oriented to person, place, and time. She appears well-developed and well-nourished.  HENT:  Head: Normocephalic and atraumatic.  Right Ear: External ear normal.  Left Ear: External ear normal.  Nose: Nose normal.  Mouth/Throat: Oropharynx is clear and moist.  Eyes: Pupils are equal, round, and reactive to light. Conjunctivae and EOM are normal.  Neck: Normal range of motion. Neck supple.  Cardiovascular: Normal heart sounds.  An irregularly irregular rhythm present. Tachycardia present.   Pulmonary/Chest: Effort normal and breath sounds normal.  Abdominal: Soft. Bowel sounds are normal.  Musculoskeletal: Normal range of motion.  Neurological: She is alert and oriented to person, place, and time.  Skin: Skin is warm. Capillary refill  takes less than 2 seconds.  Psychiatric: She has a normal mood and affect. Her behavior is normal. Judgment and thought content normal.  Nursing note and vitals reviewed.    ED Treatments / Results  Labs (all labs ordered are listed, but only abnormal results are displayed) Labs Reviewed  BASIC METABOLIC PANEL - Abnormal; Notable for the following:       Result Value   Chloride 100 (*)    Creatinine, Ser 1.10 (*)    GFR calc non Af Amer 47 (*)    GFR calc Af Amer 54 (*)    All other components within normal limits  URINALYSIS, ROUTINE W REFLEX MICROSCOPIC - Abnormal; Notable for the following:    APPearance HAZY (*)    Protein, ur 30 (*)    Leukocytes, UA MODERATE (*)    Bacteria, UA RARE (*)    Squamous Epithelial / LPF 0-5 (*)    All other components within normal limits  URINE CULTURE  CBC  CBG MONITORING, ED  I-STAT TROPONIN, ED    EKG  EKG Interpretation  Date/Time:  Thursday January 12 2017 14:53:16 EDT Ventricular Rate:  106 PR Interval:    QRS Duration: 88 QT Interval:  343 QTC Calculation: 456 R Axis:   64 Text Interpretation:  Atrial fibrillation Minimal ST depression, diffuse leads Confirmed by Isla Pence [phone]) on 01/12/2017 3:06:43 PM       Radiology Dg Chest 2 View  Result Date: 01/12/2017 CLINICAL DATA:  76 year old female with chest pain radiating to the back. Neck pain. Tachycardia. EXAM: CHEST  2 VIEW COMPARISON:  09/16/2016 and earlier. FINDINGS: Prior CABG. Stable mild cardiomegaly. Stable mild tortuosity of the descending thoracic aorta. Other mediastinal contours are within normal limits. Visualized tracheal air column is within normal limits. No pneumothorax, pulmonary edema, pleural effusion or confluent pulmonary opacity. Osteopenia. No acute osseous abnormality identified. Negative visible bowel gas  pattern. Stable cholecystectomy clips. IMPRESSION: No acute cardiopulmonary abnormality. Electronically Signed   By: Genevie Ann M.D.   On:  01/12/2017 15:48   Dg Cervical Spine Complete  Result Date: 01/12/2017 CLINICAL DATA:  76 year old female with chest pain radiating to the back. Neck pain. Tachycardia. EXAM: CERVICAL SPINE - COMPLETE 4+ VIEW COMPARISON:  None. FINDINGS: Osteopenia. Straightening of lower cervical lordosis. Prevertebral soft tissue contour within normal limits. Mild disc space loss and endplate spurring at I6-E7. Moderate disc space loss and spurring at C6-C7. Bilateral posterior element alignment is within normal limits. Cervicothoracic junction alignment is within normal limits. AP alignment within normal limits. Normal C1-C2 alignment and joint spaces. Negative lung apices. Carotid calcified atherosclerosis in the neck. IMPRESSION: 1.  No acute osseous abnormality identified. 2. Moderate disc and endplate degeneration at C6-C7, mild at C5-C6. 3. Calcified carotid artery atherosclerosis. Electronically Signed   By: Genevie Ann M.D.   On: 01/12/2017 15:49    Procedures Procedures (including critical care time)  Medications Ordered in ED Medications  sodium chloride 0.9 % bolus 1,000 mL (0 mLs Intravenous Stopped 01/12/17 1605)    And  0.9 %  sodium chloride infusion ( Intravenous New Bag/Given 01/12/17 1609)  cephALEXin (KEFLEX) capsule 500 mg (not administered)  aspirin chewable tablet 324 mg (324 mg Oral Given 01/12/17 1505)  metoprolol tartrate (LOPRESSOR) injection 5 mg (5 mg Intravenous Given 01/12/17 1504)  metoprolol tartrate (LOPRESSOR) injection 5 mg (5 mg Intravenous Given 01/12/17 1622)  ketorolac (TORADOL) 30 MG/ML injection 30 mg (30 mg Intravenous Given 01/12/17 1624)     Initial Impression / Assessment and Plan / ED Course  I have reviewed the triage vital signs and the nursing notes.  Pertinent labs & imaging results that were available during my care of the patient were reviewed by me and considered in my medical decision making (see chart for details).     HR is better after lopressor.  Pt is  feeling better.  Pt know to take her meds when she gets home.  Neck pain likely secondary to degenerative disc disease.  Pt treated for UTI with keflex (first dose here).  She knows to return if worse.  F/u with pcp and with cardiology.  Final Clinical Impressions(s) / ED Diagnoses   Final diagnoses:  Permanent atrial fibrillation (HCC)  Acute cystitis without hematuria  Degenerative disc disease, cervical    New Prescriptions New Prescriptions   CEPHALEXIN (KEFLEX) 500 MG CAPSULE    Take 1 capsule (500 mg total) by mouth 4 (four) times daily.   DICLOFENAC SODIUM CR 100 MG 24 HR TABLET    Take 1 tablet (100 mg total) by mouth daily.     Isla Pence, MD 01/12/17 [phone]

## 2017-01-14 LAB — URINE CULTURE

## 2017-01-16 ENCOUNTER — Encounter: Payer: Self-pay | Admitting: Cardiology

## 2017-01-16 ENCOUNTER — Ambulatory Visit (INDEPENDENT_AMBULATORY_CARE_PROVIDER_SITE_OTHER): Payer: PPO | Admitting: Cardiology

## 2017-01-16 VITALS — BP 151/91 | HR 82 | Ht 62.0 in | Wt 182.0 lb

## 2017-01-16 DIAGNOSIS — I872 Venous insufficiency (chronic) (peripheral): Secondary | ICD-10-CM | POA: Diagnosis not present

## 2017-01-16 DIAGNOSIS — I4891 Unspecified atrial fibrillation: Secondary | ICD-10-CM | POA: Diagnosis not present

## 2017-01-16 DIAGNOSIS — R000 Tachycardia, unspecified: Secondary | ICD-10-CM

## 2017-01-16 DIAGNOSIS — I4821 Permanent atrial fibrillation: Secondary | ICD-10-CM

## 2017-01-16 DIAGNOSIS — I482 Chronic atrial fibrillation: Secondary | ICD-10-CM | POA: Diagnosis not present

## 2017-01-16 DIAGNOSIS — I2510 Atherosclerotic heart disease of native coronary artery without angina pectoris: Secondary | ICD-10-CM | POA: Diagnosis not present

## 2017-01-16 DIAGNOSIS — I4819 Other persistent atrial fibrillation: Secondary | ICD-10-CM

## 2017-01-16 DIAGNOSIS — Z6833 Body mass index (BMI) 33.0-33.9, adult: Secondary | ICD-10-CM | POA: Diagnosis not present

## 2017-01-16 DIAGNOSIS — N183 Chronic kidney disease, stage 3 unspecified: Secondary | ICD-10-CM

## 2017-01-16 DIAGNOSIS — I481 Persistent atrial fibrillation: Secondary | ICD-10-CM | POA: Diagnosis not present

## 2017-01-16 DIAGNOSIS — Z7901 Long term (current) use of anticoagulants: Secondary | ICD-10-CM | POA: Diagnosis not present

## 2017-01-16 DIAGNOSIS — E785 Hyperlipidemia, unspecified: Secondary | ICD-10-CM

## 2017-01-16 DIAGNOSIS — Z1389 Encounter for screening for other disorder: Secondary | ICD-10-CM | POA: Diagnosis not present

## 2017-01-16 DIAGNOSIS — I10 Essential (primary) hypertension: Secondary | ICD-10-CM

## 2017-01-16 DIAGNOSIS — E782 Mixed hyperlipidemia: Secondary | ICD-10-CM | POA: Diagnosis not present

## 2017-01-16 NOTE — Assessment & Plan Note (Signed)
Chronic venous edema

## 2017-01-16 NOTE — Progress Notes (Signed)
01/16/2017 Sandra Bradford   [birth year]  [id number] is the plan.   Primary Physician Sharilyn Sites, MD Primary Cardiologist: Dr  Sallyanne Kuster HPI:  76 y/o female with a history of CAF and CAD, seen in the office today after a recent ED visit 01/12/17 for tachycardia. She has CAF, failed DCCV in 2016 and rate control and anticoagulation is the plan. Echo in March 2018 showed normal LVF with moderate LAE, MR, and TR.   The pt had CABG x 5 in 2002. Her last cath was 06/11/14 and medical Rx was recommended. She tells me she was driving and developed increased heart rate. She noted she "forgot" to take her medications that day and admitted she does this on occasion. In the ED her HR was only 106. She was advised to take her medications, including her beta blocker as directed. She tells me she "hates" being in AF and says rapid HR frequently bothers her.    Current Outpatient Prescriptions  Medication Sig Dispense Refill  . acetaminophen (TYLENOL) 325 MG tablet Take 650 mg by mouth every 6 (six) hours as needed for mild pain, moderate pain, fever or headache.     . cephALEXin (KEFLEX) 500 MG capsule Take 1 capsule (500 mg total) by mouth 4 (four) times daily. 28 capsule 0  . cholecalciferol (VITAMIN D) 1000 units tablet Take 1,000 Units by mouth 2 (two) times daily.    . Diclofenac Sodium CR 100 MG 24 hr tablet Take 1 tablet (100 mg total) by mouth daily. 30 tablet 0  . Evolocumab (REPATHA SURECLICK) 732 MG/ML SOAJ Inject 140 mg into the skin every 14 (fourteen) days. 2 pen 11  . Flaxseed, Linseed, (FLAX SEED OIL PO) Take 1 capsule by mouth daily.    . furosemide (LASIX) 20 MG tablet Take 1 tablet (20 mg total) by mouth daily. 90 tablet 3  . metoprolol (LOPRESSOR) 50 MG tablet Take 1 tablet (50 mg total) by mouth 2 (two) times daily. 180 tablet 3  . Multiple Vitamin (MULITIVITAMIN WITH MINERALS) TABS Take 1 tablet by mouth daily.    . quinapril (ACCUPRIL) 5 MG tablet Take 0.5 tablets (2.5 mg total) by mouth  daily. 45 tablet 3  . Rivaroxaban (XARELTO) 15 MG TABS tablet Take 1 tablet (15 mg total) by mouth daily with supper. 90 tablet 3   No current facility-administered medications for this visit.     Allergies  Allergen Reactions  . Codeine Nausea And Vomiting  . Statins Other (See Comments)    Reaction:  Muscle pain and confusion     Past Medical History:  Diagnosis Date  . A-fib ([place])   . Abnormal nuclear cardiac imaging test 05/01/2014  . Arthritis    "scattered" (06/10/2014)  . Carotid arterial disease ([place])   . Chronic bronchitis ([place])    "several times; haven't had it since I quit working in 2008" (06/10/2014)  . Chronic kidney disease, stage 3   . Coronary artery disease   . Diverticulitis   . DVT (deep venous thrombosis) ([place]) 2001   "related to sitting around when I broke my foot"  . Heart murmur   . History of blood transfusion 01/2001   "related to OHS"   . Hyperlipemia   . PAD (peripheral artery disease) ([place])   . Pneumonia ~ 2010 X 2  . PONV (postoperative nausea and vomiting)   . 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 Systemic hypertension     Social  History   Social History  . Marital status: Divorced    Spouse name: N/A  . Number of children: N/A  . Years of education: N/A   Occupational History  . Not on file.   Social History Main Topics  . Smoking status: Never Smoker  . Smokeless tobacco: Never Used  . Alcohol use Yes     Comment: 06/10/2014 "might have a beer after mowing yard or a drink at holidays"  . Drug use: No  . Sexual activity: No   Other Topics Concern  . Not on file   Social History Narrative  . No narrative on file     Family History  Problem Relation Age of Onset  . Heart attack Mother   . Heart attack Father   . Congestive Heart Failure Father   . Colon cancer Neg Hx      Review of Systems: General: negative for chills, fever, night sweats or weight changes.  Cardiovascular: negative for chest pain,  dyspnea on exertion, edema, orthopnea, paroxysmal nocturnal dyspnea or shortness of breath Dermatological: negative for rash Respiratory: negative for cough or wheezing Urologic: negative for hematuria Abdominal: negative for nausea, vomiting, diarrhea, bright red blood per rectum, melena, or hematemesis Neurologic: negative for visual changes, syncope, or dizziness All other systems reviewed and are otherwise negative except as noted above.    Blood pressure (!) 151/91, pulse 82, height 5\' 2"  (1.575 m), weight 182 lb (82.6 kg), last menstrual period 04/25/1978.  General appearance: alert, cooperative and no distress Lungs: clear to auscultation bilaterally Heart: irregularly irregular rhythm Extremities: venous stasis dermatitis noted and 1+ edema Skin: Skin color, texture, turgor normal. No rashes or lesions Neurologic: Grossly normal  EKG AF with VR 82  ASSESSMENT AND PLAN:   Persistent atrial fibrillation ([place]) Failed to hold after DCCV 2016-ED visit 01/12/17 with "tachycardia" though this is not documented  CAD-CABG 2002, cath 06/11/14 Status post CABG 2002 (LIMA to LAD, SVG to first diagonal, SVG to first OM, sequential SVG to PDA and PLA), Lucianne Lei Trigt Abnormal nuclear stress test 04/29/2014 which led to cath 06/11/14- plan is medical Rx  Chronic anticoagulation Xarelto 15 mg (CRI-3)  Chronic kidney disease, stage 3 Followed by Dr Moshe Cipro, last SCr was 1.1  HTN (hypertension) Borderline high in the office today  Dyslipidemia Statin intolerance  Venous insufficiency, peripheral Chronic venous edema   PLAN  I suggested we get a 48 hr Holter to see what her heart rate is doing. If she is running fast my first inclination would be to add Diltiazem but I'm concerned about possible worsening LE edema. Will review with Dr Sallyanne Kuster after her Holter before adding further Rx. Recent SCr was WNL but that is not her normal, will not adjust Xarelto dose at this time.    Kerin Ransom PA-C 01/16/2017 1:32 PM

## 2017-01-16 NOTE — Assessment & Plan Note (Addendum)
Statin intolerance 

## 2017-01-16 NOTE — Assessment & Plan Note (Signed)
Followed by Dr Moshe Cipro, last SCr was 1.1

## 2017-01-16 NOTE — Assessment & Plan Note (Signed)
Failed to hold after DCCV 2016-ED visit 01/12/17 with "tachycardia" though this is not documented

## 2017-01-16 NOTE — Assessment & Plan Note (Signed)
Borderline high in the office today

## 2017-01-16 NOTE — Assessment & Plan Note (Signed)
Xarelto 15 mg (CRI-3)

## 2017-01-16 NOTE — Patient Instructions (Signed)
Medication Instructions: Your physician recommends that you continue on your current medications as directed. Please refer to the Current Medication list given to you today.   Testing/Procedures: Your physician has recommended that you wear a 48 hour holter monitor. Holter monitors are medical devices that record the heart's electrical activity. Doctors most often use these monitors to diagnose arrhythmias. Arrhythmias are problems with the speed or rhythm of the heartbeat. The monitor is a small, portable device. You can wear one while you do your normal daily activities. This is usually used to diagnose what is causing palpitations/syncope (passing out).  This will be placed at our [address]  Follow-Up: Your physician recommends that you schedule a follow-up appointment in: 3 months with Dr. Sallyanne Kuster.  If you need a refill on your cardiac medications before your next appointment, please call your pharmacy.

## 2017-01-16 NOTE — Assessment & Plan Note (Signed)
Status post CABG 2002 (LIMA to LAD, SVG to first diagonal, SVG to first OM, sequential SVG to PDA and PLA), van Trigt Abnormal nuclear stress test 04/29/2014 which led to cath 06/11/14- plan is medical Rx

## 2017-01-31 ENCOUNTER — Other Ambulatory Visit: Payer: Self-pay | Admitting: Cardiology

## 2017-01-31 ENCOUNTER — Ambulatory Visit (INDEPENDENT_AMBULATORY_CARE_PROVIDER_SITE_OTHER): Payer: PPO

## 2017-01-31 DIAGNOSIS — I4891 Unspecified atrial fibrillation: Secondary | ICD-10-CM | POA: Diagnosis not present

## 2017-01-31 DIAGNOSIS — R000 Tachycardia, unspecified: Secondary | ICD-10-CM

## 2017-02-06 ENCOUNTER — Telehealth: Payer: Self-pay | Admitting: Pharmacist

## 2017-02-06 NOTE — Telephone Encounter (Signed)
LMOM; patient to call back.  Need to know if using Repatha. When 1st injection given? Need to repeat Lipid panel and LFTs around 6th dose.  Noted f/u appointment with Dr C on Dec/09/2016

## 2017-02-08 NOTE — Telephone Encounter (Signed)
LMOM; Patient to call back to f/u on Repatha  1st dose? Tolerating?

## 2017-02-16 ENCOUNTER — Telehealth: Payer: Self-pay | Admitting: Cardiovascular Disease

## 2017-02-16 NOTE — Telephone Encounter (Signed)
New Message     Pt calling to get holtor monitor results    [phone]

## 2017-02-16 NOTE — Telephone Encounter (Signed)
Notes recorded by Sanda Klein, MD on 02/06/2017 at 4:14 PM EDT; No unexpected findings on monitor. AFib rate is appropriately controlled  Pt notified of results. Taking all medications as ordered.

## 2017-02-27 ENCOUNTER — Other Ambulatory Visit: Payer: Self-pay | Admitting: Cardiovascular Disease

## 2017-03-27 DIAGNOSIS — N183 Chronic kidney disease, stage 3 (moderate): Secondary | ICD-10-CM | POA: Diagnosis not present

## 2017-03-30 ENCOUNTER — Encounter: Payer: Self-pay | Admitting: Cardiovascular Disease

## 2017-03-30 ENCOUNTER — Ambulatory Visit: Payer: PPO | Admitting: Cardiovascular Disease

## 2017-03-30 VITALS — BP 122/78 | HR 80 | Ht 62.0 in | Wt 177.4 lb

## 2017-03-30 DIAGNOSIS — E785 Hyperlipidemia, unspecified: Secondary | ICD-10-CM | POA: Diagnosis not present

## 2017-03-30 DIAGNOSIS — I10 Essential (primary) hypertension: Secondary | ICD-10-CM

## 2017-03-30 DIAGNOSIS — N183 Chronic kidney disease, stage 3 unspecified: Secondary | ICD-10-CM

## 2017-03-30 DIAGNOSIS — I340 Nonrheumatic mitral (valve) insufficiency: Secondary | ICD-10-CM | POA: Diagnosis not present

## 2017-03-30 DIAGNOSIS — Z7901 Long term (current) use of anticoagulants: Secondary | ICD-10-CM

## 2017-03-30 DIAGNOSIS — E782 Mixed hyperlipidemia: Secondary | ICD-10-CM | POA: Diagnosis not present

## 2017-03-30 DIAGNOSIS — E669 Obesity, unspecified: Secondary | ICD-10-CM | POA: Diagnosis not present

## 2017-03-30 DIAGNOSIS — I481 Persistent atrial fibrillation: Secondary | ICD-10-CM

## 2017-03-30 DIAGNOSIS — I2510 Atherosclerotic heart disease of native coronary artery without angina pectoris: Secondary | ICD-10-CM

## 2017-03-30 DIAGNOSIS — I4819 Other persistent atrial fibrillation: Secondary | ICD-10-CM

## 2017-03-30 LAB — LIPID PANEL
CHOL/HDL RATIO: 2.5 ratio (ref 0.0–4.4)
Cholesterol, Total: 161 mg/dL (ref 100–199)
HDL: 64 mg/dL (ref >39)
LDL Calculated: 80 mg/dL (ref 0–99)
Triglycerides: 84 mg/dL (ref 0–149)
VLDL Cholesterol Cal: 17 mg/dL (ref 5–40)

## 2017-03-30 NOTE — Progress Notes (Signed)
Cardiology Office Note    Date:  03/30/2017   ID:  Sandra Bradford, Sandra Bradford Oct 16, 1940, MRN [id number]  PCP:  Sharilyn Sites, MD  Cardiologist:   Sanda Klein, MD   No chief complaint on file.   History of Present Illness:  Sandra Bradford is a 76 y.o. female with CAD s/p CABG 2002, permanent atrial fibrillation here for follow-up.   She is generally feels well and denies problems with angina or dyspnea.  She has not been aware of any palpitations and denies dizziness or syncope she has not had any falls or any bleeding problems on chronic treatment with dose adjusted Xarelto.  She has tried numerous statins but has not been able to tolerate any of them.  She has had an excellent response to treatment with Repatha, she does think that she developed some muscle aches for 1 or 2 days after the injection, but these side effects subsequently subside.    She has chronic leg edema related to venous insufficiency that has generally been well controlled. She has chronic kidney disease stage 3 followed by Dr. Moshe Cipro.  Cath 2016: 1. Severe 3 vessel obstructive CAD 2. Patent but atretic LIMA to the LAD. The distal LAD is small and severely diseased. 3. Occluded SVG to the diagonal 4. Patent SVG to OM 5. Patent SVG to the PDA   Past Medical History:  Diagnosis Date  . A-fib ([place])   . Abnormal nuclear cardiac imaging test 05/01/2014  . Arthritis    "scattered" (06/10/2014)  . Carotid arterial disease ([place])   . Chronic bronchitis ([place])    "several times; haven't had it since I quit working in 2008" (06/10/2014)  . Chronic kidney disease, stage 3 ([place])   . Coronary artery disease   . Diverticulitis   . DVT (deep venous thrombosis) ([place]) 2001   "related to sitting around when I broke my foot"  . Heart murmur   . History of blood transfusion 01/2001   "related to OHS"   . Hyperlipemia   . PAD (peripheral artery disease) ([place])   . Pneumonia ~ 2010 X 2  . PONV (postoperative nausea and  vomiting)   . 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 Systemic hypertension     Past Surgical History:  Procedure Laterality Date  . CARDIAC CATHETERIZATION  2002; 2005  . CARDIOVERSION N/A 05/08/2014   Procedure: CARDIOVERSION;  Surgeon: Sanda Klein, MD;  Location: [facility];  Service: Cardiovascular;  Laterality: N/A;  . CHOLECYSTECTOMY N/A 01/17/2013   Procedure: LAPAROSCOPIC CHOLECYSTECTOMY;  Surgeon: Donato Heinz, MD;  Location: [facility];  Service: General;  Laterality: N/A;  . COLONOSCOPY  July 2007   Dr. Oneida Alar: ascending and sigmoid colon diverticula  . COLONOSCOPY  Dec 2011   Dr. Oneida Alar: ischemic colitis. CTA performed with patent celiac, SMA, and IMA  . COLONOSCOPY N/A 08/06/2013   Dr. Gala Romney: colonic diverticulosis, internal hemorrhoids   . COLONOSCOPY N/A 08/22/2016   Dr. Gala Romney: non-bleeding internal hemorrhoids, diverticulosis in sigmoid colon.   . CORONARY ARTERY BYPASS GRAFT  01/2001   LIMA TO LAD,SVG TO DIAGONAL,SVG TO MARGINAL,SVG SEQUENTALLY TO THE PD & PL VESSEL  . KNEE ARTHROSCOPY Right ~ 1985  . LEFT HEART CATHETERIZATION WITH CORONARY/GRAFT ANGIOGRAM N/A 06/11/2014   Procedure: LEFT HEART CATHETERIZATION WITH Beatrix Fetters;  Surgeon: Peter M Martinique, MD;  Location: [facility];  Service: Cardiovascular;  Laterality: N/A;  . MOHS SURGERY  ~ 04/2013   upper  lip  . NM MYOCAR PERF WALL MOTION  08/18/2008   No significant ischemia  . TONSILLECTOMY  1953  . TUBAL LIGATION  ~ 1973  . US ECHOCARDIOGRAPHY  07/04/2008   trace MR,mild TR  . VAGINAL HYSTERECTOMY  1980    Current Medications: Outpatient Medications Prior to Visit  Medication Sig Dispense Refill  . acetaminophen (TYLENOL) 325 MG tablet Take 650 mg by mouth every 6 (six) hours as needed for mild pain, moderate pain, fever or headache.     . Evolocumab (REPATHA SURECLICK) 782 MG/ML SOAJ Inject 140 mg into the skin every 14 (fourteen) days. 2 pen 11  . Flaxseed, Linseed, (FLAX SEED  OIL PO) Take 1 capsule by mouth daily.    . furosemide (LASIX) 20 MG tablet TAKE (1) TABLET BY MOUTH ONCE DAILY. 90 tablet 3  . metoprolol (LOPRESSOR) 50 MG tablet Take 1 tablet (50 mg total) by mouth 2 (two) times daily. 180 tablet 3  . Multiple Vitamin (MULITIVITAMIN WITH MINERALS) TABS Take 1 tablet by mouth daily.    . quinapril (ACCUPRIL) 5 MG tablet Take 0.5 tablets (2.5 mg total) by mouth daily. 45 tablet 3  . Rivaroxaban (XARELTO) 15 MG TABS tablet Take 1 tablet (15 mg total) by mouth daily with supper. 90 tablet 3  . cephALEXin (KEFLEX) 500 MG capsule Take 1 capsule (500 mg total) by mouth 4 (four) times daily. 28 capsule 0  . cholecalciferol (VITAMIN D) 1000 units tablet Take 1,000 Units by mouth 2 (two) times daily.    . Diclofenac Sodium CR 100 MG 24 hr tablet Take 1 tablet (100 mg total) by mouth daily. 30 tablet 0   No facility-administered medications prior to visit.      Allergies:   Codeine and Statins   Social History   Socioeconomic History  . Marital status: Divorced    Spouse name: None  . Number of children: None  . Years of education: None  . Highest education level: None  Social Needs  . Financial resource strain: None  . Food insecurity - worry: None  . Food insecurity - inability: None  . Transportation needs - medical: None  . Transportation needs - non-medical: None  Occupational History  . None  Tobacco Use  . Smoking status: Never Smoker  . Smokeless tobacco: Never Used  Substance and Sexual Activity  . Alcohol use: Yes    Comment: 06/10/2014 "might have a beer after mowing yard or a drink at holidays"  . Drug use: No  . Sexual activity: No  Other Topics Concern  . None  Social History Narrative  . None     Family History:  The patient's family history includes Congestive Heart Failure in her father; Heart attack in her father and mother.   ROS:   Please see the history of present illness.    ROS All other systems reviewed and are  negative.   PHYSICAL EXAM:   VS:  BP 122/78 (BP Location: Left Arm, Patient Position: Sitting, Cuff Size: Normal)   Pulse 80   Ht 5\' 2"  (1.575 m)   Wt 177 lb 6.4 oz (80.5 kg)   LMP 04/25/1978   BMI 32.45 kg/m     General: Alert, oriented x3, no distress, mildly obese Head: no evidence of trauma, PERRL, EOMI, no exophtalmos or lid lag, no myxedema, no xanthelasma; normal ears, nose and oropharynx Neck: normal jugular venous pulsations and no hepatojugular reflux; brisk carotid pulses without delay and no carotid bruits Chest:  clear to auscultation, no signs of consolidation by percussion or palpation, normal fremitus, symmetrical and full respiratory excursions Cardiovascular: normal position and quality of the apical impulse, regular rhythm, normal first and second heart sounds, 2/6 early peaking systolic ejection murmur in the aortic focus, 2/6 holosystolic murmur at the apex, no diastolic murmurs, rubs or gallops Abdomen: no tenderness or distention, no masses by palpation, no abnormal pulsatility or arterial bruits, normal bowel sounds, no hepatosplenomegaly Extremities: no clubbing, cyanosis or edema; 2+ radial, ulnar and brachial pulses bilaterally; 2+ right femoral, posterior tibial and dorsalis pedis pulses; 2+ left femoral, posterior tibial and dorsalis pedis pulses; no subclavian or femoral bruits Neurological: grossly nonfocal Psych: Normal mood and affect   Wt Readings from Last 3 Encounters:  03/30/17 177 lb 6.4 oz (80.5 kg)  01/16/17 182 lb (82.6 kg)  01/12/17 177 lb (80.3 kg)      Studies/Labs Reviewed:   EKG:  EKG is not ordered today.   Recent Labs: 05/20/2016: ALT 21 01/12/2017: BUN 20; Creatinine, Ser 1.10; Hemoglobin 13.8; Platelets 190; Potassium 4.1; Sodium 136   Lipid Panel    Component Value Date/Time   CHOL 161 03/30/2017 1105   TRIG 84 03/30/2017 1105   HDL 64 03/30/2017 1105   CHOLHDL 2.5 03/30/2017 1105   CHOLHDL 4.5 05/20/2016 0842   VLDL 22  05/20/2016 0842   LDLCALC 80 03/30/2017 1105     ASSESSMENT:    1. Dyslipidemia   2. Persistent atrial fibrillation ([place])   3. Chronic anticoagulation   4. Coronary artery disease involving native coronary artery of native heart without angina pectoris   5. Moderate mitral insufficiency   6. Chronic kidney disease, stage 3 (H[facility])   7. Essential hypertension   8. Obesity (BMI 30.0-34.9)      PLAN:  In order of problems listed above: 1. AFib: She had exertional chest discomfort and exertional dyspnea when her rate was poorly controlled, but these complaints have abated with higher dose of beta-blocker. CHADSVasc at least 5 (age 55, gender, CAD, hypertension). 2. Anticoagulated with Xarelto (dose adjusted for low GFR).  No bleeding complications. 3. CAD s/p CABG: Angina free, asymptomatic 4. Moderate MR: Unlikely to be hemodynamically significant. Could be the consequence of remote rheumatic heart disease.  No symptoms to suggest congestive heart failure. 5. CKD: estimated GFR 35-40 mL/min, on lower dose Xarelto.  Her most recent creatinine was actually 1.1, corresponding to a GFR of 47, but we will not make any adjustment in her Xarelto dose at this time. 6. HLP: Excellent response to PCSK9 inhibitor.  She was intolerant to all statins in the past. 7. HTN: Good control. 8. Obesity: Continue to encourage weight loss.    Medication Adjustments/Labs and Tests Ordered: Current medicines are reviewed at length with the patient today.  Concerns regarding medicines are outlined above.  Medication changes, Labs and Tests ordered today are listed in the Patient Instructions below. There are no Patient Instructions on file for this visit.   Signed, Sanda Klein, MD  03/30/2017 10:33 AM    [facility] [address], [place], [place]  [address] Phone: [phone]; Fax: [phone]

## 2017-03-30 NOTE — Patient Instructions (Signed)
Dr Sallyanne Kuster recommends that you continue on your current medications as directed. Please refer to the Current Medication list given to you today.  Your physician recommends that you return for lab work TODAY.  Dr Sallyanne Kuster recommends that you schedule a follow-up appointment in 12 months. You will receive a reminder letter in the mail two months in advance. If you don't receive a letter, please call our office to schedule the follow-up appointment.  If you need a refill on your cardiac medications before your next appointment, please call your pharmacy.

## 2017-03-31 DIAGNOSIS — Z0001 Encounter for general adult medical examination with abnormal findings: Secondary | ICD-10-CM | POA: Diagnosis not present

## 2017-03-31 DIAGNOSIS — E782 Mixed hyperlipidemia: Secondary | ICD-10-CM | POA: Diagnosis not present

## 2017-03-31 DIAGNOSIS — Z1389 Encounter for screening for other disorder: Secondary | ICD-10-CM | POA: Diagnosis not present

## 2017-03-31 DIAGNOSIS — I10 Essential (primary) hypertension: Secondary | ICD-10-CM | POA: Diagnosis not present

## 2017-03-31 DIAGNOSIS — I2510 Atherosclerotic heart disease of native coronary artery without angina pectoris: Secondary | ICD-10-CM | POA: Diagnosis not present

## 2017-03-31 DIAGNOSIS — I4891 Unspecified atrial fibrillation: Secondary | ICD-10-CM | POA: Diagnosis not present

## 2017-03-31 DIAGNOSIS — N183 Chronic kidney disease, stage 3 (moderate): Secondary | ICD-10-CM | POA: Diagnosis not present

## 2017-03-31 DIAGNOSIS — E6609 Other obesity due to excess calories: Secondary | ICD-10-CM | POA: Diagnosis not present

## 2017-03-31 DIAGNOSIS — R7309 Other abnormal glucose: Secondary | ICD-10-CM | POA: Diagnosis not present

## 2017-04-06 ENCOUNTER — Telehealth: Payer: Self-pay | Admitting: Pharmacist

## 2017-04-06 NOTE — Telephone Encounter (Signed)
Results discussed with patient.  New refill for repatha due this week.  Patient ids to return completed AMGEN safety net paperwork for patient assistance.

## 2017-05-23 ENCOUNTER — Other Ambulatory Visit (HOSPITAL_COMMUNITY): Payer: Self-pay | Admitting: Family Medicine

## 2017-05-23 ENCOUNTER — Ambulatory Visit (HOSPITAL_COMMUNITY)
Admission: RE | Admit: 2017-05-23 | Discharge: 2017-05-23 | Disposition: A | Discharge status: Home / Self Care | Payer: PPO | Source: Ambulatory Visit | Attending: Family Medicine | Admitting: Family Medicine

## 2017-05-23 DIAGNOSIS — M7989 Other specified soft tissue disorders: Secondary | ICD-10-CM | POA: Diagnosis not present

## 2017-05-23 DIAGNOSIS — Z6833 Body mass index (BMI) 33.0-33.9, adult: Secondary | ICD-10-CM | POA: Diagnosis not present

## 2017-05-23 DIAGNOSIS — E6609 Other obesity due to excess calories: Secondary | ICD-10-CM | POA: Insufficient documentation

## 2017-05-23 DIAGNOSIS — M79662 Pain in left lower leg: Secondary | ICD-10-CM

## 2017-05-23 DIAGNOSIS — A46 Erysipelas: Secondary | ICD-10-CM | POA: Diagnosis not present

## 2017-05-23 DIAGNOSIS — R600 Localized edema: Secondary | ICD-10-CM | POA: Diagnosis not present

## 2017-06-01 ENCOUNTER — Ambulatory Visit (INDEPENDENT_AMBULATORY_CARE_PROVIDER_SITE_OTHER): Payer: PPO

## 2017-06-01 ENCOUNTER — Encounter (INDEPENDENT_AMBULATORY_CARE_PROVIDER_SITE_OTHER): Payer: Self-pay | Admitting: Orthopaedic Surgery

## 2017-06-01 ENCOUNTER — Ambulatory Visit (INDEPENDENT_AMBULATORY_CARE_PROVIDER_SITE_OTHER): Payer: PPO | Admitting: Orthopaedic Surgery

## 2017-06-01 DIAGNOSIS — M7742 Metatarsalgia, left foot: Secondary | ICD-10-CM

## 2017-06-01 NOTE — Progress Notes (Signed)
Office Visit Note   Patient: Sandra Bradford           Date of Birth: 10-20-1940           MRN: [id number] Visit Date: 06/01/2017              Requested by: Sharilyn Sites, [place] [address], [place] [address] PCP: Sharilyn Sites, MD   Assessment & Plan: Visit Diagnoses:  1. Metatarsalgia of left foot     Plan: Impression is left foot metatarsalgia.  This point we are will put Danee in a Cam walker weightbearing as tolerated.  She will elevate for swelling.  I have given her samples of pen said.  She will follow-up with [place] in 3-4 weeks time for repeat evaluation.  Follow-Up Instructions: Return in about 4 weeks (around 06/29/2017).   Orders:  Orders Placed This Encounter  Procedures  . XR Foot Complete Left  . XR Ankle 2 Views Left   No orders of the defined types were placed in this encounter.     Procedures: No procedures performed   Clinical Data: No additional findings.   Subjective: Chief Complaint  Patient presents with  . Left Foot - Pain, Edema    HPI rucks is a pleasant 77 year old female who presents our clinic today with left foot pain.  This began about 3 weeks ago without any known injury or change in activity.  The pain she has had is over the fifth metatarsal shaft as well as majority of metatarsal heads and her big toe.  She describes this as a constant pain worse with weightbearing.  She is tried Tylenol without relief of symptoms.  No numbness tingling burning.  Of note she does have significant pitting edema to the left lower extremity due to vascular issues.  She has had a recent Doppler ultrasound which was negative.  She is on Boniva for her osteoporosis.  Review of Systems as detailed in HPI.  All others are negative.   Objective: Vital Signs: LMP 04/25/1978   Physical Exam well-developed well-nourished female no acute distress.  Alert oriented x3.  Ortho Exam examination of her left foot and ankle reveals 2+ pitting edema throughout.   Calf soft nontender.  Normal range of motion of the ankle.  Marked tenderness over the first MTP joint, fifth metatarsal shaft, second third and fourth metatarsal heads.  She is neurovascular intact distally.  Specialty Comments:  No specialty comments available.  Imaging: Xr Ankle 2 Views Left  Result Date: 06/01/2017 X-rays of the left foot/ankle are negative for fracture.  She does have a significant amount of arthritis throughout.  She does have a retrocalcaneal and plantar fascial attachment spur.    PMFS History: Patient Active Problem List   Diagnosis Date Noted  . Metatarsalgia of left foot 06/01/2017  . Moderate mitral insufficiency 11/03/2016  . Rectal bleeding 08/18/2016  . Chronic anticoagulation 06/11/2014  . Acute on chronic renal insufficiency 06/10/2014  . Persistent atrial fibrillation ([place])   . Abnormal nuclear cardiac imaging test 05/01/2014  . Dyspnea on exertion 03/03/2014  . Chronic kidney disease, stage 3 ([place]) 02/10/2014  . Diverticulitis of colon (without mention of hemorrhage)(562.11) 07/16/2013  . Systolic murmur 37/16/9678  . Preoperative cardiovascular examination 01/15/2013  . CAD-CABG 2002, cath 06/11/14 11/10/2012  . PAD (peripheral artery disease) ([place]) 11/10/2012  . Dyslipidemia 11/10/2012  . Statin intolerance 11/10/2012  . HTN (hypertension) 11/10/2012  . Obesity (BMI 30.0-34.9) 11/10/2012  . Venous insufficiency, peripheral 11/10/2012  Past Medical History:  Diagnosis Date  . A-fib ([place])   . Abnormal nuclear cardiac imaging test 05/01/2014  . Arthritis    "scattered" (06/10/2014)  . Carotid arterial disease ([place])   . Chronic bronchitis ([place])    "several times; haven't had it since I quit working in 2008" (06/10/2014)  . Chronic kidney disease, stage 3 ([place])   . Coronary artery disease   . Diverticulitis   . DVT (deep venous thrombosis) ([place]) 2001   "related to sitting around when I broke my foot"  . Heart murmur   . History of blood  transfusion 01/2001   "related to OHS"   . Hyperlipemia   . PAD (peripheral artery disease) ([place])   . Pneumonia ~ 2010 X 2  . PONV (postoperative nausea and vomiting)   . 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 Systemic hypertension     Family History  Problem Relation Age of Onset  . Heart attack Mother   . Heart attack Father   . Congestive Heart Failure Father   . Colon cancer Neg Hx     Past Surgical History:  Procedure Laterality Date  . CARDIAC CATHETERIZATION  2002; 2005  . CARDIOVERSION N/A 05/08/2014   Procedure: CARDIOVERSION;  Surgeon: Sanda Klein, MD;  Location: [facility];  Service: Cardiovascular;  Laterality: N/A;  . CHOLECYSTECTOMY N/A 01/17/2013   Procedure: LAPAROSCOPIC CHOLECYSTECTOMY;  Surgeon: Donato Heinz, MD;  Location: [facility];  Service: General;  Laterality: N/A;  . COLONOSCOPY  July 2007   Dr. Oneida Alar: ascending and sigmoid colon diverticula  . COLONOSCOPY  Dec 2011   Dr. Oneida Alar: ischemic colitis. CTA performed with patent celiac, SMA, and IMA  . COLONOSCOPY N/A 08/06/2013   Dr. Gala Romney: colonic diverticulosis, internal hemorrhoids   . COLONOSCOPY N/A 08/22/2016   Dr. Gala Romney: non-bleeding internal hemorrhoids, diverticulosis in sigmoid colon.   . CORONARY ARTERY BYPASS GRAFT  01/2001   LIMA TO LAD,SVG TO DIAGONAL,SVG TO MARGINAL,SVG SEQUENTALLY TO THE PD & PL VESSEL  . KNEE ARTHROSCOPY Right ~ 1985  . LEFT HEART CATHETERIZATION WITH CORONARY/GRAFT ANGIOGRAM N/A 06/11/2014   Procedure: LEFT HEART CATHETERIZATION WITH Beatrix Fetters;  Surgeon: Peter M Martinique, MD;  Location[facility] LAB;  Service: Cardiovascular;  Laterality: N/A;  . MOHS SURGERY  ~ 04/2013   upper lip  . NM MYOCAR PERF WALL MOTION  08/18/2008   No significant ischemia  . TONSILLECTOMY  1953  . TUBAL LIGATION  ~ 1973  . US ECHOCARDIOGRAPHY  07/04/2008   trace MR,mild TR  . VAGINAL HYSTERECTOMY  1980   Social History   Occupational History  . Not on file    Tobacco Use  . Smoking status: Never Smoker  . Smokeless tobacco: Never Used  Substance and Sexual Activity  . Alcohol use: Yes    Comment: 06/10/2014 "might have a beer after mowing yard or a drink at holidays"  . Drug use: No  . Sexual activity: No

## 2017-06-05 DIAGNOSIS — M2550 Pain in unspecified joint: Secondary | ICD-10-CM | POA: Diagnosis not present

## 2017-06-05 DIAGNOSIS — R5383 Other fatigue: Secondary | ICD-10-CM | POA: Diagnosis not present

## 2017-06-05 DIAGNOSIS — M79672 Pain in left foot: Secondary | ICD-10-CM | POA: Diagnosis not present

## 2017-06-05 DIAGNOSIS — M7989 Other specified soft tissue disorders: Secondary | ICD-10-CM | POA: Diagnosis not present

## 2017-06-05 DIAGNOSIS — E559 Vitamin D deficiency, unspecified: Secondary | ICD-10-CM | POA: Diagnosis not present

## 2017-06-05 DIAGNOSIS — M109 Gout, unspecified: Secondary | ICD-10-CM | POA: Diagnosis not present

## 2017-06-07 ENCOUNTER — Ambulatory Visit (INDEPENDENT_AMBULATORY_CARE_PROVIDER_SITE_OTHER): Payer: Self-pay | Admitting: Orthopedic Surgery

## 2017-06-12 DIAGNOSIS — T560X1A Toxic effect of lead and its compounds, accidental (unintentional), initial encounter: Secondary | ICD-10-CM | POA: Diagnosis not present

## 2017-06-12 DIAGNOSIS — E559 Vitamin D deficiency, unspecified: Secondary | ICD-10-CM | POA: Diagnosis not present

## 2017-06-12 DIAGNOSIS — M7989 Other specified soft tissue disorders: Secondary | ICD-10-CM | POA: Diagnosis not present

## 2017-06-12 DIAGNOSIS — M79672 Pain in left foot: Secondary | ICD-10-CM | POA: Diagnosis not present

## 2017-06-29 ENCOUNTER — Ambulatory Visit (INDEPENDENT_AMBULATORY_CARE_PROVIDER_SITE_OTHER): Payer: PPO | Admitting: Orthopaedic Surgery

## 2017-07-06 ENCOUNTER — Telehealth: Payer: Self-pay | Admitting: Cardiovascular Disease

## 2017-07-06 NOTE — Telephone Encounter (Signed)
New message   *STAT* If patient is at the pharmacy, call can be transferred to refill team.   1. Which medications need to be refilled? (please list name of each medication and dose if known) quinapril (ACCUPRIL) 5 MG tablet, furosemide (LASIX) 20 MG tablet, metoprolol (LOPRESSOR) 50 MG tablet, metoprolol (LOPRESSOR) 50 MG tablet  2. Which pharmacy/location (including street and city if local pharmacy) is medication to be sent to? [facility] [phone] - [place], [address] AT [address]  3. Do they need a 30 day or 90 day supply?

## 2017-07-07 MED ORDER — QUINAPRIL HCL 5 MG PO TABS: 2.5000 mg | ORAL_TABLET | Freq: Every day | ORAL | 3 refills | Status: DC

## 2017-07-07 MED ORDER — FUROSEMIDE 20 MG PO TABS: 20.0000 mg | ORAL_TABLET | Freq: Every day | ORAL | 3 refills | Status: DC

## 2017-07-07 MED ORDER — METOPROLOL TARTRATE 50 MG PO TABS: 50.0000 mg | ORAL_TABLET | Freq: Two times a day (BID) | ORAL | 3 refills | Status: DC

## 2017-07-07 MED ORDER — RIVAROXABAN 15 MG PO TABS: 15.0000 mg | ORAL_TABLET | Freq: Every day | ORAL | 1 refills | Status: DC

## 2017-07-10 ENCOUNTER — Telehealth: Payer: Self-pay | Admitting: Pharmacist

## 2017-07-10 NOTE — Telephone Encounter (Signed)
AMGEN approved Repatha until Dec/31/2019.  LMOM for patient to place Repatha order as soon as possible.

## 2017-09-04 DIAGNOSIS — E559 Vitamin D deficiency, unspecified: Secondary | ICD-10-CM | POA: Diagnosis not present

## 2017-09-04 DIAGNOSIS — M79672 Pain in left foot: Secondary | ICD-10-CM | POA: Diagnosis not present

## 2017-09-04 DIAGNOSIS — M7989 Other specified soft tissue disorders: Secondary | ICD-10-CM | POA: Diagnosis not present

## 2017-10-02 DIAGNOSIS — M79672 Pain in left foot: Secondary | ICD-10-CM | POA: Diagnosis not present

## 2017-10-02 DIAGNOSIS — E559 Vitamin D deficiency, unspecified: Secondary | ICD-10-CM | POA: Diagnosis not present

## 2017-10-18 DIAGNOSIS — J069 Acute upper respiratory infection, unspecified: Secondary | ICD-10-CM | POA: Diagnosis not present

## 2017-10-18 DIAGNOSIS — Z1389 Encounter for screening for other disorder: Secondary | ICD-10-CM | POA: Diagnosis not present

## 2017-10-18 DIAGNOSIS — Z6832 Body mass index (BMI) 32.0-32.9, adult: Secondary | ICD-10-CM | POA: Diagnosis not present

## 2017-10-18 DIAGNOSIS — M549 Dorsalgia, unspecified: Secondary | ICD-10-CM | POA: Diagnosis not present

## 2017-10-18 DIAGNOSIS — E6609 Other obesity due to excess calories: Secondary | ICD-10-CM | POA: Diagnosis not present

## 2017-10-18 DIAGNOSIS — B349 Viral infection, unspecified: Secondary | ICD-10-CM | POA: Diagnosis not present

## 2017-11-16 DIAGNOSIS — Z6831 Body mass index (BMI) 31.0-31.9, adult: Secondary | ICD-10-CM | POA: Diagnosis not present

## 2017-11-16 DIAGNOSIS — Z1231 Encounter for screening mammogram for malignant neoplasm of breast: Secondary | ICD-10-CM | POA: Diagnosis not present

## 2017-11-16 DIAGNOSIS — Z01419 Encounter for gynecological examination (general) (routine) without abnormal findings: Secondary | ICD-10-CM | POA: Diagnosis not present

## 2018-01-09 ENCOUNTER — Other Ambulatory Visit: Payer: Self-pay | Admitting: *Deleted

## 2018-01-09 MED ORDER — RIVAROXABAN 15 MG PO TABS: 15.0000 mg | ORAL_TABLET | Freq: Every day | ORAL | 1 refills | Status: DC

## 2018-01-15 DIAGNOSIS — Z0001 Encounter for general adult medical examination with abnormal findings: Secondary | ICD-10-CM | POA: Diagnosis not present

## 2018-01-15 DIAGNOSIS — I2510 Atherosclerotic heart disease of native coronary artery without angina pectoris: Secondary | ICD-10-CM | POA: Diagnosis not present

## 2018-01-15 DIAGNOSIS — E6609 Other obesity due to excess calories: Secondary | ICD-10-CM | POA: Diagnosis not present

## 2018-01-15 DIAGNOSIS — R7309 Other abnormal glucose: Secondary | ICD-10-CM | POA: Diagnosis not present

## 2018-01-15 DIAGNOSIS — Z23 Encounter for immunization: Secondary | ICD-10-CM | POA: Diagnosis not present

## 2018-01-15 DIAGNOSIS — Z6832 Body mass index (BMI) 32.0-32.9, adult: Secondary | ICD-10-CM | POA: Diagnosis not present

## 2018-01-15 DIAGNOSIS — E782 Mixed hyperlipidemia: Secondary | ICD-10-CM | POA: Diagnosis not present

## 2018-01-15 DIAGNOSIS — Z1389 Encounter for screening for other disorder: Secondary | ICD-10-CM | POA: Diagnosis not present

## 2018-01-15 DIAGNOSIS — I4891 Unspecified atrial fibrillation: Secondary | ICD-10-CM | POA: Diagnosis not present

## 2018-01-17 DIAGNOSIS — N183 Chronic kidney disease, stage 3 (moderate): Secondary | ICD-10-CM | POA: Diagnosis not present

## 2018-01-17 DIAGNOSIS — M109 Gout, unspecified: Secondary | ICD-10-CM | POA: Diagnosis not present

## 2018-01-17 DIAGNOSIS — Z6835 Body mass index (BMI) 35.0-35.9, adult: Secondary | ICD-10-CM | POA: Diagnosis not present

## 2018-01-17 DIAGNOSIS — I129 Hypertensive chronic kidney disease with stage 1 through stage 4 chronic kidney disease, or unspecified chronic kidney disease: Secondary | ICD-10-CM | POA: Diagnosis not present

## 2018-02-19 ENCOUNTER — Telehealth: Payer: Self-pay | Admitting: Pharmacist

## 2018-02-19 DIAGNOSIS — E785 Hyperlipidemia, unspecified: Secondary | ICD-10-CM

## 2018-02-19 NOTE — Telephone Encounter (Signed)
AMGEN SafetyNet renewal paperwork for 2020 mailed to patient with order for repeat Lipid profile.

## 2018-03-02 DIAGNOSIS — D485 Neoplasm of uncertain behavior of skin: Secondary | ICD-10-CM | POA: Diagnosis not present

## 2018-03-02 DIAGNOSIS — L821 Other seborrheic keratosis: Secondary | ICD-10-CM | POA: Diagnosis not present

## 2018-03-02 DIAGNOSIS — Z85828 Personal history of other malignant neoplasm of skin: Secondary | ICD-10-CM | POA: Diagnosis not present

## 2018-03-02 DIAGNOSIS — L814 Other melanin hyperpigmentation: Secondary | ICD-10-CM | POA: Diagnosis not present

## 2018-03-02 DIAGNOSIS — L578 Other skin changes due to chronic exposure to nonionizing radiation: Secondary | ICD-10-CM | POA: Diagnosis not present

## 2018-03-02 DIAGNOSIS — D225 Melanocytic nevi of trunk: Secondary | ICD-10-CM | POA: Diagnosis not present

## 2018-03-02 DIAGNOSIS — D1801 Hemangioma of skin and subcutaneous tissue: Secondary | ICD-10-CM | POA: Diagnosis not present

## 2018-03-09 DIAGNOSIS — C44319 Basal cell carcinoma of skin of other parts of face: Secondary | ICD-10-CM | POA: Diagnosis not present

## 2018-03-09 DIAGNOSIS — C4441 Basal cell carcinoma of skin of scalp and neck: Secondary | ICD-10-CM | POA: Diagnosis not present

## 2018-03-09 DIAGNOSIS — D485 Neoplasm of uncertain behavior of skin: Secondary | ICD-10-CM | POA: Diagnosis not present

## 2018-03-27 ENCOUNTER — Telehealth: Payer: Self-pay

## 2018-03-27 NOTE — Telephone Encounter (Signed)
Pt daughter seen by Almyra Deforest, PA today and sts that the patient is in need of Xareltomg samples. Call and spoke with pt. Pt sts that she is in the donut whole and will not be able to afford her Xarelto this month. Samples of Xarelt 15mg  (2 bottles) given to pt daughter to be given to the pt. Pt voiced appreciation for the assistance.

## 2018-04-02 DIAGNOSIS — E782 Mixed hyperlipidemia: Secondary | ICD-10-CM | POA: Diagnosis not present

## 2018-04-02 DIAGNOSIS — E785 Hyperlipidemia, unspecified: Secondary | ICD-10-CM | POA: Diagnosis not present

## 2018-04-03 LAB — LIPID PANEL
CHOL/HDL RATIO: 2.9 ratio (ref 0.0–4.4)
Cholesterol, Total: 158 mg/dL (ref 100–199)
HDL: 54 mg/dL (ref >39)
LDL CALC: 86 mg/dL (ref 0–99)
TRIGLYCERIDES: 89 mg/dL (ref 0–149)
VLDL CHOLESTEROL CAL: 18 mg/dL (ref 5–40)

## 2018-04-05 ENCOUNTER — Ambulatory Visit: Payer: PPO | Admitting: Cardiovascular Disease

## 2018-04-05 ENCOUNTER — Encounter: Payer: Self-pay | Admitting: Cardiovascular Disease

## 2018-04-05 VITALS — BP 106/72 | HR 81 | Ht 62.0 in | Wt 169.0 lb

## 2018-04-05 DIAGNOSIS — E7800 Pure hypercholesterolemia, unspecified: Secondary | ICD-10-CM | POA: Diagnosis not present

## 2018-04-05 DIAGNOSIS — I051 Rheumatic mitral insufficiency: Secondary | ICD-10-CM | POA: Diagnosis not present

## 2018-04-05 DIAGNOSIS — I10 Essential (primary) hypertension: Secondary | ICD-10-CM

## 2018-04-05 DIAGNOSIS — I4821 Permanent atrial fibrillation: Secondary | ICD-10-CM

## 2018-04-05 DIAGNOSIS — Z7901 Long term (current) use of anticoagulants: Secondary | ICD-10-CM | POA: Diagnosis not present

## 2018-04-05 DIAGNOSIS — E669 Obesity, unspecified: Secondary | ICD-10-CM

## 2018-04-05 DIAGNOSIS — N183 Chronic kidney disease, stage 3 unspecified: Secondary | ICD-10-CM

## 2018-04-05 DIAGNOSIS — I25708 Atherosclerosis of coronary artery bypass graft(s), unspecified, with other forms of angina pectoris: Secondary | ICD-10-CM

## 2018-04-05 NOTE — Patient Instructions (Signed)
Medication Instructions:  Dr Croitoru recommends that you continue on your current medications as directed. Please refer to the Current Medication list given to you today.  If you need a refill on your cardiac medications before your next appointment, please call your pharmacy.   Follow-Up: At [facility], you and your health needs are our priority.  As part of our continuing mission to provide you with exceptional heart care, we have created designated Provider Care Teams.  These Care Teams include your primary Cardiologist (physician) and Advanced Practice Providers (APPs -  Physician Assistants and Nurse Practitioners) who all work together to provide you with the care you need, when you need it. You will need a follow up appointment in 12 months. You may see Mihai Croitoru, MD or one of the following Advanced Practice Providers on your designated Care Team: Hao Meng, PA-C . Angela Duke, PA-C . You will receive a reminder letter in the mail two months in advance. If you don't receive a letter, please call our office to schedule the follow-up appointment. 

## 2018-04-06 DIAGNOSIS — E669 Obesity, unspecified: Secondary | ICD-10-CM | POA: Insufficient documentation

## 2018-04-06 DIAGNOSIS — I4821 Permanent atrial fibrillation: Secondary | ICD-10-CM | POA: Insufficient documentation

## 2018-04-06 DIAGNOSIS — I25708 Atherosclerosis of coronary artery bypass graft(s), unspecified, with other forms of angina pectoris: Secondary | ICD-10-CM | POA: Insufficient documentation

## 2018-04-06 DIAGNOSIS — E7800 Pure hypercholesterolemia, unspecified: Secondary | ICD-10-CM | POA: Insufficient documentation

## 2018-04-06 NOTE — Progress Notes (Signed)
Ling in the left leg where she had her saphenectomy's.  She had a skin cancer removed   Cardiology Office Note    Date:  04/06/2018   ID:  Sandra Bradford, DOB [birth year], MRN [id number]  PCP:  Sharilyn Sites, MD  Cardiologist:   Sanda Klein, MD   Chief Complaint  Patient presents with  . Follow-up    12 months.  . Edema    Legs, feet, and ankles.    History of Present Illness:  Sandra Bradford is a 77 y.o. female with CAD s/p CABG 2002, permanent atrial fibrillation here for follow-up.   She has had a good year from a medical point of view without need for hospitalization or any acute illnesses.  She continues to have swelling in the lower left leg where she had her saphenectomy scar.  She had a skin cancer removed and that took over 3 months before it healed because of the swelling.  She has prominent varicose veins bilaterally.  She is not aware of palpitations although she is in permanent atrial fibrillation.  She has not had problems with dyspnea with exertion, orthopnea, PND, major weight changes, falls, injuries, bleeding, focal neurological events, angina at rest or with activity.  She denies dizziness and syncope.  She is tolerating lipid-lowering therapy with Repatha well and has excellent lab results.  Although her LDL is not below the target of 70, it has decreased more than 50% from baseline.  Before that we tried virtually all available statins without success due to myalgia.  She has chronic kidney disease stage 3 followed by Dr. Moshe Cipro.  Cath 2016: 1. Severe 3 vessel obstructive CAD 2. Patent but atretic LIMA to the LAD. The distal LAD is small and severely diseased. 3. Occluded SVG to the diagonal 4. Patent SVG to OM 5. Patent SVG to the PDA   Past Medical History:  Diagnosis Date  . A-fib ([place])   . Abnormal nuclear cardiac imaging test 05/01/2014  . Arthritis    "scattered" (06/10/2014)  . Carotid arterial disease ([place])   . Chronic bronchitis ([place])    "several times; haven't had it since I quit working in 2008" (06/10/2014)  . Chronic kidney disease, stage 3 ([place])   . Coronary artery disease   . Diverticulitis   . DVT (deep venous thrombosis) ([place]) 2001   "related to sitting around when I broke my foot"  . Heart murmur   . History of blood transfusion 01/2001   "related to OHS"   . Hyperlipemia   . PAD (peripheral artery disease) ([place])   . Pneumonia ~ 2010 X 2  . PONV (postoperative nausea and vomiting)   . 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 Systemic hypertension     Past Surgical History:  Procedure Laterality Date  . CARDIAC CATHETERIZATION  2002; 2005  . CARDIOVERSION N/A 05/08/2014   Procedure: CARDIOVERSION;  Surgeon: Sanda Klein, MD;  Location: [facility];  Service: Cardiovascular;  Laterality: N/A;  . CHOLECYSTECTOMY N/A 01/17/2013   Procedure: LAPAROSCOPIC CHOLECYSTECTOMY;  Surgeon: Donato Heinz, MD;  Location: [facility];  Service: General;  Laterality: N/A;  . COLONOSCOPY  July 2007   Dr. Oneida Alar: ascending and sigmoid colon diverticula  . COLONOSCOPY  Dec 2011   Dr. Oneida Alar: ischemic colitis. CTA performed with patent celiac, SMA, and IMA  . COLONOSCOPY N/A 08/06/2013   Dr. Gala Romney: colonic diverticulosis, internal hemorrhoids   . COLONOSCOPY N/A 08/22/2016   Dr. Gala Romney:  non-bleeding internal hemorrhoids, diverticulosis in sigmoid colon.   . CORONARY ARTERY BYPASS GRAFT  01/2001   LIMA TO LAD,SVG TO DIAGONAL,SVG TO MARGINAL,SVG SEQUENTALLY TO THE PD & PL VESSEL  . KNEE ARTHROSCOPY Right ~ 1985  . LEFT HEART CATHETERIZATION WITH CORONARY/GRAFT ANGIOGRAM N/A 06/11/2014   Procedure: LEFT HEART CATHETERIZATION WITH Beatrix Fetters;  Surgeon: Peter M Martinique, MD;  Location[facility] C[facility] LAB;  Service: Cardiovascular;  Laterality: N/A;  . MOHS SURGERY  ~ 04/2013   upper lip  . NM MYOCAR PERF WALL MOTION  08/18/2008   No significant ischemia  . TONSILLECTOMY  1953  . TUBAL LIGATION  ~ 1973  . US  ECHOCARDIOGRAPHY  07/04/2008   trace MR,mild TR  . VAGINAL HYSTERECTOMY  1980    Current Medications: Outpatient Medications Prior to Visit  Medication Sig Dispense Refill  . acetaminophen (TYLENOL) 325 MG tablet Take 650 mg by mouth every 6 (six) hours as needed for mild pain, moderate pain, fever or headache.     . Evolocumab (REPATHA SURECLICK) 846 MG/ML SOAJ Inject 140 mg into the skin every 14 (fourteen) days. 2 pen 11  . Flaxseed, Linseed, (FLAX SEED OIL PO) Take 1 capsule by mouth daily.    . furosemide (LASIX) 20 MG tablet Take 1 tablet (20 mg total) by mouth daily. 90 tablet 3  . metoprolol tartrate (LOPRESSOR) 50 MG tablet Take 1 tablet (50 mg total) by mouth 2 (two) times daily. 180 tablet 3  . Multiple Vitamin (MULITIVITAMIN WITH MINERALS) TABS Take 1 tablet by mouth daily.    . quinapril (ACCUPRIL) 5 MG tablet Take 0.5 tablets (2.5 mg total) by mouth daily. 45 tablet 3  . Rivaroxaban (XARELTO) 15 MG TABS tablet Take 1 tablet (15 mg total) by mouth daily with supper. 90 tablet 1   No facility-administered medications prior to visit.      Allergies:   Codeine and Statins   Social History   Socioeconomic History  . Marital status: Divorced    Spouse name: Not on file  . Number of children: Not on file  . Years of education: Not on file  . Highest education level: Not on file  Occupational History  . Not on file  Social Needs  . Financial resource strain: Not on file  . Food insecurity:    Worry: Not on file    Inability: Not on file  . Transportation needs:    Medical: Not on file    Non-medical: Not on file  Tobacco Use  . Smoking status: Never Smoker  . Smokeless tobacco: Never Used  Substance and Sexual Activity  . Alcohol use: Yes    Comment: 06/10/2014 "might have a beer after mowing yard or a drink at holidays"  . Drug use: No  . Sexual activity: Never  Lifestyle  . Physical activity:    Days per week: Not on file    Minutes per session: Not on file    . Stress: Not on file  Relationships  . Social connections:    Talks on phone: Not on file    Gets together: Not on file    Attends religious service: Not on file    Active member of club or organization: Not on file    Attends meetings of clubs or organizations: Not on file    Relationship status: Not on file  Other Topics Concern  . Not on file  Social History Narrative  . Not on file     Family  History:  The patient's family history includes Congestive Heart Failure in her father; Heart attack in her father and mother.   ROS:   Please see the history of present illness.    ROS All other systems reviewed and are negative.   PHYSICAL EXAM:   VS:  BP 106/72 (BP Location: Left Arm, Patient Position: Sitting, Cuff Size: Normal)   Pulse 81   Ht 5\' 2"  (1.575 m)   Wt 169 lb (76.7 kg)   LMP 04/25/1978   BMI 30.91 kg/m      General: Alert, oriented x3, no distress, mildly obese Head: no evidence of trauma, PERRL, EOMI, no exophtalmos or lid lag, no myxedema, no xanthelasma; normal ears, nose and oropharynx Neck: normal jugular venous pulsations and no hepatojugular reflux; brisk carotid pulses without delay and no carotid bruits Chest: clear to auscultation, no signs of consolidation by percussion or palpation, normal fremitus, symmetrical and full respiratory excursions Cardiovascular: normal position and quality of the apical impulse, irregular rhythm, normal first and second heart sounds, no murmurs, rubs or gallops Abdomen: no tenderness or distention, no masses by palpation, no abnormal pulsatility or arterial bruits, normal bowel sounds, no hepatosplenomegaly Extremities: no clubbing, cyanosis; she has prominent bilateral varicose veins and 2+ edema in the left lower leg with scars of saphenectomy and skin cancer removal; 2+ radial, ulnar and brachial pulses bilaterally; 2+ right femoral, posterior tibial and dorsalis pedis pulses; 2+ left femoral, posterior tibial and dorsalis  pedis pulses; no subclavian or femoral bruits Neurological: grossly nonfocal Psych: Normal mood and affect    Wt Readings from Last 3 Encounters:  04/05/18 169 lb (76.7 kg)  03/30/17 177 lb 6.4 oz (80.5 kg)  01/16/17 182 lb (82.6 kg)      Studies/Labs Reviewed:   EKG:  EKG is ordered today.  It shows atrial fibrillation and is otherwise normal.  Rate is well controlled around 80 bpm.  QTc 441 ms.  Recent Labs: No results found for requested labs within last 8760 hours.   Lipid Panel    Component Value Date/Time   CHOL 158 04/02/2018 1138   TRIG 89 04/02/2018 1138   HDL 54 04/02/2018 1138   CHOLHDL 2.9 04/02/2018 1138   CHOLHDL 4.5 05/20/2016 0842   VLDL 22 05/20/2016 0842   LDLCALC 86 04/02/2018 1138     ASSESSMENT:    1. Permanent atrial fibrillation   2. Current use of long term anticoagulation   3. 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 Mitral valve regurgitation, rheumatic   5. CKD (chronic kidney disease) stage 3, GFR 30-59 ml/min (H[facility])   6. Hypercholesterolemia   7. Essential hypertension   8. Mild obesity      PLAN:  In order of problems listed above: 1. AFib: She had exertional chest discomfort and exertional dyspnea when her rate was poorly controlled, but these complaints have abated with higher dose of beta-blocker. CHADSVasc at least 5 (age 9, gender, CAD, hypertension). 2. Xarelto: (dose adjusted for low GFR).  Compliant with medication and no bleeding complications.. 3. CAD s/p CABG: She has had problems with angina when her ventricular rate is not well controlled but at this point in time is asymptomatic. 4. Moderate MR: Does not appear to be hemodynamically important.  I cannot hear it on physical exam today.. Could be the consequence of remote rheumatic heart disease.  No symptoms to suggest congestive heart failure. 5. CKD: estimated GFR 35-40  mL/min, on lower dose Xarelto.  Need to get  updated renal function parameters.  Corresponding to a GFR of 47, but we will not make any adjustment in her Xarelto dose at this time. 6. HLP: Excellent response to PCSK9 inhibitor.  Ideally would like her LDL cholesterol to be less than 70, but she has not tolerated any other lipid-lowering agents.  She was intolerant to all statins in the past. 7. HTN: Excellent control 8. Obesity: She is making slow but very good progress with weight loss, losing 13 pounds in the last year.  She is almost out of obesity range.    Medication Adjustments/Labs and Tests Ordered: Current medicines are reviewed at length with the patient today.  Concerns regarding medicines are outlined above.  Medication changes, Labs and Tests ordered today are listed in the Patient Instructions below. Patient Instructions  Medication Instructions:  Dr Sallyanne Kuster recommends that you continue on your current medications as directed. Please refer to the Current Medication list given to you today.  If you need a refill on your cardiac medications before your next appointment, please call your pharmacy.   Follow-Up: At [facility], you and your health needs are our priority.  As part of our continuing mission to provide you with exceptional heart care, we have created designated Provider Care Teams.  These Care Teams include your primary Cardiologist (physician) and Advanced Practice Providers (APPs -  Physician Assistants and Nurse Practitioners) who all work together to provide you with the care you need, when you need it. You will need a follow up appointment in 12 months. You may see Sanda Klein, MD or one of the following Advanced Practice Providers on your designated Care Team: [place], [place] . Fabian Sharp, PA-C . You will receive a reminder letter in the mail two months in advance. If you don't receive a letter, please call our office to schedule the follow-up appointment.    Signed, Sanda Klein, MD  04/06/2018  4:39 PM    [address], [place], [place]  [address] Phone: [phone]; Fax: [phone]

## 2018-04-16 DIAGNOSIS — E6609 Other obesity due to excess calories: Secondary | ICD-10-CM | POA: Diagnosis not present

## 2018-04-16 DIAGNOSIS — J069 Acute upper respiratory infection, unspecified: Secondary | ICD-10-CM | POA: Diagnosis not present

## 2018-04-16 DIAGNOSIS — Z1389 Encounter for screening for other disorder: Secondary | ICD-10-CM | POA: Diagnosis not present

## 2018-04-16 DIAGNOSIS — Z6832 Body mass index (BMI) 32.0-32.9, adult: Secondary | ICD-10-CM | POA: Diagnosis not present

## 2018-04-30 ENCOUNTER — Telehealth: Payer: Self-pay

## 2018-04-30 ENCOUNTER — Other Ambulatory Visit: Payer: Self-pay | Admitting: Pharmacist

## 2018-04-30 MED ORDER — FUROSEMIDE 20 MG PO TABS: 20.0000 mg | ORAL_TABLET | Freq: Every day | ORAL | 3 refills | Status: DC

## 2018-04-30 MED ORDER — METOPROLOL TARTRATE 50 MG PO TABS: 50.0000 mg | ORAL_TABLET | Freq: Two times a day (BID) | ORAL | 3 refills | Status: DC

## 2018-04-30 MED ORDER — QUINAPRIL HCL 5 MG PO TABS: 2.5000 mg | ORAL_TABLET | Freq: Every day | ORAL | 3 refills | Status: DC

## 2018-04-30 MED ORDER — EVOLOCUMAB 140 MG/ML ~~LOC~~ SOAJ: 140.0000 mg | SUBCUTANEOUS | 11 refills | Status: DC

## 2018-04-30 MED ORDER — RIVAROXABAN 15 MG PO TABS: 15.0000 mg | ORAL_TABLET | Freq: Every day | ORAL | 1 refills | Status: DC

## 2018-04-30 NOTE — Telephone Encounter (Signed)
Rx(s) sent to pharmacy electronically.  

## 2018-04-30 NOTE — Telephone Encounter (Signed)
Called pt to let her know that she isn't eligible for repatha free of charge from the manufacturer and that the pharmd sent new rx to pharmacy and to check the copay and call us back. Pt also stated that she never received some medicine from her last visit with Dr. Loletha Grayer will route to his [place] to make aware. Pt didn't specify which meds it was that is needed.

## 2018-05-16 NOTE — Telephone Encounter (Signed)
Called pt to let them know that there is no need to fill out new amgen paperwork and that pt will have to call [phone] to see if they can get assistance through that

## 2018-05-24 DIAGNOSIS — C44319 Basal cell carcinoma of skin of other parts of face: Secondary | ICD-10-CM | POA: Diagnosis not present

## 2018-06-22 NOTE — Telephone Encounter (Signed)
Pt called the office and stated that the [employer] foundation will give them $2500 towards the repatha for the year

## 2018-07-10 ENCOUNTER — Telehealth: Payer: Self-pay | Admitting: Cardiovascular Disease

## 2018-07-10 MED ORDER — EVOLOCUMAB 140 MG/ML ~~LOC~~ SOAJ: 140.0000 mg | SUBCUTANEOUS | 11 refills | Status: DC

## 2018-07-10 NOTE — Telephone Encounter (Signed)
Rx sent to prefer pharmacy as requested. Patient approved for $2500 from foundation to pay for [place]

## 2018-07-10 NOTE — Telephone Encounter (Signed)
New message   Pt c/o medication issue:  1. Name of Medication: Repatha  2. How are you currently taking this medication (dosage and times per day)? Injection twice monthly  3. Are you having a reaction (difficulty breathing--STAT)? No   4. What is your medication issue? Patient needs a prescription written to [facility] on [address] in [place], [place] for this medication.

## 2018-08-06 DIAGNOSIS — D509 Iron deficiency anemia, unspecified: Secondary | ICD-10-CM | POA: Diagnosis not present

## 2018-08-06 DIAGNOSIS — M0589 Other rheumatoid arthritis with rheumatoid factor of multiple sites: Secondary | ICD-10-CM | POA: Diagnosis not present

## 2018-08-06 DIAGNOSIS — Z681 Body mass index (BMI) 19 or less, adult: Secondary | ICD-10-CM | POA: Diagnosis not present

## 2018-08-06 DIAGNOSIS — I10 Essential (primary) hypertension: Secondary | ICD-10-CM | POA: Diagnosis not present

## 2018-08-06 DIAGNOSIS — D631 Anemia in chronic kidney disease: Secondary | ICD-10-CM | POA: Diagnosis not present

## 2018-08-06 DIAGNOSIS — N186 End stage renal disease: Secondary | ICD-10-CM | POA: Diagnosis not present

## 2018-08-06 DIAGNOSIS — R7309 Other abnormal glucose: Secondary | ICD-10-CM | POA: Diagnosis not present

## 2018-08-06 DIAGNOSIS — I2510 Atherosclerotic heart disease of native coronary artery without angina pectoris: Secondary | ICD-10-CM | POA: Diagnosis not present

## 2018-08-06 DIAGNOSIS — Z0001 Encounter for general adult medical examination with abnormal findings: Secondary | ICD-10-CM | POA: Diagnosis not present

## 2018-08-06 DIAGNOSIS — Z992 Dependence on renal dialysis: Secondary | ICD-10-CM | POA: Diagnosis not present

## 2018-08-07 ENCOUNTER — Other Ambulatory Visit: Payer: Self-pay | Admitting: Cardiovascular Disease

## 2019-01-22 ENCOUNTER — Other Ambulatory Visit: Payer: Self-pay | Admitting: Cardiovascular Disease

## 2019-01-23 DIAGNOSIS — I129 Hypertensive chronic kidney disease with stage 1 through stage 4 chronic kidney disease, or unspecified chronic kidney disease: Secondary | ICD-10-CM | POA: Diagnosis not present

## 2019-01-23 DIAGNOSIS — Z6835 Body mass index (BMI) 35.0-35.9, adult: Secondary | ICD-10-CM | POA: Diagnosis not present

## 2019-01-23 DIAGNOSIS — M109 Gout, unspecified: Secondary | ICD-10-CM | POA: Diagnosis not present

## 2019-01-23 DIAGNOSIS — N183 Chronic kidney disease, stage 3 (moderate): Secondary | ICD-10-CM | POA: Diagnosis not present

## 2019-01-23 NOTE — Telephone Encounter (Signed)
Refill request

## 2019-02-07 ENCOUNTER — Telehealth: Payer: Self-pay | Admitting: Cardiovascular Disease

## 2019-02-07 NOTE — Telephone Encounter (Signed)
° ° °  Pt c/o BP issue: STAT if pt c/o blurred vision, one-sided weakness or slurred speech  1. What are your last 5 BP readings? 143/79, 149/98, 156/78, 180/98  2. Are you having any other symptoms (ex. Dizziness, headache, blurred vision, passed out)? Pain under arm, back pain  3. What is your BP issue? Patient states BP too high

## 2019-02-07 NOTE — Telephone Encounter (Signed)
Let's increase the quinapril to a full 5 mg daily. It may take about a week to see the full mpact on BP.

## 2019-02-07 NOTE — Telephone Encounter (Signed)
Returned call to pt she states that her BP has been high for the last couple days. She states that last night hs had come bilat armpit pain it woke her up this morning at about 2am. And sometimes she will have intermittent either right or left shoulder blade pain with in the last few weeks also.  Her BP has been running  Today 0730 143/79 HR 73 1030 147/98 HR 73 2pm  156/78 HR 65 Yesterday  0730  180/98 HR 72 1030 156/84 HR 74 2pm 140/83 HR69  Pt states that she realizes that this is not too high but this is "high for her". She will continue to take BP TID and will call back if >170. Please advise.

## 2019-02-08 NOTE — Telephone Encounter (Signed)
Pt informed of providers result & recommendations. Pt verbalized understanding. No further questions . SHE WILL CONTINUE TO TAKE BP TID AND WILL CB IF THIS DOES NOT HELP BP.

## 2019-02-25 ENCOUNTER — Other Ambulatory Visit: Payer: Self-pay

## 2019-02-25 DIAGNOSIS — E785 Hyperlipidemia, unspecified: Secondary | ICD-10-CM

## 2019-03-18 ENCOUNTER — Telehealth: Payer: Self-pay

## 2019-03-18 NOTE — Telephone Encounter (Signed)
Called and spoke w/pt regarding receiving the letter w/snf paperwork in it. I also encouraged the pt to reapply for the [employer] foundation as well to see which will offer up the best funding for the repatha medicatioon

## 2019-03-25 DIAGNOSIS — E785 Hyperlipidemia, unspecified: Secondary | ICD-10-CM | POA: Diagnosis not present

## 2019-03-25 LAB — LIPID PANEL
Chol/HDL Ratio: 2.8 ratio (ref 0.0–4.4)
Cholesterol, Total: 177 mg/dL (ref 100–199)
HDL: 63 mg/dL (ref >39)
LDL Chol Calc (NIH): 97 mg/dL (ref 0–99)
Triglycerides: 92 mg/dL (ref 0–149)
VLDL Cholesterol Cal: 17 mg/dL (ref 5–40)

## 2019-04-05 ENCOUNTER — Encounter: Payer: Self-pay | Admitting: Cardiovascular Disease

## 2019-04-05 ENCOUNTER — Ambulatory Visit: Payer: PPO | Admitting: Cardiovascular Disease

## 2019-04-05 ENCOUNTER — Other Ambulatory Visit: Payer: Self-pay

## 2019-04-05 VITALS — BP 151/79 | HR 95 | Temp 97.1°F | Ht 62.0 in | Wt 173.0 lb

## 2019-04-05 DIAGNOSIS — N1831 Chronic kidney disease, stage 3a: Secondary | ICD-10-CM | POA: Diagnosis not present

## 2019-04-05 DIAGNOSIS — I4821 Permanent atrial fibrillation: Secondary | ICD-10-CM

## 2019-04-05 DIAGNOSIS — E669 Obesity, unspecified: Secondary | ICD-10-CM | POA: Diagnosis not present

## 2019-04-05 DIAGNOSIS — E7800 Pure hypercholesterolemia, unspecified: Secondary | ICD-10-CM

## 2019-04-05 DIAGNOSIS — Z7901 Long term (current) use of anticoagulants: Secondary | ICD-10-CM | POA: Diagnosis not present

## 2019-04-05 DIAGNOSIS — I10 Essential (primary) hypertension: Secondary | ICD-10-CM | POA: Diagnosis not present

## 2019-04-05 DIAGNOSIS — I25708 Atherosclerosis of coronary artery bypass graft(s), unspecified, with other forms of angina pectoris: Secondary | ICD-10-CM

## 2019-04-05 MED ORDER — QUINAPRIL HCL 10 MG PO TABS: 10.0000 mg | ORAL_TABLET | Freq: Every day | ORAL | 11 refills | Status: DC

## 2019-04-05 NOTE — Patient Instructions (Signed)
Medication Instructions: INCREASE Quinapril to 10 mg  1 tab daily *If you need a refill on your cardiac medications before your next appointment, please call your pharmacy*  Lab Work: None at this time  If you have labs (blood work) drawn today and your tests are completely normal, you will receive your results only by: Marland Kitchen MyChart Message (if you have MyChart) OR . A paper copy in the mail If you have any lab test that is abnormal or we need to change your treatment, we will call you to review the results.   Follow-Up: At [facility], you and your health needs are our priority.  As part of our continuing mission to provide you with exceptional heart care, we have created designated Provider Care Teams.  These Care Teams include your primary Cardiologist (physician) and Advanced Practice Providers (APPs -  Physician Assistants and Nurse Practitioners) who all work together to provide you with the care you need, when you need it.  Your next appointment:   12 month(s)  The format for your next appointment:   In Person  Provider:   Sanda Klein, MD

## 2019-04-05 NOTE — Progress Notes (Signed)
Sandra Bradford in the left leg where she had her saphenectomy's.  She had a skin cancer removed   Cardiology Office Note    Date:  04/05/2019   ID:  [name], DOB Mar 20, 1941, MRN [id number]  PCP:  Sandra Sites, MD  Cardiologist:   Sandra Klein, MD   No chief complaint on file.   History of Present Illness:  Sandra Bradford is a 78 y.o. female with CAD s/p CABG 2002, permanent atrial fibrillation here for follow-up.  Proxy is daughter Sandra Bradford and her husband Sandra Bradford are also my patients.  I was sending the restrictions related to the coronavirus pandemic, Sandra Bradford has had a good year, without any serious health problems.  Her blood pressure has been a little harder to control and we increased her dose of quinapril, but her systolic blood pressure still often in the 150-160 range especially in the mornings.  She continues to live independently and denies problems with exertional angina or dyspnea, orthopnea, PND, palpitations, dizziness or syncope, headache or focal neurological complaints.  She has edema in the lower extremities, particularly on the left side where she had her saphenectomy.  This is very mild when she wakes up in the morning but gets worse by the end of the day.  She has not had any falls, injuries or bleeding problems.  She has reached the donut hole on her Medicare prescriptions.  She is tolerating lipid-lowering therapy with Repatha well and has excellent lab results.  Although her LDL is not below the target of 70, it has decreased more than 50% from baseline.  Before that we tried virtually all available statins without success due to myalgia.  She has chronic kidney disease stage 3 followed by Dr. Moshe Bradford.  Her most recent creatinine was actually improved at 1.2 from December 27, 2018.  Cath 2016: 1. Severe 3 vessel obstructive CAD 2. Patent but atretic LIMA to the LAD. The distal LAD is small and severely diseased. 3. Occluded SVG to the diagonal 4. Patent SVG to  OM 5. Patent SVG to the PDA   Past Medical History:  Diagnosis Date  . A-fib ([place])   . Abnormal nuclear cardiac imaging test 05/01/2014  . Arthritis    "scattered" (06/10/2014)  . Carotid arterial disease ([place])   . Chronic bronchitis ([place])    "several times; haven't had it since I quit working in 2008" (06/10/2014)  . Chronic kidney disease, stage 3   . Coronary artery disease   . Diverticulitis   . DVT (deep venous thrombosis) ([place]) 2001   "related to sitting around when I broke my foot"  . Heart murmur   . History of blood transfusion 01/2001   "related to OHS"   . Hyperlipemia   . PAD (peripheral artery disease) ([place])   . Pneumonia ~ 2010 X 2  . PONV (postoperative nausea and vomiting)   . 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 Systemic hypertension     Past Surgical History:  Procedure Laterality Date  . CARDIAC CATHETERIZATION  2002; 2005  . CARDIOVERSION N/A 05/08/2014   Procedure: CARDIOVERSION;  Surgeon: Sandra Klein, MD;  Location: [facility];  Service: Cardiovascular;  Laterality: N/A;  . CHOLECYSTECTOMY N/A 01/17/2013   Procedure: LAPAROSCOPIC CHOLECYSTECTOMY;  Surgeon: Sandra Heinz, MD;  Location: [facility];  Service: General;  Laterality: N/A;  . COLONOSCOPY  July 2007   Sandra Bradford: ascending and sigmoid colon diverticula  . COLONOSCOPY  Dec 2011  Sandra Bradford: ischemic colitis. CTA performed with patent celiac, SMA, and IMA  . COLONOSCOPY N/A 08/06/2013   Dr. Gala Bradford: colonic diverticulosis, internal hemorrhoids   . COLONOSCOPY N/A 08/22/2016   Dr. Gala Bradford: non-bleeding internal hemorrhoids, diverticulosis in sigmoid colon.   . CORONARY ARTERY BYPASS GRAFT  01/2001   LIMA TO LAD,SVG TO DIAGONAL,SVG TO MARGINAL,SVG SEQUENTALLY TO THE PD & PL VESSEL  . KNEE ARTHROSCOPY Right ~ 1985  . LEFT HEART CATHETERIZATION WITH CORONARY/GRAFT ANGIOGRAM N/A 06/11/2014   Procedure: LEFT HEART CATHETERIZATION WITH Beatrix Fetters;  Surgeon: Sandra M Martinique,  MD;  Location: [facility] CATH LAB;  Service: Cardiovascular;  Laterality: N/A;  . MOHS SURGERY  ~ 04/2013   upper lip  . NM MYOCAR PERF WALL MOTION  08/18/2008   No significant ischemia  . TONSILLECTOMY  1953  . TUBAL LIGATION  ~ 1973  . US ECHOCARDIOGRAPHY  07/04/2008   trace MR,mild TR  . VAGINAL HYSTERECTOMY  1980    Current Medications: Outpatient Medications Prior to Visit  Medication Sig Dispense Refill  . acetaminophen (TYLENOL) 325 MG tablet Take 650 mg by mouth every 6 (six) hours as needed for mild pain, moderate pain, fever or headache.     . allopurinol (ZYLOPRIM) 100 MG tablet Take 100 mg by mouth daily.    . colchicine 0.6 MG tablet Take 0.6 mg by mouth daily. Patient takes as needed.    . Evolocumab (REPATHA SURECLICK) 170 MG/ML SOAJ Inject 140 mg into the skin every 14 (fourteen) days. 2 pen 11  . Flaxseed, Linseed, (FLAX SEED OIL PO) Take 1 capsule by mouth daily.    . furosemide (LASIX) 20 MG tablet Take 1 tablet (20 mg total) by mouth daily. 90 tablet 3  . metoprolol tartrate (LOPRESSOR) 50 MG tablet Take 1 tablet (50 mg total) by mouth 2 (two) times daily. 180 tablet 3  . Multiple Vitamin (MULITIVITAMIN WITH MINERALS) TABS Take 1 tablet by mouth daily.    . niacin 500 MG tablet Take by mouth.    . predniSONE (DELTASONE) 10 MG tablet Patient takes as needed.    . Rivaroxaban (XARELTO) 15 MG TABS tablet Take 1 tablet (15 mg total) by mouth daily with supper. LABS NEEDED FOR FURTHER REFILLS 90 tablet 0  . quinapril (ACCUPRIL) 5 MG tablet Take 0.5 tablets (2.5 mg total) by mouth daily. 45 tablet 3   No facility-administered medications prior to visit.     Allergies:   Codeine and Statins   Social History   Socioeconomic History  . Marital status: Divorced    Spouse name: Not on file  . Number of children: Not on file  . Years of education: Not on file  . Highest education level: Not on file  Occupational History  . Not on file  Tobacco Use  . Smoking status: Never  Smoker  . Smokeless tobacco: Never Used  Substance and Sexual Activity  . Alcohol use: Yes    Comment: 06/10/2014 "might have a beer after mowing yard or a drink at holidays"  . Drug use: No  . Sexual activity: Never  Other Topics Concern  . Not on file  Social History Narrative  . Not on file   Social Determinants of Health   Financial Resource Strain:   . Difficulty of Paying Living Expenses: Not on file  Food Insecurity:   . Worried About Charity fundraiser in the Last Year: Not on file  . Ran Out of Food in the Last Year:  Not on file  Transportation Needs:   . Lack of Transportation (Medical): Not on file  . Lack of Transportation (Non-Medical): Not on file  Physical Activity:   . Days of Exercise per Week: Not on file  . Minutes of Exercise per Session: Not on file  Stress:   . Feeling of Stress : Not on file  Social Connections:   . Frequency of Communication with Friends and Family: Not on file  . Frequency of Social Gatherings with Friends and Family: Not on file  . Attends Religious Services: Not on file  . Active Member of Clubs or Organizations: Not on file  . Attends Archivist Meetings: Not on file  . Marital Status: Not on file     Family History:  The patient's family history includes Congestive Heart Failure in her father; Heart attack in her father and mother.   ROS:   Please see the history of present illness.    ROS All other systems reviewed and are negative.   PHYSICAL EXAM:   VS:  BP (!) 151/79   Pulse 95   Temp (!) 97.1 F (36.2 C)   Ht 5\' 2"  (1.575 m)   Wt 173 lb (78.5 kg)   LMP 04/25/1978   SpO2 97%   BMI 31.64 kg/m      General: Alert, oriented x3, no distress, obese Head: no evidence of trauma, PERRL, EOMI, no exophtalmos or lid lag, no myxedema, no xanthelasma; normal ears, nose and oropharynx Neck: normal jugular venous pulsations and no hepatojugular reflux; brisk carotid pulses without delay and no carotid  bruits Chest: clear to auscultation, no signs of consolidation by percussion or palpation, normal fremitus, symmetrical and full respiratory excursions Cardiovascular: normal position and quality of the apical impulse, irregular rhythm, normal first and second heart sounds, no murmurs, rubs or gallops Abdomen: no tenderness or distention, no masses by palpation, no abnormal pulsatility or arterial bruits, normal bowel sounds, no hepatosplenomegaly Extremities: no clubbing, cyanosis; she has slightly asymmetrical ankle edema, 1+ on the right, 2+ on the left.; 2+ radial, ulnar and brachial pulses bilaterally; 2+ right femoral, posterior tibial and dorsalis pedis pulses; 2+ left femoral, posterior tibial and dorsalis pedis pulses; no subclavian or femoral bruits Neurological: grossly nonfocal Psych: Normal mood and affect   Wt Readings from Last 3 Encounters:  04/05/19 173 lb (78.5 kg)  04/05/18 169 lb (76.7 kg)  03/30/17 177 lb 6.4 oz (80.5 kg)      Studies/Labs Reviewed:   EKG:  EKG is ordered today.  Shows atrial fibrillation with ventricular rate control, otherwise normal, no repolarization abnormalities, QTC 412 ms  Recent Labs: No results found for requested labs within last 8760 hours.   Lipid Panel    Component Value Date/Time   CHOL 177 03/25/2019 1015   TRIG 92 03/25/2019 1015   HDL 63 03/25/2019 1015   CHOLHDL 2.8 03/25/2019 1015   CHOLHDL 4.5 05/20/2016 0842   VLDL 22 05/20/2016 0842   LDLCALC 97 03/25/2019 1015     ASSESSMENT:    1. Permanent atrial fibrillation ([place])   2. Long term current use of anticoagulant   3. Coronary artery disease of bypass graft of native heart with stable angina pectoris (HCC)   4. Stage 3a chronic kidney disease   5. Hypercholesterolemia   6. Essential hypertension   7. Mild obesity      PLAN:  In order of problems listed above: 1. AFib: Somatic, excellent rate control CHADSVasc at least  5 (age 19, gender, CAD,  hypertension). 2. Xarelto: (dose adjusted for low GFR).  If her creatinine remains at this level may have to increase her dose to 20 mg daily.  She has never had embolic events. 3. CAD s/p CABG: Now angina free after we achieved good rate control. 4. Moderate MR: Not audible on physical exam. 5. CKD: Stage IIIa, may need re-adjustment in her Xarelto dose. 6. HLP: Most recent LDL cholesterol 97.  Excellent response to PCSK9 inhibitor.  Ideally would like her LDL cholesterol to be less than 70, but she has not tolerated any other lipid-lowering agents.  She was intolerant to all statins in the past. 7. HTN: Inadequate control.  Increase quinapril to 10 mg once daily. 8. Obesity: She is making slow but very good progress with weight loss, losing 13 pounds in the last year.  She is almost out of obesity range.  She has "prediabetes" with a hemoglobin A1c of 6.2%.  She readily admits her weakness for sweets.  To discuss ways to avoid high carbohydrate loads.  Discussed the concept of glycemic index.    Medication Adjustments/Labs and Tests Ordered: Current medicines are reviewed at length with the patient today.  Concerns regarding medicines are outlined above.  Medication changes, Labs and Tests ordered today are listed in the Patient Instructions below. Patient Instructions  Medication Instructions: INCREASE Quinapril to 10 mg  1 tab daily *If you need a refill on your cardiac medications before your next appointment, please call your pharmacy*  Lab Work: None at this time  If you have labs (blood work) drawn today and your tests are completely normal, you will receive your results only by: Marland Kitchen MyChart Message (if you have MyChart) OR . A paper copy in the mail If you have any lab test that is abnormal or we need to change your treatment, we will call you to review the results.   Follow-Up: At [facility], you and your health needs are our priority.  As part of our continuing mission to  provide you with exceptional heart care, we have created designated Provider Care Teams.  These Care Teams include your primary Cardiologist (physician) and Advanced Practice Providers (APPs -  Physician Assistants and Nurse Practitioners) who all work together to provide you with the care you need, when you need it.  Your next appointment:   12 month(s)  The format for your next appointment:   In Person  Provider:   Sanda Klein, MD       Signed, Sandra Klein, MD  04/05/2019 9:59 AM    [facility] [place], [place], [place]  [address] Phone: [phone]; Fax: [phone]

## 2019-04-15 DIAGNOSIS — M25512 Pain in left shoulder: Secondary | ICD-10-CM | POA: Diagnosis not present

## 2019-04-15 DIAGNOSIS — E6609 Other obesity due to excess calories: Secondary | ICD-10-CM | POA: Diagnosis not present

## 2019-04-15 DIAGNOSIS — Z6832 Body mass index (BMI) 32.0-32.9, adult: Secondary | ICD-10-CM | POA: Diagnosis not present

## 2019-05-06 ENCOUNTER — Other Ambulatory Visit: Payer: Self-pay

## 2019-05-13 ENCOUNTER — Other Ambulatory Visit: Payer: Self-pay

## 2019-05-13 MED ORDER — FUROSEMIDE 20 MG PO TABS: 20.0000 mg | ORAL_TABLET | Freq: Every day | ORAL | 3 refills | Status: DC

## 2019-05-14 ENCOUNTER — Other Ambulatory Visit: Payer: Self-pay

## 2019-05-14 MED ORDER — METOPROLOL TARTRATE 50 MG PO TABS: 50.0000 mg | ORAL_TABLET | Freq: Two times a day (BID) | ORAL | 3 refills | Status: DC

## 2019-05-15 ENCOUNTER — Other Ambulatory Visit: Payer: Self-pay | Admitting: Cardiovascular Disease

## 2019-05-15 NOTE — Telephone Encounter (Signed)
New Message      *STAT* If patient is at the pharmacy, call can be transferred to refill team.   1. Which medications need to be refilled? (please list name of each medication and dose if known)  Rivaroxaban (XARELTO) 15 MG TABS tablet quinapril (ACCUPRIL) 10 MG tablet metoprolol tartrate (LOPRESSOR) 50 MG tablet furosemide (LASIX) 20 MG tablet Evolocumab (REPATHA SURECLICK) 366 MG/ML SOAJ  2. Which pharmacy/location (including street and city if local pharmacy) is medication to be sent to? [facility] in [place]   3. Do they need a 30 day or 90 day supply? [place] from [facility] is calling and says the pts Updated Pharmacy is [facility] in [place] would like a confirmation call back that the medications were sent

## 2019-05-17 ENCOUNTER — Other Ambulatory Visit: Payer: Self-pay

## 2019-05-17 MED ORDER — REPATHA SURECLICK 140 MG/ML ~~LOC~~ SOAJ: 140.0000 mg | SUBCUTANEOUS | 11 refills | Status: DC

## 2019-05-17 MED ORDER — FUROSEMIDE 20 MG PO TABS: 20.0000 mg | ORAL_TABLET | Freq: Every day | ORAL | 3 refills | Status: DC

## 2019-05-17 MED ORDER — QUINAPRIL HCL 10 MG PO TABS: 10.0000 mg | ORAL_TABLET | Freq: Every day | ORAL | 11 refills | Status: DC

## 2019-05-17 MED ORDER — RIVAROXABAN 15 MG PO TABS: 15.0000 mg | ORAL_TABLET | Freq: Every day | ORAL | 0 refills | Status: DC

## 2019-05-17 MED ORDER — METOPROLOL TARTRATE 50 MG PO TABS: 50.0000 mg | ORAL_TABLET | Freq: Two times a day (BID) | ORAL | 3 refills | Status: DC

## 2019-05-17 NOTE — Telephone Encounter (Signed)
Refills had been sent over to [facility] .

## 2019-05-17 NOTE — Telephone Encounter (Signed)
Meds have already been refilled.

## 2019-05-20 ENCOUNTER — Other Ambulatory Visit: Payer: Self-pay | Admitting: Cardiovascular Disease

## 2019-05-20 MED ORDER — QUINAPRIL HCL 10 MG PO TABS: 10.0000 mg | ORAL_TABLET | Freq: Every day | ORAL | 11 refills | Status: DC

## 2019-05-20 MED ORDER — RIVAROXABAN 15 MG PO TABS: 15.0000 mg | ORAL_TABLET | Freq: Every day | ORAL | 0 refills | Status: DC

## 2019-05-20 MED ORDER — REPATHA SURECLICK 140 MG/ML ~~LOC~~ SOAJ: 140.0000 mg | SUBCUTANEOUS | 11 refills | Status: DC

## 2019-05-20 MED ORDER — METOPROLOL TARTRATE 50 MG PO TABS: 50.0000 mg | ORAL_TABLET | Freq: Two times a day (BID) | ORAL | 3 refills | Status: DC

## 2019-05-20 MED ORDER — FUROSEMIDE 20 MG PO TABS: 20.0000 mg | ORAL_TABLET | Freq: Every day | ORAL | 3 refills | Status: DC

## 2019-05-20 NOTE — Telephone Encounter (Signed)
 *  STAT* If patient is at the pharmacy, call can be transferred to refill team.   1. Which medications need to be refilled? (please list name of each medication and dose if known)   furosemide (LASIX) 20 MG tablet quinapril (ACCUPRIL) 10 MG tablet Rivaroxaban (XARELTO) 15 MG TABS tablet metoprolol tartrate (LOPRESSOR) 50 MG tablet Evolocumab (REPATHA SURECLICK) 943 MG/ML SOAJ  2. Which pharmacy/location (including street and city if local pharmacy) is medication to be sent to?   Patient is changing pharmacy and needs new prescriptions [facility] to [facility] fax # [phone]  3. Do they need a 30 day or 90 day supply? 90 days

## 2019-05-26 DIAGNOSIS — I129 Hypertensive chronic kidney disease with stage 1 through stage 4 chronic kidney disease, or unspecified chronic kidney disease: Secondary | ICD-10-CM | POA: Diagnosis not present

## 2019-05-26 DIAGNOSIS — I2510 Atherosclerotic heart disease of native coronary artery without angina pectoris: Secondary | ICD-10-CM | POA: Diagnosis not present

## 2019-05-26 DIAGNOSIS — I4821 Permanent atrial fibrillation: Secondary | ICD-10-CM | POA: Diagnosis not present

## 2019-05-26 DIAGNOSIS — N1831 Chronic kidney disease, stage 3a: Secondary | ICD-10-CM | POA: Diagnosis not present

## 2019-07-09 ENCOUNTER — Ambulatory Visit (HOSPITAL_COMMUNITY)
Admission: RE | Admit: 2019-07-09 | Discharge: 2019-07-09 | Disposition: A | Discharge status: Home / Self Care | Payer: PPO | Source: Ambulatory Visit | Attending: Physician Assistant | Admitting: Physician Assistant

## 2019-07-09 ENCOUNTER — Other Ambulatory Visit: Payer: Self-pay

## 2019-07-09 ENCOUNTER — Other Ambulatory Visit (HOSPITAL_COMMUNITY): Payer: Self-pay | Admitting: Physician Assistant

## 2019-07-09 DIAGNOSIS — M79605 Pain in left leg: Secondary | ICD-10-CM | POA: Insufficient documentation

## 2019-07-09 DIAGNOSIS — E6609 Other obesity due to excess calories: Secondary | ICD-10-CM | POA: Diagnosis not present

## 2019-07-09 DIAGNOSIS — Z6831 Body mass index (BMI) 31.0-31.9, adult: Secondary | ICD-10-CM | POA: Diagnosis not present

## 2019-07-16 DIAGNOSIS — R7309 Other abnormal glucose: Secondary | ICD-10-CM | POA: Diagnosis not present

## 2019-07-16 DIAGNOSIS — Z6831 Body mass index (BMI) 31.0-31.9, adult: Secondary | ICD-10-CM | POA: Diagnosis not present

## 2019-07-16 DIAGNOSIS — M2550 Pain in unspecified joint: Secondary | ICD-10-CM | POA: Diagnosis not present

## 2019-07-16 DIAGNOSIS — M791 Myalgia, unspecified site: Secondary | ICD-10-CM | POA: Diagnosis not present

## 2019-07-16 DIAGNOSIS — M1991 Primary osteoarthritis, unspecified site: Secondary | ICD-10-CM | POA: Diagnosis not present

## 2019-07-23 DIAGNOSIS — M25552 Pain in left hip: Secondary | ICD-10-CM | POA: Diagnosis not present

## 2019-07-24 DIAGNOSIS — I129 Hypertensive chronic kidney disease with stage 1 through stage 4 chronic kidney disease, or unspecified chronic kidney disease: Secondary | ICD-10-CM | POA: Diagnosis not present

## 2019-07-24 DIAGNOSIS — N1831 Chronic kidney disease, stage 3a: Secondary | ICD-10-CM | POA: Diagnosis not present

## 2019-07-24 DIAGNOSIS — I4821 Permanent atrial fibrillation: Secondary | ICD-10-CM | POA: Diagnosis not present

## 2019-08-02 DIAGNOSIS — Z6829 Body mass index (BMI) 29.0-29.9, adult: Secondary | ICD-10-CM | POA: Diagnosis not present

## 2019-08-02 DIAGNOSIS — I4891 Unspecified atrial fibrillation: Secondary | ICD-10-CM | POA: Diagnosis not present

## 2019-08-02 DIAGNOSIS — I10 Essential (primary) hypertension: Secondary | ICD-10-CM | POA: Diagnosis not present

## 2019-08-02 DIAGNOSIS — I2510 Atherosclerotic heart disease of native coronary artery without angina pectoris: Secondary | ICD-10-CM | POA: Diagnosis not present

## 2019-08-02 DIAGNOSIS — Z1389 Encounter for screening for other disorder: Secondary | ICD-10-CM | POA: Diagnosis not present

## 2019-08-02 DIAGNOSIS — Z0001 Encounter for general adult medical examination with abnormal findings: Secondary | ICD-10-CM | POA: Diagnosis not present

## 2019-08-05 DIAGNOSIS — M545 Low back pain: Secondary | ICD-10-CM | POA: Diagnosis not present

## 2019-08-08 ENCOUNTER — Telehealth: Payer: Self-pay | Admitting: Cardiovascular Disease

## 2019-08-08 MED ORDER — RIVAROXABAN 15 MG PO TABS: 15.0000 mg | ORAL_TABLET | Freq: Every day | ORAL | 1 refills | Status: DC

## 2019-08-08 NOTE — Telephone Encounter (Signed)
*  STAT* If patient is at the pharmacy, call can be transferred to refill team.   1. Which medications need to be refilled? (please list name of each medication and dose if known) Rivaroxaban (XARELTO) 15 MG TABS tablet  2. Which pharmacy/location (including street and city if local pharmacy) is medication to be sent to? [facility] - [place], [place] - [place] [address]  3. Do they need a 30 day or 90 day supply? 90 day  Patient is out meds.

## 2019-08-12 DIAGNOSIS — M48061 Spinal stenosis, lumbar region without neurogenic claudication: Secondary | ICD-10-CM | POA: Diagnosis not present

## 2019-08-12 DIAGNOSIS — M25552 Pain in left hip: Secondary | ICD-10-CM | POA: Diagnosis not present

## 2019-08-23 DIAGNOSIS — I2510 Atherosclerotic heart disease of native coronary artery without angina pectoris: Secondary | ICD-10-CM | POA: Diagnosis not present

## 2019-08-23 DIAGNOSIS — I129 Hypertensive chronic kidney disease with stage 1 through stage 4 chronic kidney disease, or unspecified chronic kidney disease: Secondary | ICD-10-CM | POA: Diagnosis not present

## 2019-08-23 DIAGNOSIS — I4821 Permanent atrial fibrillation: Secondary | ICD-10-CM | POA: Diagnosis not present

## 2019-08-23 DIAGNOSIS — N1831 Chronic kidney disease, stage 3a: Secondary | ICD-10-CM | POA: Diagnosis not present

## 2019-08-28 DIAGNOSIS — Z6831 Body mass index (BMI) 31.0-31.9, adult: Secondary | ICD-10-CM | POA: Diagnosis not present

## 2019-08-28 DIAGNOSIS — N958 Other specified menopausal and perimenopausal disorders: Secondary | ICD-10-CM | POA: Diagnosis not present

## 2019-08-28 DIAGNOSIS — Z1231 Encounter for screening mammogram for malignant neoplasm of breast: Secondary | ICD-10-CM | POA: Diagnosis not present

## 2019-08-28 DIAGNOSIS — Z124 Encounter for screening for malignant neoplasm of cervix: Secondary | ICD-10-CM | POA: Diagnosis not present

## 2019-08-28 DIAGNOSIS — M816 Localized osteoporosis [Lequesne]: Secondary | ICD-10-CM | POA: Diagnosis not present

## 2019-08-29 ENCOUNTER — Other Ambulatory Visit: Payer: Self-pay | Admitting: Obstetrics and Gynecology

## 2019-08-29 DIAGNOSIS — R928 Other abnormal and inconclusive findings on diagnostic imaging of breast: Secondary | ICD-10-CM

## 2019-09-02 ENCOUNTER — Ambulatory Visit
Admission: RE | Admit: 2019-09-02 | Discharge: 2019-09-02 | Disposition: A | Discharge status: Home / Self Care | Payer: PPO | Source: Ambulatory Visit | Attending: Obstetrics and Gynecology | Admitting: Obstetrics and Gynecology

## 2019-09-02 ENCOUNTER — Other Ambulatory Visit: Payer: Self-pay | Admitting: Obstetrics and Gynecology

## 2019-09-02 ENCOUNTER — Other Ambulatory Visit: Payer: Self-pay

## 2019-09-02 DIAGNOSIS — R928 Other abnormal and inconclusive findings on diagnostic imaging of breast: Secondary | ICD-10-CM | POA: Diagnosis not present

## 2019-09-17 ENCOUNTER — Ambulatory Visit
Admission: RE | Admit: 2019-09-17 | Discharge: 2019-09-17 | Disposition: A | Discharge status: Home / Self Care | Payer: PPO | Source: Ambulatory Visit | Attending: Obstetrics and Gynecology | Admitting: Obstetrics and Gynecology

## 2019-09-17 ENCOUNTER — Other Ambulatory Visit: Payer: Self-pay

## 2019-09-17 DIAGNOSIS — R928 Other abnormal and inconclusive findings on diagnostic imaging of breast: Secondary | ICD-10-CM

## 2019-09-17 DIAGNOSIS — R920 Mammographic microcalcification found on diagnostic imaging of breast: Secondary | ICD-10-CM | POA: Diagnosis not present

## 2019-09-17 DIAGNOSIS — C50919 Malignant neoplasm of unspecified site of unspecified female breast: Secondary | ICD-10-CM

## 2019-09-17 DIAGNOSIS — C50411 Malignant neoplasm of upper-outer quadrant of right female breast: Secondary | ICD-10-CM | POA: Diagnosis not present

## 2019-09-17 DIAGNOSIS — Z170 Estrogen receptor positive status [ER+]: Secondary | ICD-10-CM | POA: Diagnosis not present

## 2019-09-17 HISTORY — DX: Malignant neoplasm of unspecified site of unspecified female breast: C50.919

## 2019-09-17 HISTORY — PX: BREAST LUMPECTOMY: SHX2

## 2019-09-18 ENCOUNTER — Other Ambulatory Visit: Payer: PPO

## 2019-09-18 ENCOUNTER — Other Ambulatory Visit: Payer: Self-pay | Admitting: Obstetrics and Gynecology

## 2019-09-18 DIAGNOSIS — C50011 Malignant neoplasm of nipple and areola, right female breast: Secondary | ICD-10-CM

## 2019-09-19 ENCOUNTER — Encounter: Payer: Self-pay | Admitting: *Deleted

## 2019-09-19 NOTE — Progress Notes (Signed)
Spoke with patient to give her appointment for [facility] 6/2.  Appointment confirmed for 09/25/19 at 12:15pm.  Assessed navigation needs and answered questions. Gave contact information. Verified and emailed intake form, map,and appointment info.

## 2019-09-20 ENCOUNTER — Ambulatory Visit
Admission: RE | Admit: 2019-09-20 | Discharge: 2019-09-20 | Disposition: A | Discharge status: Home / Self Care | Payer: PPO | Source: Ambulatory Visit | Attending: Obstetrics and Gynecology | Admitting: Obstetrics and Gynecology

## 2019-09-20 ENCOUNTER — Other Ambulatory Visit: Payer: Self-pay

## 2019-09-20 DIAGNOSIS — C50011 Malignant neoplasm of nipple and areola, right female breast: Secondary | ICD-10-CM

## 2019-09-20 DIAGNOSIS — C50911 Malignant neoplasm of unspecified site of right female breast: Secondary | ICD-10-CM | POA: Diagnosis not present

## 2019-09-23 DIAGNOSIS — N1831 Chronic kidney disease, stage 3a: Secondary | ICD-10-CM | POA: Diagnosis not present

## 2019-09-23 DIAGNOSIS — I4821 Permanent atrial fibrillation: Secondary | ICD-10-CM | POA: Diagnosis not present

## 2019-09-23 DIAGNOSIS — I2510 Atherosclerotic heart disease of native coronary artery without angina pectoris: Secondary | ICD-10-CM | POA: Diagnosis not present

## 2019-09-23 DIAGNOSIS — I129 Hypertensive chronic kidney disease with stage 1 through stage 4 chronic kidney disease, or unspecified chronic kidney disease: Secondary | ICD-10-CM | POA: Diagnosis not present

## 2019-09-24 ENCOUNTER — Other Ambulatory Visit: Payer: Self-pay | Admitting: *Deleted

## 2019-09-24 ENCOUNTER — Telehealth: Payer: Self-pay | Admitting: Hematology

## 2019-09-24 DIAGNOSIS — Z170 Estrogen receptor positive status [ER+]: Secondary | ICD-10-CM

## 2019-09-24 NOTE — Telephone Encounter (Signed)
error 

## 2019-09-25 ENCOUNTER — Ambulatory Visit
Admission: RE | Admit: 2019-09-25 | Discharge: 2019-09-25 | Disposition: A | Discharge status: Home / Self Care | Payer: PPO | Source: Ambulatory Visit | Attending: Radiation Oncology | Admitting: Radiation Oncology

## 2019-09-25 ENCOUNTER — Ambulatory Visit: Payer: PPO | Admitting: Physical Therapy

## 2019-09-25 ENCOUNTER — Inpatient Hospital Stay: Payer: PPO | Attending: Hematology | Admitting: Hematology

## 2019-09-25 ENCOUNTER — Other Ambulatory Visit: Payer: Self-pay

## 2019-09-25 ENCOUNTER — Encounter: Payer: Self-pay | Admitting: Hematology

## 2019-09-25 ENCOUNTER — Inpatient Hospital Stay: Payer: PPO

## 2019-09-25 ENCOUNTER — Encounter: Payer: Self-pay | Admitting: *Deleted

## 2019-09-25 ENCOUNTER — Encounter: Payer: Self-pay | Admitting: General Practice

## 2019-09-25 ENCOUNTER — Ambulatory Visit: Payer: Self-pay | Admitting: Surgery

## 2019-09-25 VITALS — BP 124/71 | HR 84 | Temp 98.7°F | Resp 18 | Ht 62.0 in | Wt 170.0 lb

## 2019-09-25 DIAGNOSIS — Z7901 Long term (current) use of anticoagulants: Secondary | ICD-10-CM | POA: Diagnosis not present

## 2019-09-25 DIAGNOSIS — I4891 Unspecified atrial fibrillation: Secondary | ICD-10-CM | POA: Insufficient documentation

## 2019-09-25 DIAGNOSIS — C50911 Malignant neoplasm of unspecified site of right female breast: Secondary | ICD-10-CM

## 2019-09-25 DIAGNOSIS — Z86718 Personal history of other venous thrombosis and embolism: Secondary | ICD-10-CM | POA: Diagnosis not present

## 2019-09-25 DIAGNOSIS — I2510 Atherosclerotic heart disease of native coronary artery without angina pectoris: Secondary | ICD-10-CM | POA: Diagnosis not present

## 2019-09-25 DIAGNOSIS — Z170 Estrogen receptor positive status [ER+]: Secondary | ICD-10-CM

## 2019-09-25 DIAGNOSIS — I129 Hypertensive chronic kidney disease with stage 1 through stage 4 chronic kidney disease, or unspecified chronic kidney disease: Secondary | ICD-10-CM | POA: Insufficient documentation

## 2019-09-25 DIAGNOSIS — N183 Chronic kidney disease, stage 3 unspecified: Secondary | ICD-10-CM | POA: Insufficient documentation

## 2019-09-25 DIAGNOSIS — C50411 Malignant neoplasm of upper-outer quadrant of right female breast: Secondary | ICD-10-CM | POA: Insufficient documentation

## 2019-09-25 LAB — CMP (CANCER CENTER ONLY)
ALT: 14 U/L (ref 0–44)
AST: 25 U/L (ref 15–41)
Albumin: 3.7 g/dL (ref 3.5–5.0)
Alkaline Phosphatase: 78 U/L (ref 38–126)
Anion gap: 11 (ref 5–15)
BUN: 19 mg/dL (ref 8–23)
CO2: 30 mmol/L (ref 22–32)
Calcium: 10 mg/dL (ref 8.9–10.3)
Chloride: 102 mmol/L (ref 98–111)
Creatinine: 1.41 mg/dL — ABNORMAL HIGH (ref 0.44–1.00)
GFR, Est AFR Am: 41 mL/min — ABNORMAL LOW (ref >60)
GFR, Estimated: 35 mL/min — ABNORMAL LOW (ref >60)
Glucose, Bld: 119 mg/dL — ABNORMAL HIGH (ref 70–99)
Potassium: 4.2 mmol/L (ref 3.5–5.1)
Sodium: 143 mmol/L (ref 135–145)
Total Bilirubin: 1 mg/dL (ref 0.3–1.2)
Total Protein: 7.5 g/dL (ref 6.5–8.1)

## 2019-09-25 LAB — CBC WITH DIFFERENTIAL (CANCER CENTER ONLY)
Abs Immature Granulocytes: 0.02 10*3/uL (ref 0.00–0.07)
Basophils Absolute: 0.1 10*3/uL (ref 0.0–0.1)
Basophils Relative: 1 %
Eosinophils Absolute: 0.1 10*3/uL (ref 0.0–0.5)
Eosinophils Relative: 1 %
HCT: 39.9 % (ref 36.0–46.0)
Hemoglobin: 12.9 g/dL (ref 12.0–15.0)
Immature Granulocytes: 0 %
Lymphocytes Relative: 33 %
Lymphs Abs: 2.6 10*3/uL (ref 0.7–4.0)
MCH: 30.1 pg (ref 26.0–34.0)
MCHC: 32.3 g/dL (ref 30.0–36.0)
MCV: 93 fL (ref 80.0–100.0)
Monocytes Absolute: 0.7 10*3/uL (ref 0.1–1.0)
Monocytes Relative: 9 %
Neutro Abs: 4.3 10*3/uL (ref 1.7–7.7)
Neutrophils Relative %: 56 %
Platelet Count: 229 10*3/uL (ref 150–400)
RBC: 4.29 MIL/uL (ref 3.87–5.11)
RDW: 13.9 % (ref 11.5–15.5)
WBC Count: 7.8 10*3/uL (ref 4.0–10.5)
nRBC: 0 % (ref 0.0–0.2)

## 2019-09-25 LAB — GENETIC SCREENING ORDER

## 2019-09-25 NOTE — H&P (View-Only) (Signed)
History of Present Illness (Sandra Bradford K. Sandra Parrillo MD; 09/25/2019 2:55 PM) The patient is a 78 year old female who presents with breast cancer. PCP - Sandra Bradford Cards - [facility] - Sandra Bradford  This is an 78 year old female who recently underwent routine screening mammogram. This revealed a finding in the RUOQ which underwent further work-up. Stereotactic biopsy confirmed IDC with some adjacent DCIS 1.0 x 1.7 x 1.6 cm in diameter. Axillary US was negative. ER/PR +, Her 2 -, Ki67 15%. The patient has a sister that had ovarian cancer and a niece on her maternal side that had breast cancer. The patient has coronary artery disease and underwent CABG. She remains anticoagulated on Xarelto.  CLINICAL DATA: Patient returns after screening study for evaluation of RIGHT breast calcifications.  EXAM: DIGITAL DIAGNOSTIC RIGHT MAMMOGRAM WITH CAD  COMPARISON: 08/28/2019 and earlier  ACR Breast Density Category b: There are scattered areas of fibroglandular density.  FINDINGS: Magnified views are performed of calcifications in the UPPER-OUTER QUADRANT of the RIGHT breast. On magnified views there is a group of linear and punctate calcifications associated parenchymal density which measures 1.0 x 1.7 x 1.6 centimeters.  Mammographic images were processed with CAD.  IMPRESSION: Indeterminate calcifications and associated parenchymal density in the UPPER-OUTER QUADRANT of the RIGHT breast, warranting tissue diagnosis.  RECOMMENDATION: Recommend stereotactic guided core biopsy of RIGHT breast calcifications.  I have discussed the findings and recommendations with the patient. If applicable, a reminder letter will be sent to the patient regarding the next appointment.  BI-RADS CATEGORY 4: Suspicious.   Electronically Signed By: Sandra Bradford M.D. On: 09/02/2019 16:38  CLINICAL DATA: Patient presents for stereotactic core needle biopsy of a 1.7 cm group of indeterminate  microcalcifications over the posterior third of the right upper outer quadrant.  EXAM: RIGHT BREAST STEREOTACTIC CORE NEEDLE BIOPSY  COMPARISON: Previous exams.  FINDINGS: The patient and I discussed the procedure of stereotactic-guided biopsy including benefits and alternatives. We discussed the high likelihood of a successful procedure. We discussed the risks of the procedure including infection, bleeding, tissue injury, clip migration, and inadequate sampling. Informed written consent was given. The usual time out protocol was performed immediately prior to the procedure.  Using sterile technique and 1% Lidocaine as local anesthetic, under stereotactic guidance, a 9 gauge vacuum assisted device was used to perform core needle biopsy of the targeted microcalcifications over the right upper outer quadrant using a lateral to medial approach. Specimen radiograph was performed showing multiple of the targeted microcalcifications. Specimens with calcifications are identified for pathology.  Lesion quadrant: Right upper outer quadrant.  At the conclusion of the procedure, coil shaped tissue marker clip was deployed into the biopsy cavity. Follow-up 2-view mammogram was performed and dictated separately.  IMPRESSION: Stereotactic-guided biopsy of indeterminate right breast microcalcifications. No apparent complications.  Electronically Signed: By: Sandra Bradford M.D. On: 09/17/2019 10:11  CLINICAL DATA: Recent diagnosis of invasive breast cancer on the right. Ultrasound of right axillary lymph nodes.  EXAM: ULTRASOUND OF THE RIGHT BREAST  COMPARISON: None  FINDINGS: On physical exam, no suspicious lumps are identified.  Targeted ultrasound is performed, showing normal lymph nodes in the right axilla.  IMPRESSION: The right axillary lymph nodes are normal in appearance with no evidence of metastatic disease.  RECOMMENDATION: Recommend continued surgical and  oncologic follow up.  I have discussed the findings and recommendations with the patient. If applicable, a reminder letter will be sent to the patient regarding the next appointment.  BI-RADS CATEGORY   2: Benign.   Electronically Signed By: Sandra Bradford M.D On: 09/20/2019 07:56  CLINICAL DATA: Patient is post stereotactic core needle biopsy of a 1.7 cm group of indeterminate microcalcifications over the posterior third of the upper-outer right breast.  EXAM: DIAGNOSTIC RIGHT MAMMOGRAM POST STEREOTACTIC BIOPSY  COMPARISON: Previous exams.  FINDINGS: Mammographic images were obtained following stereotactic guided biopsy of the targeted group of microcalcifications over the posterior upper outer quadrant. The biopsy marking clip is in expected position at the site of biopsy.  IMPRESSION: Appropriate positioning of the coil shaped biopsy marking clip at the site of biopsy in the location.  Final Assessment: Post Procedure Mammograms for Marker Placement   Electronically Signed By: Sandra Bradford M.D. On: 09/17/2019 10:14    Allergies (Sandra Bradford K. Sandra Plott, MD; 09/25/2019 2:55 PM) Codeine and Related Nausea, Vomiting. Statins Muscle Pain and confusion  Medication History (Sandra Bradford K. Sandra Brier, MD; 09/25/2019 2:55 PM) Medications Reconciled HYDROcodone-Acetaminophen (5-325MG Tablet, Oral) Active. Alendronate Sodium (70MG Tablet, Oral) Active. Allopurinol (100MG Tablet, Oral) Active. Celecoxib (200MG Capsule, Oral) Active. Cyclobenzaprine HCl (5MG Tablet, Oral) Active. Furosemide (20MG Tablet, Oral) Active. Metoprolol Tartrate (50MG Tablet, Oral) Active. Quinapril HCl (10MG Tablet, Oral) Active. Xarelto (15MG Tablet, Oral) Active. Multiple Vitamin (1 (one) Oral) Active.     Physical Exam (Sandra Bradford K. Sandra Turbyfill MD; 09/25/2019 2:57 PM)  The physical exam findings are as follows: Note:Constitutional: WDWN in NAD, conversant, no obvious deformities; resting  comfortably Eyes: Pupils equal, round; sclera anicteric; moist conjunctiva; no lid lag HENT: Oral mucosa moist; good dentition Neck: No masses palpated, trachea midline; no thyromegaly Chest - healed median sternotomy scar Breasts: symmetric, some ecchymosis lateral right breast; no palpable masses; no axillary or supraclavicular lymphadenopathy; no nipple changes or discharge Lungs: CTA bilaterally; normal respiratory effort CV: Regular rate and rhythm; no murmurs; extremities well-perfused with no edema Abd: +bowel sounds, soft, non-tender, no palpable organomegaly; no palpable hernias Musc: Normal gait; no apparent clubbing or cyanosis in extremities Lymphatic: No palpable cervical or axillary lymphadenopathy Skin: Warm, dry; no sign of jaundice Psychiatric - alert and oriented x 4; calm mood and affect    Assessment & Plan (Cerita Rabelo K. Artrell Lawless MD; 09/25/2019 2:59 PM)  INVASIVE DUCTAL CARCINOMA OF BREAST, RIGHT (C50.911) Impression: RUOQ - IDC, Er/PR +, Her2 -, Ki67 15%  Current Plans Schedule for Surgery - Right radioactive seed localized lumpectomy. The surgical procedure has been discussed with the patient. Potential risks, benefits, alternative treatments, and expected outcomes have been explained. All of the patient's questions at this time have been answered. The likelihood of reaching the patient's treatment goal is good. The patient understand the proposed surgical procedure and wishes to proceed. Note:I spent approximately 30 minutes with the patient and her daughter discussing surgical options for treatment. Due to her preexisting coronary artery disease and her age, she is not willing to consider the possibility of chemotherapy. Therefore, there is no benefit for us to perform sentinel lymph node biopsy. We will proceed with lumpectomy, followed by radiation and hormonal therapy. She and her daughter are in agreement with this plan.  Cardiac clearance first.  Karrie Fluellen K.  Kenlee Vogt, MD, FACS [facility]  General/ Trauma Surgery   09/25/2019 3:00 PM  

## 2019-09-25 NOTE — Progress Notes (Signed)
[place]   [phone] Fax:([phone] [place] Note   Patient Care Team: Sharilyn Sites, MD as PCP - General (Family Medicine) Croitoru, Dani Gobble, MD as PCP - Cardiology (Cardiology) Herminio Commons, MD as Attending Physician (Cardiology) Rockwell Germany, RN as Oncology Nurse Navigator Mauro Kaufmann, RN as Oncology Nurse Navigator Kyung Rudd, MD as Consulting Physician (Radiation Oncology) Truitt Merle, MD as Consulting Physician (Hematology) Donnie Mesa, MD as Consulting Physician (General Surgery)  Date of Service:  09/25/2019   CHIEF COMPLAINTS/PURPOSE OF CONSULTATION:  Newly Diagnosed Malignant neoplasm of upper-outer quadrant of right breast    Oncology History Overview Note  Cancer Staging Malignant neoplasm of upper-outer quadrant of right breast in female, estrogen receptor positive ([place]) Staging form: Breast, AJCC 8th Edition - Clinical stage from 09/17/2019: Stage IA (cT1c, cN0, cM0, G2, ER+, PR+, HER2-) - Signed by Truitt Merle, MD on 09/24/2019    Malignant neoplasm of upper-outer quadrant of right breast in female, estrogen receptor positive ([place])  09/02/2019 Mammogram   Diagnostic Mammogram  IMPRESSION: Indeterminate calcifications which measures 1.0 x 1.7 x 1.6 centimeters and associated parenchymal density in the UPPER-OUTER QUADRANT of the RIGHT breast, warranting tissue diagnosis.   09/17/2019 Cancer Staging   Staging form: Breast, AJCC 8th Edition - Clinical stage from 09/17/2019: Stage IA (cT1c, cN0, cM0, G2, ER+, PR+, HER2-) - Signed by Truitt Merle, MD on 09/24/2019   09/17/2019 Initial Biopsy   Diagnosis Breast, right, needle core biopsy, post 1/3 OUQ - INVASIVE MAMMARY CARCINOMA. - MAMMARY CARCINOMA IN SITU WITH ASSOCIATED CALCIFICATIONS. - SEE COMMENT. Microscopic Comment The carcinoma appears grade 2. The longest span of tumor is 0.4 cm. An E-cadherin and a breast prognostic profile will be performed and the  results reported separately. Dr. Vicente Males has reviewed the case and concurs with this interpretation. The results are called to [place] on 09/18/2019.   09/17/2019 Receptors her2   PROGNOSTIC INDICATORS Results: IMMUNOHISTOCHEMICAL AND MORPHOMETRIC ANALYSIS PERFORMED MANUALLY The tumor cells are NEGATIVE for Her2 (0). Estrogen Receptor: 80%, POSITIVE, STRONG STAINING INTENSITY Progesterone Receptor: 70%, POSITIVE, STRONG STAINING INTENSITY Proliferation Marker Ki67: 15%   09/24/2019 Initial Diagnosis   Malignant neoplasm of upper-outer quadrant of right breast in female, estrogen receptor positive ([place])      HISTORY OF PRESENTING ILLNESS:  Sandra Bradford 79 y.o. female is a here because of newly diagnosed right breast cancer. The patient presents to the Breast clinic today accompanied by her daughter. She has hearing aids in place, but notes 1 does not work 100%.   Her mass was found by screening mammogram. She did not feel the mass or other breast pain. She has been having yearly mammograms. She notes 15 yeas ago she only had 1 abnormal mammogram, no biopsy. She notes she is not as active as she once was before [place] pandemic.   Socially she still lives alone and does her house work and yard work. She has lost a few pounds but due to pinched nerve and bulging disc which limited her mobility. Her pain is flared after her 2nd COVID. She is divorced and her daughter does live in the same city.   They have a PMHx of HTN on medication. She notes her arthritis is intermittent. She notes she is on Fosamax due to her weak bone. She had open heart surgery in 2002 with 5 bypasses. She has CAD, Afib and PAD and H/o DVT in right thigh  and foot, unprovoked. She has chronic swelling of her left leg. I reviewed medication list with her, she is on Lasix for LE edema, Xarelto . She had tonsillectomy, cholecystectomy, hysterectomy due to heavy periods. She did not have symptoms after  her Hysterectomy. 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 Her sister had ovarian cancer. Her niece had breast cancer.     GYN HISTORY  Menarchal: 9 LMP: 08/1978 - hysterectomy  Contraceptive: 1 year  HRT: No  G3P3    REVIEW OF SYSTEMS:    Constitutional: Denies fevers, chills or abnormal night sweats Eyes: Denies blurriness of vision, double vision or watery eyes Ears, nose, mouth, throat, and face: Denies mucositis or sore throat Respiratory: Denies cough, dyspnea or wheezes Cardiovascular: Denies palpitation, chest discomfort or lower extremity swelling Gastrointestinal:  Denies nausea, heartburn or change in bowel habits Skin: Denies abnormal skin rashes MSK: (+) Intermittent joint pian  Lymphatics: Denies new lymphadenopathy or easy bruising Neurological:Denies numbness, tingling or new weaknesses Behavioral/Psych: Mood is stable, no new changes  All other systems were reviewed with the patient and are negative.  MEDICAL HISTORY:  Past Medical History:  Diagnosis Date  . A-fib ([place])   . Abnormal nuclear cardiac imaging test 05/01/2014  . Arthritis    "scattered" (06/10/2014)  . Carotid arterial disease ([place])   . Chronic bronchitis ([place])    "several times; haven't had it since I quit working in 2008" (06/10/2014)  . Chronic kidney disease, stage 3   . Coronary artery disease   . Diverticulitis   . DVT (deep venous thrombosis) ([place]) 2001   "related to sitting around when I broke my foot"  . Heart murmur   . History of blood transfusion 01/2001   "related to OHS"   . Hyperlipemia   . PAD (peripheral artery disease) ([place])   . Pneumonia ~ 2010 X 2  . PONV (postoperative nausea and vomiting)   . 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 Systemic hypertension     SURGICAL HISTORY: Past Surgical History:  Procedure Laterality Date  . CARDIAC CATHETERIZATION  2002; 2005  . CARDIOVERSION N/A 05/08/2014   Procedure: CARDIOVERSION;   Surgeon: Sanda Klein, MD;  Location: [facility];  Service: Cardiovascular;  Laterality: N/A;  . CHOLECYSTECTOMY N/A 01/17/2013   Procedure: LAPAROSCOPIC CHOLECYSTECTOMY;  Surgeon: Donato Heinz, MD;  Location: [facility];  Service: General;  Laterality: N/A;  . COLONOSCOPY  July 2007   Dr. Oneida Alar: ascending and sigmoid colon diverticula  . COLONOSCOPY  Dec 2011   Dr. Oneida Alar: ischemic colitis. CTA performed with patent celiac, SMA, and IMA  . COLONOSCOPY N/A 08/06/2013   Dr. Gala Romney: colonic diverticulosis, internal hemorrhoids   . COLONOSCOPY N/A 08/22/2016   Dr. Gala Romney: non-bleeding internal hemorrhoids, diverticulosis in sigmoid colon.   . CORONARY ARTERY BYPASS GRAFT  01/2001   LIMA TO LAD,SVG TO DIAGONAL,SVG TO MARGINAL,SVG SEQUENTALLY TO THE PD & PL VESSEL  . KNEE ARTHROSCOPY Right ~ 1985  . LEFT HEART CATHETERIZATION WITH CORONARY/GRAFT ANGIOGRAM N/A 06/11/2014   Procedure: LEFT HEART CATHETERIZATION WITH Beatrix Fetters;  Surgeon: Peter M Martinique, MD;  Location[facility] CATH LAB;  Service: Cardiovascular;  Laterality: N/A;  . MOHS SURGERY  ~ 04/2013   upper lip  . NM MYOCAR PERF WALL MOTION  08/18/2008   No significant ischemia  . TONSILLECTOMY  1953  . TUBAL LIGATION  ~ 1973  . US ECHOCARDIOGRAPHY  07/04/2008   trace MR,mild TR  .  VAGINAL HYSTERECTOMY  1980    SOCIAL HISTORY: Social History   Socioeconomic History  . Marital status: Divorced    Spouse name: Not on file  . Number of children: 3  . Years of education: Not on file  . Highest education level: Not on file  Occupational History  . Not on file  Tobacco Use  . Smoking status: Never Smoker  . Smokeless tobacco: Never Used  Substance and Sexual Activity  . Alcohol use: Yes    Comment: 06/10/2014 "might have a beer after mowing yard or a drink at holidays"  . Drug use: No  . Sexual activity: Never  Other Topics Concern  . Not on file  Social History Narrative  . Not on file   Social Determinants of Health     Financial Resource Strain:   . Difficulty of Paying Living Expenses:   Food Insecurity:   . Worried About Charity fundraiser in the Last Year:   . Arboriculturist in the Last Year:   Transportation Needs:   . Film/video editor (Medical):   Marland Kitchen Lack of Transportation (Non-Medical):   Physical Activity:   . Days of Exercise per Week:   . Minutes of Exercise per Session:   Stress:   . Feeling of Stress :   Social Connections:   . Frequency of Communication with Friends and Family:   . Frequency of Social Gatherings with Friends and Family:   . Attends Religious Services:   . Active Member of Clubs or Organizations:   . Attends Archivist Meetings:   Marland Kitchen Marital Status:   Intimate Partner Violence:   . Fear of Current or Ex-Partner:   . Emotionally Abused:   Marland Kitchen Physically Abused:   . Sexually Abused:     FAMILY HISTORY: Family History  Problem Relation Age of Onset  . Heart attack Mother   . Heart attack Father   . Congestive Heart Failure Father   . Ovarian cancer Sister   . Breast cancer Niece   . Colon cancer Neg Hx     ALLERGIES:  is allergic to codeine and statins.  MEDICATIONS:  Current Outpatient Medications  Medication Sig Dispense Refill  . acetaminophen (TYLENOL) 325 MG tablet Take 650 mg by mouth every 6 (six) hours as needed for mild pain, moderate pain, fever or headache.     . allopurinol (ZYLOPRIM) 100 MG tablet Take 100 mg by mouth daily.    . colchicine 0.6 MG tablet Take 0.6 mg by mouth daily. Patient takes as needed.    . Evolocumab (REPATHA SURECLICK) 496 MG/ML SOAJ Inject 140 mg into the skin every 14 (fourteen) days. 2 pen 11  . Flaxseed, Linseed, (FLAX SEED OIL PO) Take 1 capsule by mouth daily.    . furosemide (LASIX) 20 MG tablet Take 1 tablet (20 mg total) by mouth daily. 90 tablet 3  . metoprolol tartrate (LOPRESSOR) 50 MG tablet Take 1 tablet (50 mg total) by mouth 2 (two) times daily. 180 tablet 3  . Multiple Vitamin  (MULITIVITAMIN WITH MINERALS) TABS Take 1 tablet by mouth daily.    . quinapril (ACCUPRIL) 10 MG tablet Take 1 tablet (10 mg total) by mouth daily. 30 tablet 11  . Rivaroxaban (XARELTO) 15 MG TABS tablet Take 1 tablet (15 mg total) by mouth daily with supper. 90 tablet 1   No current facility-administered medications for this visit.    PHYSICAL EXAMINATION: ECOG PERFORMANCE STATUS: 1 - Symptomatic but  completely ambulatory  Vitals:   09/25/19 1247  BP: 124/71  Pulse: 84  Resp: 18  Temp: 98.7 F (37.1 C)  SpO2: 96%   Filed Weights   09/25/19 1247  Weight: 170 lb (77.1 kg)    GENERAL:alert, no distress and comfortable SKIN: skin color, texture, turgor are normal, no rashes or significant lesions EYES: normal, Conjunctiva are pink and non-injected, sclera clear  NECK: supple, thyroid normal size, non-tender, without nodularity LYMPH:  no palpable lymphadenopathy in the cervical, axillary  LUNGS: clear to auscultation and percussion with normal breathing effort HEART: regular rate & rhythm and no murmurs (+) lower extremity edema, venous stasis  ABDOMEN:abdomen soft, non-tender and normal bowel sounds Musculoskeletal:no cyanosis of digits and no clubbing  NEURO: alert & oriented x 3 with fluent speech, no focal motor/sensory deficits BREAST: (+) Skin ecchymosis at right breast biopsy site (+) small lump at 7:00 position, likely hematoma form biopsy. Left Breast exam benign.  LABORATORY DATA:  I have reviewed the data as listed CBC Latest Ref Rng & Units 09/25/2019 01/12/2017 08/20/2016  WBC 4.0 - 10.5 K/uL 7.8 7.0 7.2  Hemoglobin 12.0 - 15.0 g/dL 12.9 13.8 14.0  Hematocrit 36.0 - 46.0 % 39.9 41.6 43.0  Platelets 150 - 400 K/uL 229 190 202    CMP Latest Ref Rng & Units 09/25/2019 01/12/2017 08/20/2016  Glucose 70 - 99 mg/dL 119(H) 99 101(H)  BUN 8 - 23 mg/dL 19 20 26(H)  Creatinine 0.44 - 1.00 mg/dL 1.41(H) 1.10(H) 1.26(H)  Sodium 135 - 145 mmol/L 143 136 139  Potassium 3.5 - 5.1  mmol/L 4.2 4.1 4.3  Chloride 98 - 111 mmol/L 102 100(L) 99(L)  CO2 22 - 32 mmol/L 30 27 31   Calcium 8.9 - 10.3 mg/dL 10.0 9.4 9.3  Total Protein 6.5 - 8.1 g/dL 7.5 - -  Total Bilirubin 0.3 - 1.2 mg/dL 1.0 - -  Alkaline Phos 38 - 126 U/L 78 - -  AST 15 - 41 U/L 25 - -  ALT 0 - 44 U/L 14 - -     RADIOGRAPHIC STUDIES: I have personally reviewed the radiological images as listed and agreed with the findings in the report. MM Digital Diagnostic Unilat R  Result Date: 09/02/2019 CLINICAL DATA:  Patient returns after screening study for evaluation of RIGHT breast calcifications. EXAM: DIGITAL DIAGNOSTIC RIGHT MAMMOGRAM WITH CAD COMPARISON:  08/28/2019 and earlier ACR Breast Density Category b: There are scattered areas of fibroglandular density. FINDINGS: Magnified views are performed of calcifications in the UPPER-OUTER QUADRANT of the RIGHT breast. On magnified views there is a group of linear and punctate calcifications associated parenchymal density which measures 1.0 x 1.7 x 1.6 centimeters. Mammographic images were processed with CAD. IMPRESSION: Indeterminate calcifications and associated parenchymal density in the UPPER-OUTER QUADRANT of the RIGHT breast, warranting tissue diagnosis. RECOMMENDATION: Recommend stereotactic guided core biopsy of RIGHT breast calcifications. I have discussed the findings and recommendations with the patient. If applicable, a reminder letter will be sent to the patient regarding the next appointment. BI-RADS CATEGORY  4: Suspicious. Electronically Signed   By: Nolon Nations M.D.   On: 09/02/2019 16:38   [place] AXILLA RIGHT  Result Date: 09/20/2019 CLINICAL DATA:  Recent diagnosis of invasive breast cancer on the right. Ultrasound of right axillary lymph nodes. EXAM: ULTRASOUND OF THE RIGHT BREAST COMPARISON:  None FINDINGS: On physical exam, no suspicious lumps are identified. Targeted ultrasound is performed, showing normal lymph nodes in the right axilla.  IMPRESSION: The right  axillary lymph nodes are normal in appearance with no evidence of metastatic disease. RECOMMENDATION: Recommend continued surgical and oncologic follow up. I have discussed the findings and recommendations with the patient. If applicable, a reminder letter will be sent to the patient regarding the next appointment. BI-RADS CATEGORY  2: Benign. Electronically Signed   By: Dorise Bullion III M.D   On: 09/20/2019 07:56   MM CLIP PLACEMENT RIGHT  Result Date: 09/17/2019 CLINICAL DATA:  Patient is post stereotactic core needle biopsy of a 1.7 cm group of indeterminate microcalcifications over the posterior third of the upper-outer right breast. EXAM: DIAGNOSTIC RIGHT MAMMOGRAM POST STEREOTACTIC BIOPSY COMPARISON:  Previous exams. FINDINGS: Mammographic images were obtained following stereotactic guided biopsy of the targeted group of microcalcifications over the posterior upper outer quadrant. The biopsy marking clip is in expected position at the site of biopsy. IMPRESSION: Appropriate positioning of the coil shaped biopsy marking clip at the site of biopsy in the location. Final Assessment: Post Procedure Mammograms for Marker Placement Electronically Signed   By: Marin Olp M.D.   On: 09/17/2019 10:14   MM RT BREAST BX W LOC DEV 1ST LESION IMAGE BX SPEC STEREO GUIDE  Addendum Date: 09/18/2019   ADDENDUM REPORT: 09/18/2019 13:29 ADDENDUM: Pathology revealed GRADE II INVASIVE MAMMARY CARCINOMA, MAMMARY CARCINOMA IN SITU WITH ASSOCIATED CALCIFICATIONS of the Right breast, posterior 1/3 outer upper quadrant. This was found to be concordant by Dr. Marin Olp. Pathology results were discussed with the patient by telephone. The patient reported doing well after the biopsy with tenderness at the site. Post biopsy instructions and care were reviewed and questions were answered. The patient was encouraged to call [facility] for any additional concerns. The patient  was referred to [facility] on September 25, 2019. The patient is scheduled for a Right axillary ultrasound on Sep 20, 2019 for further evaluation of the lymph nodes due to the invasive histology. Pathology results reported by Terie Purser, RN on 09/18/2019. Electronically Signed   By: Marin Olp M.D.   On: 09/18/2019 13:29   Result Date: 09/18/2019 CLINICAL DATA:  Patient presents for stereotactic core needle biopsy of a 1.7 cm group of indeterminate microcalcifications over the posterior third of the right upper outer quadrant. EXAM: RIGHT BREAST STEREOTACTIC CORE NEEDLE BIOPSY COMPARISON:  Previous exams. FINDINGS: The patient and I discussed the procedure of stereotactic-guided biopsy including benefits and alternatives. We discussed the high likelihood of a successful procedure. We discussed the risks of the procedure including infection, bleeding, tissue injury, clip migration, and inadequate sampling. Informed written consent was given. The usual time out protocol was performed immediately prior to the procedure. Using sterile technique and 1% Lidocaine as local anesthetic, under stereotactic guidance, a 9 gauge vacuum assisted device was used to perform core needle biopsy of the targeted microcalcifications over the right upper outer quadrant using a lateral to medial approach. Specimen radiograph was performed showing multiple of the targeted microcalcifications. Specimens with calcifications are identified for pathology. Lesion quadrant: Right upper outer quadrant. At the conclusion of the procedure, coil shaped tissue marker clip was deployed into the biopsy cavity. Follow-up 2-view mammogram was performed and dictated separately. IMPRESSION: Stereotactic-guided biopsy of indeterminate right breast microcalcifications. No apparent complications. Electronically Signed: By: Marin Olp M.D. On: 09/17/2019 10:11    ASSESSMENT &  PLAN:  Sandra Bradford is a 79 y.o. Caucasian female with a history of  Afib, Arthritis, Carotid Arterial Disease, CKD stage III, CAD, heart murmur, H/o DVT, HLD, PAD, H/o squamous cell cancer of upper lip, HTN.    1. Malignant neoplasm of upper-outer quadrant of right breast, Stage Ia, c(T1cN0M0), ER/PR+/HER2-, Grade II -We discussed her image findings and the biopsy results in great details. 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  -Given the early stage disease, she is a candidate for lumpectomy. Given her age we do not feel strongly she has to proceed with Sentinel LN Biopsy. She is agreeable with that. She was seen by Dr. Georgette Dover today and likely will proceed with surgery soon.  -Given her age, heart disease and early stage disease, chemo is not recommended. She is not interested in chemotherapy. -The risk of recurrence depends on the stage and biology of the tumor. She is early stage, with ER/PR strongly positive and HER2 negative markers. I discussed this is the most common type of slow growing tumor.  -She was also seen by radiation oncologist Dr. Lisbeth Renshaw today.  Adjuvant radiation was discussed, and she is interested. -Given the strong ER and PR expression in her postmenopausal status, I recommend adjuvant endocrine therapy with aromatase inhibitor Anastrozole or Tamoxifen for a total of 5 years to reduce the risk of cancer recurrence. Given her arthritis and low bone density I think tamoxifen is probably a better option.  We discussed tamoxifen can increase risk of blood clots, but she is on Xarelto long term, will monitor.  She has had hysterectomy and there were no risk for endometrial cancer.  Potential benefits and side effects were discussed with patient and she is interested. -We also discussed the breast cancer surveillance after her surgery. She will continue annual screening mammogram, self exam, and a routine office visit  with lab and exam with [place]. -I encouraged her to have healthy diet and exercise regularly -Physical exam showed skin ecchymosis and likely hematoma. Labs reviewed, CBC and CMP WNL except BG 119, Cr 1.41.  -I discussed since she lives closer to [facility] she can be followed there, she will think about it.  -F/u after surgery    2. Atrial Fibrillation, CAD, Heart murmur, HTN, HLD, CKD stage III -She had open heart surgery in 2002 and had 5 bypasses  -She is on lopressor, Xarelto   3. H/o DVT, PAD with LE edema and Venous Stasis  -She had 2 unprovoked DVT (of her thigh and foot). -She is on Lasix and Xarelto.    4. Genetic Testing  -Given her family history of Ovarian and Breast cancer, she is eligible for genetic testing. She is agreeable, will send referral.    5. H/o squamous cell cancer of upper lip, S/p removal    PLAN:  -Send genetic referral.  -Proceed with Surgery soon  -F/u after radiation, will start her on tamoxifen after radiation   No orders of the defined types were placed in this encounter.   All questions were answered. The patient knows to call the clinic with any problems, questions or concerns. The total time spent in the appointment was 45 minutes.     Truitt Merle, MD 09/25/2019 11:23 PM  I, Joslyn Devon, am acting as scribe for Truitt Merle, MD.   I have reviewed the above documentation for accuracy and completeness, and I agree with the above.

## 2019-09-25 NOTE — Progress Notes (Signed)
Radiation Oncology         ([phone]) [phone] ________________________________  Name: Sandra Bradford        MRN: [id number]  Date of Service: 09/25/2019 DOB: October 10, 1940  FF:[id number], Sandra Reichmann, MD  Sandra Mesa, MD     REFERRING PHYSICIAN: Donnie Mesa, MD   DIAGNOSIS: The encounter diagnosis was Malignant neoplasm of upper-outer quadrant of right breast in female, estrogen receptor positive ([place]).   HISTORY OF PRESENT ILLNESS: Sandra Bradford is a 79 y.o. female seen in the multidisciplinary breast clinic for a new diagnosis of right breast cancer. The patient was noted to have screening detected calcifications in the right breast that prompted stereotactic biopsy of a calcification site in the upper outer quadrant. The biopsy on 09/17/19 revealed an invasive ductal carcinoma with associated DCIS. The invasive cancer was grade 2 and her tumor was ER/PR positive, and HER2 negative, with a Ki 67 of 15%. She also had diagnostic ultrasound which measured a solid component within the breast in the calcifications and the whole area measured 1.7 x 1. 6 x 1 cm, and her axilla was negative for adenopathy. She is seen today to discuss treatment recommendations of her cancer.    PREVIOUS RADIATION THERAPY: No   PAST MEDICAL HISTORY:  Past Medical History:  Diagnosis Date  . A-fib ([place])   . Abnormal nuclear cardiac imaging test 05/01/2014  . Arthritis    "scattered" (06/10/2014)  . Carotid arterial disease ([place])   . Chronic bronchitis ([place])    "several times; haven't had it since I quit working in 2008" (06/10/2014)  . Chronic kidney disease, stage 3   . Coronary artery disease   . Diverticulitis   . DVT (deep venous thrombosis) ([place]) 2001   "related to sitting around when I broke my foot"  . Heart murmur   . History of blood transfusion 01/2001   "related to OHS"   . Hyperlipemia   . PAD (peripheral artery disease) ([place])   . Pneumonia ~ 2010 X 2  . PONV (postoperative nausea and vomiting)   . 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 Systemic hypertension        PAST SURGICAL HISTORY: Past Surgical History:  Procedure Laterality Date  . CARDIAC CATHETERIZATION  2002; 2005  . CARDIOVERSION N/A 05/08/2014   Procedure: CARDIOVERSION;  Surgeon: Sandra Klein, MD;  Location: [facility];  Service: Cardiovascular;  Laterality: N/A;  . CHOLECYSTECTOMY N/A 01/17/2013   Procedure: LAPAROSCOPIC CHOLECYSTECTOMY;  Surgeon: Sandra Heinz, MD;  Location: [facility];  Service: General;  Laterality: N/A;  . COLONOSCOPY  July 2007   Dr. Oneida Bradford: ascending and sigmoid colon diverticula  . COLONOSCOPY  Dec 2011   Dr. Oneida Bradford: ischemic colitis. CTA performed with patent celiac, SMA, and IMA  . COLONOSCOPY N/A 08/06/2013   Dr. Gala Bradford: colonic diverticulosis, internal hemorrhoids   . COLONOSCOPY N/A 08/22/2016   Dr. Gala Bradford: non-bleeding internal hemorrhoids, diverticulosis in sigmoid colon.   . CORONARY ARTERY BYPASS GRAFT  01/2001   LIMA TO LAD,SVG TO DIAGONAL,SVG TO MARGINAL,SVG SEQUENTALLY TO THE PD & PL VESSEL  . KNEE ARTHROSCOPY Right ~ 1985  . LEFT HEART CATHETERIZATION WITH CORONARY/GRAFT ANGIOGRAM N/A 06/11/2014   Procedure: LEFT HEART CATHETERIZATION WITH Sandra Bradford;  Surgeon: Sandra M Martinique, MD;  Location: [facility];  Service: Cardiovascular;  Laterality: N/A;  . MOHS SURGERY  ~ 04/2013   upper lip  . NM MYOCAR PERF WALL MOTION  08/18/2008  No significant ischemia  . TONSILLECTOMY  1953  . TUBAL LIGATION  ~ 1973  . US ECHOCARDIOGRAPHY  07/04/2008   trace MR,mild TR  . VAGINAL HYSTERECTOMY  1980     FAMILY HISTORY:  Family History  Problem Relation Age of Onset  . Heart attack Mother   . Heart attack Father   . Congestive Heart Failure Father   . Colon cancer Neg Hx      SOCIAL HISTORY:  reports that she has never smoked. She has never used smokeless tobacco. She reports current alcohol use. She reports that she does not use drugs. The patient is divorced and  lives in [place]. She [place] Her daughter accompanies her today.    ALLERGIES: Codeine and Statins   MEDICATIONS:  Current Outpatient Medications  Medication Sig Dispense Refill  . acetaminophen (TYLENOL) 325 MG tablet Take 650 mg by mouth every 6 (six) hours as needed for mild pain, moderate pain, fever or headache.     . allopurinol (ZYLOPRIM) 100 MG tablet Take 100 mg by mouth daily.    . colchicine 0.6 MG tablet Take 0.6 mg by mouth daily. Patient takes as needed.    . Evolocumab (REPATHA SURECLICK) 742 MG/ML SOAJ Inject 140 mg into the skin every 14 (fourteen) days. 2 pen 11  . Flaxseed, Linseed, (FLAX SEED OIL PO) Take 1 capsule by mouth daily.    . furosemide (LASIX) 20 MG tablet Take 1 tablet (20 mg total) by mouth daily. 90 tablet 3  . metoprolol tartrate (LOPRESSOR) 50 MG tablet Take 1 tablet (50 mg total) by mouth 2 (two) times daily. 180 tablet 3  . Multiple Vitamin (MULITIVITAMIN WITH MINERALS) TABS Take 1 tablet by mouth daily.    . niacin 500 MG tablet Take by mouth.    . predniSONE (DELTASONE) 10 MG tablet Patient takes as needed.    . quinapril (ACCUPRIL) 10 MG tablet Take 1 tablet (10 mg total) by mouth daily. 30 tablet 11  . Rivaroxaban (XARELTO) 15 MG TABS tablet Take 1 tablet (15 mg total) by mouth daily with supper. 90 tablet 1   No current facility-administered medications for this encounter.     REVIEW OF SYSTEMS: On review of systems, the patient reports that she is doing well overall. She denies any breast pain, nipple bleeding or discharge is noted. No other complaints are noted.    PHYSICAL EXAM:  Wt Readings from Last 3 Encounters:  04/05/19 173 lb (78.5 kg)  04/05/18 169 lb (76.7 kg)  03/30/17 177 lb 6.4 oz (80.5 kg)   Temp Readings from Last 3 Encounters:  04/05/19 (!) 97.1 F (36.2 C)  01/12/17 97.6 F (36.4 C) (Oral)  10/17/16 98.4 F (36.9 C) (Oral)   BP Readings from Last 3 Encounters:  04/05/19 (!)  151/79  04/05/18 106/72  03/30/17 122/78   Pulse Readings from Last 3 Encounters:  04/05/19 95  04/05/18 81  03/30/17 80    In general this is a well appearing caucasian female in no acute distress. She's alert and oriented x4 and appropriate throughout the examination. Cardiopulmonary assessment is negative for acute distress and she exhibits normal effort. Bilateral breast exam is deferred.    ECOG = 0  0 - Asymptomatic (Fully active, able to carry on all predisease activities without restriction)  1 - Symptomatic but completely ambulatory (Restricted in physically strenuous activity but ambulatory and able to carry out work of a light or sedentary  nature. For example, light housework, office work)  2 - Symptomatic, <50% in bed during the day (Ambulatory and capable of all self care but unable to carry out any work activities. Up and about more than 50% of waking hours)  3 - Symptomatic, >50% in bed, but not bedbound (Capable of only limited self-care, confined to bed or chair 50% or more of waking hours)  4 - Bedbound (Completely disabled. Cannot carry on any self-care. Totally confined to bed or chair)  5 - Death   Eustace Pen MM, Creech RH, Tormey DC, et al. [phone]). "Toxicity and response criteria of the [facility]". [place] Oncol. 5 (6): 649-55    LABORATORY DATA:  Lab Results  Component Value Date   WBC 7.0 01/12/2017   HGB 13.8 01/12/2017   HCT 41.6 01/12/2017   MCV 90.6 01/12/2017   PLT 190 01/12/2017   Lab Results  Component Value Date   NA 136 01/12/2017   K 4.1 01/12/2017   CL 100 (L) 01/12/2017   CO2 27 01/12/2017   Lab Results  Component Value Date   ALT 21 05/20/2016   AST 33 05/20/2016   ALKPHOS 55 05/20/2016   BILITOT 1.1 05/20/2016      RADIOGRAPHY: MM Digital Diagnostic Unilat R  Result Date: 09/02/2019 CLINICAL DATA:  Patient returns after screening study for evaluation of RIGHT breast calcifications. EXAM: DIGITAL  DIAGNOSTIC RIGHT MAMMOGRAM WITH CAD COMPARISON:  08/28/2019 and earlier ACR Breast Density Category b: There are scattered areas of fibroglandular density. FINDINGS: Magnified views are performed of calcifications in the UPPER-OUTER QUADRANT of the RIGHT breast. On magnified views there is a group of linear and punctate calcifications associated parenchymal density which measures 1.0 x 1.7 x 1.6 centimeters. Mammographic images were processed with CAD. IMPRESSION: Indeterminate calcifications and associated parenchymal density in the UPPER-OUTER QUADRANT of the RIGHT breast, warranting tissue diagnosis. RECOMMENDATION: Recommend stereotactic guided core biopsy of RIGHT breast calcifications. I have discussed the findings and recommendations with the patient. If applicable, a reminder letter will be sent to the patient regarding the next appointment. BI-RADS CATEGORY  4: Suspicious. Electronically Signed   By: Nolon Nations M.D.   On: 09/02/2019 16:38   [place] AXILLA RIGHT  Result Date: 09/20/2019 CLINICAL DATA:  Recent diagnosis of invasive breast cancer on the right. Ultrasound of right axillary lymph nodes. EXAM: ULTRASOUND OF THE RIGHT BREAST COMPARISON:  None FINDINGS: On physical exam, no suspicious lumps are identified. Targeted ultrasound is performed, showing normal lymph nodes in the right axilla. IMPRESSION: The right axillary lymph nodes are normal in appearance with no evidence of metastatic disease. RECOMMENDATION: Recommend continued surgical and oncologic follow up. I have discussed the findings and recommendations with the patient. If applicable, a reminder letter will be sent to the patient regarding the next appointment. BI-RADS CATEGORY  2: Benign. Electronically Signed   By: Dorise Bullion III M.D   On: 09/20/2019 07:56   MM CLIP PLACEMENT RIGHT  Result Date: 09/17/2019 CLINICAL DATA:  Patient is post stereotactic core needle biopsy of a 1.7 cm group of indeterminate microcalcifications  over the posterior third of the upper-outer right breast. EXAM: DIAGNOSTIC RIGHT MAMMOGRAM POST STEREOTACTIC BIOPSY COMPARISON:  Previous exams. FINDINGS: Mammographic images were obtained following stereotactic guided biopsy of the targeted group of microcalcifications over the posterior upper outer quadrant. The biopsy marking clip is in expected position at the site of biopsy. IMPRESSION: Appropriate positioning of the coil shaped biopsy marking clip at  the site of biopsy in the location. Final Assessment: Post Procedure Mammograms for Marker Placement Electronically Signed   By: Marin Olp M.D.   On: 09/17/2019 10:14   MM RT BREAST BX W LOC DEV 1ST LESION IMAGE BX SPEC STEREO GUIDE  Addendum Date: 09/18/2019   ADDENDUM REPORT: 09/18/2019 13:29 ADDENDUM: Pathology revealed GRADE II INVASIVE MAMMARY CARCINOMA, MAMMARY CARCINOMA IN SITU WITH ASSOCIATED CALCIFICATIONS of the Right breast, posterior 1/3 outer upper quadrant. This was found to be concordant by Dr. Marin Olp. Pathology results were discussed with the patient by telephone. The patient reported doing well after the biopsy with tenderness at the site. Post biopsy instructions and care were reviewed and questions were answered. The patient was encouraged to call [facility] for any additional concerns. The patient was referred to [facility] on September 25, 2019. The patient is scheduled for a Right axillary ultrasound on Sep 20, 2019 for further evaluation of the lymph nodes due to the invasive histology. Pathology results reported by Terie Purser, RN on 09/18/2019. Electronically Signed   By: Marin Olp M.D.   On: 09/18/2019 13:29   Result Date: 09/18/2019 CLINICAL DATA:  Patient presents for stereotactic core needle biopsy of a 1.7 cm group of indeterminate microcalcifications over the posterior third of the right upper outer quadrant. EXAM:  RIGHT BREAST STEREOTACTIC CORE NEEDLE BIOPSY COMPARISON:  Previous exams. FINDINGS: The patient and I discussed the procedure of stereotactic-guided biopsy including benefits and alternatives. We discussed the high likelihood of a successful procedure. We discussed the risks of the procedure including infection, bleeding, tissue injury, clip migration, and inadequate sampling. Informed written consent was given. The usual time out protocol was performed immediately prior to the procedure. Using sterile technique and 1% Lidocaine as local anesthetic, under stereotactic guidance, a 9 gauge vacuum assisted device was used to perform core needle biopsy of the targeted microcalcifications over the right upper outer quadrant using a lateral to medial approach. Specimen radiograph was performed showing multiple of the targeted microcalcifications. Specimens with calcifications are identified for pathology. Lesion quadrant: Right upper outer quadrant. At the conclusion of the procedure, coil shaped tissue marker clip was deployed into the biopsy cavity. Follow-up 2-view mammogram was performed and dictated separately. IMPRESSION: Stereotactic-guided biopsy of indeterminate right breast microcalcifications. No apparent complications. Electronically Signed: By: Marin Olp M.D. On: 09/17/2019 10:11       IMPRESSION/PLAN: 1. Stage IA, [id number] grade 2, ER/PR positive invasive ductal carcinoma of the right breast. Dr. Lisbeth Renshaw discusses the pathology findings and reviews the nature of right breast disease. The consensus from the breast conference includes breast conservation with lumpectomy with sentinel node biopsy. She would likelybenefit from external radiotherapy to the breast followed by antiestrogen therapy. Dr. Lisbeth Renshaw did discuss certain populations of patients who can avoid radiotherapy and we will review her final pathology results to determine this. We discussed the risks, benefits, short, and long term effects of  radiotherapy, and the patient is interested in considering her options. Dr. Lisbeth Renshaw discusses the delivery and logistics of radiotherapy and anticipates a course of 4- 6 1/2 weeks of radiotherapy, leaning toward 4 weeks based on her work up to date. We will see her back about 2 weeks after surgery to discuss the simulation process and anticipate we would start radiation therapy about 4-6 weeks after surgery.    In a visit lasting 45 minutes, greater than 50% of  the time was spent face to face reviewing her case, as well as in preparation of, discussing, and coordinating the patient's care.  The above documentation reflects my direct findings during this shared patient visit. Please see the separate note by Dr. Lisbeth Renshaw on this date for the remainder of the patient's plan of care.    Carola Rhine, PAC

## 2019-09-25 NOTE — Progress Notes (Signed)
Sandra Bradford Psychosocial Distress Screening Spiritual Care  Met with Sandra Bradford and her daughter Sandra Bradford in [facility] to introduce [facility] team/resources, reviewing distress screen per protocol.  The patient scored a 5 on the Psychosocial Distress Thermometer which indicates moderate distress. Also assessed for distress and other psychosocial needs.   ONCBCN DISTRESS SCREENING 09/25/2019  Screening Type Initial Screening  Distress experienced in past week (1-10) 5  Emotional problem type Adjusting to illness;Adjusting to appearance changes  Physical Problem type Bathing/dressing  Referral to support programs Yes   Sandra Bradford was in good spirits and very complimentary of [place] during this encounter. Meeting team and learning treatment plan helped reduce distress. Provided empathic listening, normalization of feelings, and emotional support. Introduced Chemical engineer.   Follow up needed: No. Per Sandra Bradford, no other needs or concerns at this time, but please do page if needs arise or circumstances change. Thank you.   [place], [place], [facility] Pager [phone] Voicemail [phone]

## 2019-09-25 NOTE — H&P (Signed)
History of Present Illness Sandra Bradford. Sandra Yellin MD; 09/25/2019 2:55 PM) The patient is a 79 year old female who presents with breast cancer. PCP - Sandra Bradford Cards - [facility] - Sandra Bradford  This is an 79 year old female who recently underwent routine screening mammogram. This revealed a finding in the RUOQ which underwent further work-up. Stereotactic biopsy confirmed IDC with some adjacent DCIS 1.0 x 1.7 x 1.6 cm in diameter. Axillary [place] was negative. ER/PR +, Her 2 -, Ki67 15%. The patient has a sister that had ovarian cancer and a niece on her maternal side that had breast cancer. The patient has coronary artery disease and underwent CABG. She remains anticoagulated on Xarelto.  CLINICAL DATA: Patient returns after screening study for evaluation of RIGHT breast calcifications.  EXAM: DIGITAL DIAGNOSTIC RIGHT MAMMOGRAM WITH CAD  COMPARISON: 08/28/2019 and earlier  ACR Breast Density Category b: There are scattered areas of fibroglandular density.  FINDINGS: Magnified views are performed of calcifications in the UPPER-OUTER QUADRANT of the RIGHT breast. On magnified views there is a group of linear and punctate calcifications associated parenchymal density which measures 1.0 x 1.7 x 1.6 centimeters.  Mammographic images were processed with CAD.  IMPRESSION: Indeterminate calcifications and associated parenchymal density in the UPPER-OUTER QUADRANT of the RIGHT breast, warranting tissue diagnosis.  RECOMMENDATION: Recommend stereotactic guided core biopsy of RIGHT breast calcifications.  I have discussed the findings and recommendations with the patient. If applicable, a reminder letter will be sent to the patient regarding the next appointment.  BI-RADS CATEGORY 4: Suspicious.   Electronically Signed By: Sandra Bradford M.D. On: 09/02/2019 16:38  CLINICAL DATA: Patient presents for stereotactic core needle biopsy of a 1.7 cm group of indeterminate  microcalcifications over the posterior third of the right upper outer quadrant.  EXAM: RIGHT BREAST STEREOTACTIC CORE NEEDLE BIOPSY  COMPARISON: Previous exams.  FINDINGS: The patient and I discussed the procedure of stereotactic-guided biopsy including benefits and alternatives. We discussed the high likelihood of a successful procedure. We discussed the risks of the procedure including infection, bleeding, tissue injury, clip migration, and inadequate sampling. Informed written consent was given. The usual time out protocol was performed immediately prior to the procedure.  Using sterile technique and 1% Lidocaine as local anesthetic, under stereotactic guidance, a 9 gauge vacuum assisted device was used to perform core needle biopsy of the targeted microcalcifications over the right upper outer quadrant using a lateral to medial approach. Specimen radiograph was performed showing multiple of the targeted microcalcifications. Specimens with calcifications are identified for pathology.  Lesion quadrant: Right upper outer quadrant.  At the conclusion of the procedure, coil shaped tissue marker clip was deployed into the biopsy cavity. Follow-up 2-view mammogram was performed and dictated separately.  IMPRESSION: Stereotactic-guided biopsy of indeterminate right breast microcalcifications. No apparent complications.  Electronically Signed: By: Sandra Bradford M.D. On: 09/17/2019 10:11  CLINICAL DATA: Recent diagnosis of invasive breast cancer on the right. Ultrasound of right axillary lymph nodes.  EXAM: ULTRASOUND OF THE RIGHT BREAST  COMPARISON: None  FINDINGS: On physical exam, no suspicious lumps are identified.  Targeted ultrasound is performed, showing normal lymph nodes in the right axilla.  IMPRESSION: The right axillary lymph nodes are normal in appearance with no evidence of metastatic disease.  RECOMMENDATION: Recommend continued surgical and  oncologic follow up.  I have discussed the findings and recommendations with the patient. If applicable, a reminder letter will be sent to the patient regarding the next appointment.  BI-RADS CATEGORY  2: Benign.   Electronically Signed By: Sandra Bradford M.D On: 09/20/2019 07:56  CLINICAL DATA: Patient is post stereotactic core needle biopsy of a 1.7 cm group of indeterminate microcalcifications over the posterior third of the upper-outer right breast.  EXAM: DIAGNOSTIC RIGHT MAMMOGRAM POST STEREOTACTIC BIOPSY  COMPARISON: Previous exams.  FINDINGS: Mammographic images were obtained following stereotactic guided biopsy of the targeted group of microcalcifications over the posterior upper outer quadrant. The biopsy marking clip is in expected position at the site of biopsy.  IMPRESSION: Appropriate positioning of the coil shaped biopsy marking clip at the site of biopsy in the location.  Final Assessment: Post Procedure Mammograms for Marker Placement   Electronically Signed By: Sandra Bradford M.D. On: 09/17/2019 10:14    Allergies Sandra Bradford K. Sandra Heffernan, MD; 09/25/2019 2:55 PM) Codeine and Related Nausea, Vomiting. Statins Muscle Pain and confusion  Medication History Sandra Bradford K. Sandra Ryland, MD; 09/25/2019 2:55 PM) Medications Reconciled HYDROcodone-Acetaminophen (5-325MG Tablet, Oral) Active. Alendronate Sodium (70MG Tablet, Oral) Active. Allopurinol (100MG Tablet, Oral) Active. Celecoxib (200MG Capsule, Oral) Active. Cyclobenzaprine HCl (5MG Tablet, Oral) Active. Furosemide (20MG Tablet, Oral) Active. Metoprolol Tartrate (50MG Tablet, Oral) Active. Quinapril HCl (10MG Tablet, Oral) Active. Xarelto (15MG Tablet, Oral) Active. Multiple Vitamin (1 (one) Oral) Active.     Physical Exam Sandra Bradford K. Sandra Zumstein MD; 09/25/2019 2:57 PM)  The physical exam findings are as follows: Note:Constitutional: WDWN in NAD, conversant, no obvious deformities; resting  comfortably Eyes: Pupils equal, round; sclera anicteric; moist conjunctiva; no lid lag HENT: Oral mucosa moist; good dentition Neck: No masses palpated, trachea midline; no thyromegaly Chest - healed median sternotomy scar Breasts: symmetric, some ecchymosis lateral right breast; no palpable masses; no axillary or supraclavicular lymphadenopathy; no nipple changes or discharge Lungs: CTA bilaterally; normal respiratory effort CV: Regular rate and rhythm; no murmurs; extremities well-perfused with no edema Abd: +bowel sounds, soft, non-tender, no palpable organomegaly; no palpable hernias Musc: Normal gait; no apparent clubbing or cyanosis in extremities Lymphatic: No palpable cervical or axillary lymphadenopathy Skin: Warm, dry; no sign of jaundice Psychiatric - alert and oriented x 4; calm mood and affect    Assessment & Plan Sandra Bradford K. Chrishawna Farina MD; 09/25/2019 2:59 PM)  INVASIVE DUCTAL CARCINOMA OF BREAST, RIGHT (C50.911) Impression: RUOQ - IDC, Er/PR +, Her2 -, Ki67 15%  Current Plans Schedule for Surgery - Right radioactive seed localized lumpectomy. The surgical procedure has been discussed with the patient. Potential risks, benefits, alternative treatments, and expected outcomes have been explained. All of the patient's questions at this time have been answered. The likelihood of reaching the patient's treatment goal is good. The patient understand the proposed surgical procedure and wishes to proceed. Note:I spent approximately 30 minutes with the patient and her daughter discussing surgical options for treatment. Due to her preexisting coronary artery disease and her age, she is not willing to consider the possibility of chemotherapy. Therefore, there is no benefit for [place] to perform sentinel lymph node biopsy. We will proceed with lumpectomy, followed by radiation and hormonal therapy. She and her daughter are in agreement with this plan.  Cardiac clearance first.  Sandra Bradford.  Georgette Dover, MD, [facility]  General/ Trauma Surgery   09/25/2019 3:00 PM

## 2019-09-27 ENCOUNTER — Encounter: Payer: Self-pay | Admitting: *Deleted

## 2019-09-30 ENCOUNTER — Other Ambulatory Visit: Payer: Self-pay | Admitting: Family Medicine

## 2019-09-30 ENCOUNTER — Other Ambulatory Visit: Payer: Self-pay | Admitting: Surgery

## 2019-09-30 DIAGNOSIS — C50911 Malignant neoplasm of unspecified site of right female breast: Secondary | ICD-10-CM

## 2019-10-01 ENCOUNTER — Telehealth: Payer: Self-pay | Admitting: *Deleted

## 2019-10-01 NOTE — Telephone Encounter (Signed)
   Primary Cardiologist: Sanda Klein, MD  Chart reviewed as part of pre-operative protocol coverage. Patient was last seen by Dr. Sallyanne Kuster on 04/05/2019 at which time she was doing well from a cardiac standpoint. Patient contacted on 10/01/2019 for pre-op risk assessment. She reports not significant changes since last visit. She does report some shortness of breath with activity but she does not feel like this is new or worse since her last office visit. No chest pain or shortness of breath at rest. No orthopnea or PND. She has some chronic lower extremity edema but states this is stable. She has permanent atrial fibrillation and occasionally has palpitations but none recently. No dizziness or syncope. Able to complete >4.0 METS of activity. Per Revised Cardiac Risk Index, considered low risk (although this does not take patient's age into account). Given past medical history and time since last visit, based on ACC/AHA guidelines, Sandra Bradford would be at acceptable risk for the planned procedure without further cardiovascular testing. Do not want to delay lumpectomy given that this is for treatment of malignancy.   Per pharmacy and office protocol, patient can hold Xarelto for 2 days prior to procedure. This should be restarted as soon as possible afterwards.  I will route this recommendation to the requesting party via Epic fax function and remove from pre-op pool.  Please call with questions.  Darreld Mclean, PA-C 10/01/2019, 4:54 PM

## 2019-10-01 NOTE — Telephone Encounter (Signed)
° °  [facility] Pre-operative Risk Assessment    HEARTCARE STAFF: - Please ensure there is not already an duplicate clearance open for this procedure. - Under Visit Info/Reason for Call, type in Other and utilize the format Clearance MM/DD/YY or Clearance TBD. Do not use dashes or single digits. - If request is for dental extraction, please clarify the # of teeth to be extracted.  Request for surgical clearance:  1. What type of surgery is being performed? RIGHT LUMPECTOMY  2. When is this surgery scheduled? TBD   3. What type of clearance is required (medical clearance vs. Pharmacy clearance to hold med vs. Both)? BOTH  4. Are there any medications that need to be held prior to surgery and how long?[place]   5. Practice name and name of physician performing surgery?  [facility]   6. What is the office phone number?  [phone]   7.   What is the office fax number?  [phone]  8.   Anesthesia type (None, local, MAC, general) ?  GENERAL    Devra Dopp 10/01/2019, 9:36 AM  _________________________________________________________________   (provider comments below)

## 2019-10-01 NOTE — Telephone Encounter (Signed)
Patient with diagnosis of A Fib on Xarelto for anticoagulation.    Procedure: Right lumpectomy Date of procedure: TBD  CHADS2-VASc score of  5 ( HTN, CAD, AGE x 2, female)  CrCl 39 mL/min using actual body weight   Per office protocol, patient can hold Xarelto for 2 days prior to procedure.

## 2019-10-01 NOTE — Telephone Encounter (Signed)
Left message to call back and ask to speak with the pre-op team. 

## 2019-10-01 NOTE — Telephone Encounter (Signed)
Pharmacy, can you please comment on how long patient can hold Xarelto for upcoming procedure?  Thank you! 

## 2019-10-03 ENCOUNTER — Encounter: Payer: Self-pay | Admitting: *Deleted

## 2019-10-03 ENCOUNTER — Telehealth: Payer: Self-pay | Admitting: *Deleted

## 2019-10-03 DIAGNOSIS — C50411 Malignant neoplasm of upper-outer quadrant of right female breast: Secondary | ICD-10-CM

## 2019-10-03 NOTE — Telephone Encounter (Signed)
Spoke to pt concerning [place] from 6.2.21. Denies questions or concerns regarding dx or treatment care plan. Encourage pt to call with needs. Received verbal understanding.

## 2019-10-11 ENCOUNTER — Encounter: Payer: Self-pay | Admitting: *Deleted

## 2019-10-14 ENCOUNTER — Encounter (HOSPITAL_COMMUNITY): Payer: Self-pay

## 2019-10-14 ENCOUNTER — Other Ambulatory Visit: Payer: Self-pay

## 2019-10-14 ENCOUNTER — Other Ambulatory Visit (HOSPITAL_COMMUNITY)
Admission: RE | Admit: 2019-10-14 | Discharge: 2019-10-14 | Disposition: A | Discharge status: Home / Self Care | Payer: PPO | Source: Ambulatory Visit | Attending: Surgery | Admitting: Surgery

## 2019-10-14 ENCOUNTER — Encounter (HOSPITAL_COMMUNITY)
Admission: RE | Admit: 2019-10-14 | Discharge: 2019-10-14 | Disposition: A | Discharge status: Home / Self Care | Payer: PPO | Source: Ambulatory Visit | Attending: Surgery | Admitting: Surgery

## 2019-10-14 DIAGNOSIS — N183 Chronic kidney disease, stage 3 unspecified: Secondary | ICD-10-CM | POA: Insufficient documentation

## 2019-10-14 DIAGNOSIS — Z86718 Personal history of other venous thrombosis and embolism: Secondary | ICD-10-CM | POA: Insufficient documentation

## 2019-10-14 DIAGNOSIS — I129 Hypertensive chronic kidney disease with stage 1 through stage 4 chronic kidney disease, or unspecified chronic kidney disease: Secondary | ICD-10-CM | POA: Insufficient documentation

## 2019-10-14 DIAGNOSIS — Z7901 Long term (current) use of anticoagulants: Secondary | ICD-10-CM | POA: Insufficient documentation

## 2019-10-14 DIAGNOSIS — C50911 Malignant neoplasm of unspecified site of right female breast: Secondary | ICD-10-CM | POA: Insufficient documentation

## 2019-10-14 DIAGNOSIS — Z01812 Encounter for preprocedural laboratory examination: Secondary | ICD-10-CM | POA: Diagnosis not present

## 2019-10-14 DIAGNOSIS — I739 Peripheral vascular disease, unspecified: Secondary | ICD-10-CM | POA: Insufficient documentation

## 2019-10-14 DIAGNOSIS — Z79899 Other long term (current) drug therapy: Secondary | ICD-10-CM | POA: Insufficient documentation

## 2019-10-14 DIAGNOSIS — Z20822 Contact with and (suspected) exposure to covid-19: Secondary | ICD-10-CM | POA: Diagnosis not present

## 2019-10-14 DIAGNOSIS — I4821 Permanent atrial fibrillation: Secondary | ICD-10-CM | POA: Insufficient documentation

## 2019-10-14 DIAGNOSIS — Z7983 Long term (current) use of bisphosphonates: Secondary | ICD-10-CM | POA: Insufficient documentation

## 2019-10-14 DIAGNOSIS — Z01818 Encounter for other preprocedural examination: Secondary | ICD-10-CM | POA: Insufficient documentation

## 2019-10-14 HISTORY — DX: Dyspnea, unspecified: R06.00

## 2019-10-14 HISTORY — DX: Cardiac arrhythmia, unspecified: I49.9

## 2019-10-14 HISTORY — DX: Unspecified injury of head, initial encounter: S09.90XA

## 2019-10-14 LAB — CBC
HCT: 38.6 % (ref 36.0–46.0)
Hemoglobin: 12.4 g/dL (ref 12.0–15.0)
MCH: 30.3 pg (ref 26.0–34.0)
MCHC: 32.1 g/dL (ref 30.0–36.0)
MCV: 94.4 fL (ref 80.0–100.0)
Platelets: 259 10*3/uL (ref 150–400)
RBC: 4.09 MIL/uL (ref 3.87–5.11)
RDW: 13.6 % (ref 11.5–15.5)
WBC: 7.1 10*3/uL (ref 4.0–10.5)
nRBC: 0 % (ref 0.0–0.2)

## 2019-10-14 LAB — BASIC METABOLIC PANEL
Anion gap: 12 (ref 5–15)
BUN: 16 mg/dL (ref 8–23)
CO2: 28 mmol/L (ref 22–32)
Calcium: 9 mg/dL (ref 8.9–10.3)
Chloride: 103 mmol/L (ref 98–111)
Creatinine, Ser: 1.38 mg/dL — ABNORMAL HIGH (ref 0.44–1.00)
GFR calc Af Amer: 42 mL/min — ABNORMAL LOW (ref >60)
GFR calc non Af Amer: 36 mL/min — ABNORMAL LOW (ref >60)
Glucose, Bld: 143 mg/dL — ABNORMAL HIGH (ref 70–99)
Potassium: 4 mmol/L (ref 3.5–5.1)
Sodium: 143 mmol/L (ref 135–145)

## 2019-10-14 LAB — SARS CORONAVIRUS 2 (TAT 6-24 HRS): SARS Coronavirus 2: NEGATIVE

## 2019-10-14 NOTE — Pre-Procedure Instructions (Signed)
Carlyne JANAISA BIRKLAND  10/14/2019    Your procedure is scheduled on Thursday, June 24.  Report to [facility], Main Entrance or Entrance "A" at 5:30 AM                     Your surgery or procedure is scheduled to begin at 7:30 A.M.   Call this number if you have problems the morning of surgery: For any other questions, please call [phone], Monday - Friday 8 AM - 4 PM.                    For any other questions, please call [phone], Monday - Friday 8 AM - 4 PM.    Remember:  Do not eat after midnight Wednesday.  You may drink clear liquids until 4:30 AM .   Clear liquids allowed are:  Water, Juice (non-citric and without pulp - diabetics please choose diet or no sugar options), Carbonated beverages - (diabetics please choose diet or no sugar options), Clear Tea, Black Coffee only (no creamer, milk or cream including half and half), Plain Jell-O only (diabetics please choose diet or no sugar options), Gatorade (diabetics please choose diet or no sugar options) and Plain Popsicles only    Take these medicines the morning of surgery with A SIP OF WATER :  allopurinol (ZYLOPRIM)              metoprolol tartrate (LOPRESSOR)  If needed: acetaminophen (TYLENOL)  Follow your surgeons instructions regarding :Rivaroxaban Alveda Reasons)  Special instructions:  [place]- Preparing For Surgery  Before surgery, you can play an important role. Because skin is not sterile, your skin needs to be as free of germs as possible. You can reduce the number of germs on your skin by washing with CHG (chlorahexidine gluconate) Soap before surgery.  CHG is an antiseptic cleaner which kills germs and bonds with the skin to continue killing germs even after washing.    Oral Hygiene is also important to reduce your risk of infection.  Remember - BRUSH YOUR TEETH THE MORNING OF SURGERY WITH YOUR REGULAR TOOTHPASTE  Please do not use if you have an allergy to CHG or antibacterial soaps. If your skin  becomes reddened/irritated stop using the CHG.  Do not shave (including legs and underarms) for at least 48 hours prior to first CHG shower. It is OK to shave your face.  Please follow these instructions carefully.   1. Shower the NIGHT BEFORE SURGERY and the MORNING OF SURGERY with CHG.   2. If you chose to wash your hair, wash your hair first as usual with your normal shampoo.  3. After you shampoo, rinse your hair and body thoroughly to remove the shampoo.  4. Use CHG as you would any other liquid soap. You can apply CHG directly to the skin and wash gently with a scrungie or a clean washcloth.   5. Apply the CHG Soap to your body ONLY FROM THE NECK DOWN.  Do not use on open wounds or open sores. Avoid contact with your eyes, ears, mouth and genitals (private parts). Wash Face and genitals (private parts)  with your normal soap.  6. Wash thoroughly, paying special attention to the area where your surgery will be performed.  7. Thoroughly rinse your body with warm water from the neck down.  8. DO NOT shower/wash with your normal soap after using and rinsing off the CHG Soap.  9. Pat yourself  dry with a CLEAN TOWEL.  10. Wear CLEAN PAJAMAS to bed the night before surgery, wear comfortable clothes the morning of surgery  11. Place CLEAN SHEETS on your bed the night of your first shower and DO NOT SLEEP WITH PETS.   Day of Surgery: Shower as instructed above.  Do not wear lotions, powders, or perfumes, or deodorant. Please wear clean clothes to the hospital/surgery center.   Remember to brush your teeth WITH YOUR REGULAR TOOTHPASTE  Do not wear jewelry, make-up or nail polish.  Do not shave 48 hours prior to surgery.    Do not bring valuables to the hospital.  [facility] is not responsible for any belongings or valuables.  Contacts, dentures or bridgework may not be worn into surgery.  Leave your suitcase in the car.  After surgery it may be brought to your room.  For patients  admitted to the hospital, discharge time will be determined by your treatment team.  Patients discharged the day of surgery will not be allowed to drive home.   Please read over the fact sheets that you were given.

## 2019-10-14 NOTE — Progress Notes (Addendum)
PCP: Dr. Nancy Nordmann, [facility]  Cardiologist - Dr. Kathlene Cote  Nephrologist: Dr. Moshe Cipro  Chest x-ray - na  EKG - 04/05/2019  Stress Test - 2016  ECHO - 2018  Cardiac Cath -   Sleep Study - no CPAP - no  LABS-CBC, BMP.  ASA-no  Sandra Bradford - last day- 6/21  ERAS-yes- clears until 0430  HA1C-na Fasting Blood Sugar - na Checks Blood Sugar ____na_ times a day  Anesthesia-  Pt denies having chest pain, sob, or fever at this time. All instructions explained to the pt, with a verbal understanding of the material. Pt agrees to go over the instructions while at home for a better understanding. Pt also instructed to self quarantine after being tested for COVID-19. The opportunity to ask questions was provided.  [name] is having a gout attack, she is currently taking Colchicine as well as Allopurinal.
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
## 2019-10-16 ENCOUNTER — Other Ambulatory Visit: Payer: Self-pay

## 2019-10-16 ENCOUNTER — Ambulatory Visit
Admission: RE | Admit: 2019-10-16 | Discharge: 2019-10-16 | Disposition: A | Discharge status: Home / Self Care | Payer: PPO | Source: Ambulatory Visit | Attending: Surgery | Admitting: Surgery

## 2019-10-16 DIAGNOSIS — C50911 Malignant neoplasm of unspecified site of right female breast: Secondary | ICD-10-CM | POA: Diagnosis not present

## 2019-10-17 ENCOUNTER — Ambulatory Visit (HOSPITAL_COMMUNITY)
Admission: RE | Admit: 2019-10-17 | Discharge: 2019-10-17 | Disposition: A | Discharge status: Home / Self Care | Payer: PPO | Attending: Surgery | Admitting: Surgery

## 2019-10-17 ENCOUNTER — Ambulatory Visit (HOSPITAL_COMMUNITY): Payer: PPO | Admitting: Emergency Medicine

## 2019-10-17 ENCOUNTER — Other Ambulatory Visit: Payer: Self-pay

## 2019-10-17 ENCOUNTER — Encounter (HOSPITAL_COMMUNITY)
Admission: RE | Disposition: A | Discharge status: Home / Self Care | Payer: Self-pay | Source: Home / Self Care | Attending: Surgery

## 2019-10-17 ENCOUNTER — Ambulatory Visit
Admission: RE | Admit: 2019-10-17 | Discharge: 2019-10-17 | Disposition: A | Discharge status: Home / Self Care | Payer: PPO | Source: Ambulatory Visit | Attending: Surgery | Admitting: Surgery

## 2019-10-17 ENCOUNTER — Ambulatory Visit (HOSPITAL_COMMUNITY): Payer: PPO | Admitting: Anesthesiology

## 2019-10-17 ENCOUNTER — Encounter (HOSPITAL_COMMUNITY): Payer: Self-pay | Admitting: Surgery

## 2019-10-17 DIAGNOSIS — R928 Other abnormal and inconclusive findings on diagnostic imaging of breast: Secondary | ICD-10-CM | POA: Diagnosis not present

## 2019-10-17 DIAGNOSIS — E785 Hyperlipidemia, unspecified: Secondary | ICD-10-CM | POA: Diagnosis not present

## 2019-10-17 DIAGNOSIS — Z79899 Other long term (current) drug therapy: Secondary | ICD-10-CM | POA: Diagnosis not present

## 2019-10-17 DIAGNOSIS — I739 Peripheral vascular disease, unspecified: Secondary | ICD-10-CM | POA: Insufficient documentation

## 2019-10-17 DIAGNOSIS — Z951 Presence of aortocoronary bypass graft: Secondary | ICD-10-CM | POA: Insufficient documentation

## 2019-10-17 DIAGNOSIS — Z885 Allergy status to narcotic agent status: Secondary | ICD-10-CM | POA: Insufficient documentation

## 2019-10-17 DIAGNOSIS — Z7901 Long term (current) use of anticoagulants: Secondary | ICD-10-CM | POA: Diagnosis not present

## 2019-10-17 DIAGNOSIS — Z7983 Long term (current) use of bisphosphonates: Secondary | ICD-10-CM | POA: Insufficient documentation

## 2019-10-17 DIAGNOSIS — Z791 Long term (current) use of non-steroidal anti-inflammatories (NSAID): Secondary | ICD-10-CM | POA: Diagnosis not present

## 2019-10-17 DIAGNOSIS — I10 Essential (primary) hypertension: Secondary | ICD-10-CM | POA: Diagnosis not present

## 2019-10-17 DIAGNOSIS — Z888 Allergy status to other drugs, medicaments and biological substances status: Secondary | ICD-10-CM | POA: Insufficient documentation

## 2019-10-17 DIAGNOSIS — I4819 Other persistent atrial fibrillation: Secondary | ICD-10-CM | POA: Diagnosis not present

## 2019-10-17 DIAGNOSIS — I4891 Unspecified atrial fibrillation: Secondary | ICD-10-CM | POA: Diagnosis not present

## 2019-10-17 DIAGNOSIS — I2510 Atherosclerotic heart disease of native coronary artery without angina pectoris: Secondary | ICD-10-CM | POA: Diagnosis not present

## 2019-10-17 DIAGNOSIS — Z79891 Long term (current) use of opiate analgesic: Secondary | ICD-10-CM | POA: Diagnosis not present

## 2019-10-17 DIAGNOSIS — C50411 Malignant neoplasm of upper-outer quadrant of right female breast: Secondary | ICD-10-CM | POA: Insufficient documentation

## 2019-10-17 DIAGNOSIS — M1990 Unspecified osteoarthritis, unspecified site: Secondary | ICD-10-CM | POA: Diagnosis not present

## 2019-10-17 DIAGNOSIS — M109 Gout, unspecified: Secondary | ICD-10-CM | POA: Insufficient documentation

## 2019-10-17 DIAGNOSIS — C50911 Malignant neoplasm of unspecified site of right female breast: Secondary | ICD-10-CM

## 2019-10-17 DIAGNOSIS — Z170 Estrogen receptor positive status [ER+]: Secondary | ICD-10-CM | POA: Diagnosis not present

## 2019-10-17 DIAGNOSIS — Z86718 Personal history of other venous thrombosis and embolism: Secondary | ICD-10-CM | POA: Insufficient documentation

## 2019-10-17 HISTORY — PX: BREAST LUMPECTOMY WITH RADIOACTIVE SEED LOCALIZATION: SHX6424

## 2019-10-17 SURGERY — BREAST LUMPECTOMY WITH RADIOACTIVE SEED LOCALIZATION
Anesthesia: General | Site: Breast | Laterality: Right

## 2019-10-17 MED ORDER — FENTANYL CITRATE (PF) 100 MCG/2ML IJ SOLN: 25.0000 ug | INTRAMUSCULAR | Status: DC | PRN

## 2019-10-17 MED ORDER — FENTANYL CITRATE (PF) 250 MCG/5ML IJ SOLN
INTRAMUSCULAR | Status: DC | PRN
  Administered 2019-10-17: 25 ug via INTRAVENOUS

## 2019-10-17 MED ORDER — GABAPENTIN 300 MG PO CAPS
300.0000 mg | ORAL_CAPSULE | ORAL | Status: AC
  Administered 2019-10-17: 300 mg via ORAL
  Filled 2019-10-17: qty 1

## 2019-10-17 MED ORDER — PROPOFOL 10 MG/ML IV BOLUS
INTRAVENOUS | Status: DC | PRN
  Administered 2019-10-17: 150 mg via INTRAVENOUS

## 2019-10-17 MED ORDER — DEXAMETHASONE SODIUM PHOSPHATE 10 MG/ML IJ SOLN
INTRAMUSCULAR | Status: DC | PRN
  Administered 2019-10-17: 5 mg via INTRAVENOUS

## 2019-10-17 MED ORDER — EPHEDRINE SULFATE-NACL 50-0.9 MG/10ML-% IV SOSY
PREFILLED_SYRINGE | INTRAVENOUS | Status: DC | PRN
  Administered 2019-10-17 (×2): 10 mg via INTRAVENOUS

## 2019-10-17 MED ORDER — ESMOLOL HCL 100 MG/10ML IV SOLN
INTRAVENOUS | Status: AC
  Filled 2019-10-17: qty 10

## 2019-10-17 MED ORDER — ACETAMINOPHEN 500 MG PO TABS
1000.0000 mg | ORAL_TABLET | ORAL | Status: AC
  Administered 2019-10-17: 1000 mg via ORAL
  Filled 2019-10-17: qty 2

## 2019-10-17 MED ORDER — STERILE WATER FOR IRRIGATION IR SOLN
Status: DC | PRN
  Administered 2019-10-17: 1000 mL

## 2019-10-17 MED ORDER — GLYCOPYRROLATE PF 0.2 MG/ML IJ SOSY
PREFILLED_SYRINGE | INTRAMUSCULAR | Status: DC | PRN
  Administered 2019-10-17: .1 mg via INTRAVENOUS

## 2019-10-17 MED ORDER — LIDOCAINE 2% (20 MG/ML) 5 ML SYRINGE
INTRAMUSCULAR | Status: AC
  Filled 2019-10-17: qty 5

## 2019-10-17 MED ORDER — PROPOFOL 10 MG/ML IV BOLUS
INTRAVENOUS | Status: AC
  Filled 2019-10-17: qty 20

## 2019-10-17 MED ORDER — METOPROLOL TARTRATE 5 MG/5ML IV SOLN
INTRAVENOUS | Status: AC
  Filled 2019-10-17: qty 5

## 2019-10-17 MED ORDER — ONDANSETRON HCL 4 MG/2ML IJ SOLN
INTRAMUSCULAR | Status: AC
  Filled 2019-10-17: qty 2

## 2019-10-17 MED ORDER — CEFAZOLIN SODIUM-DEXTROSE 2-4 GM/100ML-% IV SOLN
2.0000 g | INTRAVENOUS | Status: AC
  Administered 2019-10-17: 2 g via INTRAVENOUS
  Filled 2019-10-17: qty 100

## 2019-10-17 MED ORDER — CHLORHEXIDINE GLUCONATE 0.12 % MT SOLN
15.0000 mL | Freq: Once | OROMUCOSAL | Status: AC
  Administered 2019-10-17: 15 mL via OROMUCOSAL
  Filled 2019-10-17: qty 15

## 2019-10-17 MED ORDER — 0.9 % SODIUM CHLORIDE (POUR BTL) OPTIME
TOPICAL | Status: DC | PRN
  Administered 2019-10-17: 1000 mL

## 2019-10-17 MED ORDER — ESMOLOL HCL 100 MG/10ML IV SOLN
INTRAVENOUS | Status: DC | PRN
Start: 2019-10-17 — End: 2019-10-17
  Administered 2019-10-17 (×2): 50 mg via INTRAVENOUS

## 2019-10-17 MED ORDER — MIDAZOLAM HCL 2 MG/2ML IJ SOLN
INTRAMUSCULAR | Status: AC
  Filled 2019-10-17: qty 2

## 2019-10-17 MED ORDER — GLYCOPYRROLATE PF 0.2 MG/ML IJ SOSY
PREFILLED_SYRINGE | INTRAMUSCULAR | Status: AC
  Filled 2019-10-17: qty 1

## 2019-10-17 MED ORDER — METOPROLOL TARTRATE 5 MG/5ML IV SOLN
2.5000 mg | INTRAVENOUS | Status: AC | PRN
  Administered 2019-10-17 (×2): 2.5 mg via INTRAVENOUS

## 2019-10-17 MED ORDER — BUPIVACAINE HCL (PF) 0.25 % IJ SOLN
INTRAMUSCULAR | Status: AC
  Filled 2019-10-17: qty 30

## 2019-10-17 MED ORDER — CHLORHEXIDINE GLUCONATE CLOTH 2 % EX PADS: 6.0000 | MEDICATED_PAD | Freq: Once | CUTANEOUS | Status: DC

## 2019-10-17 MED ORDER — DEXAMETHASONE SODIUM PHOSPHATE 10 MG/ML IJ SOLN
INTRAMUSCULAR | Status: AC
  Filled 2019-10-17: qty 1

## 2019-10-17 MED ORDER — LACTATED RINGERS IV SOLN: INTRAVENOUS | Status: DC | PRN

## 2019-10-17 MED ORDER — FENTANYL CITRATE (PF) 250 MCG/5ML IJ SOLN
INTRAMUSCULAR | Status: AC
  Filled 2019-10-17: qty 5

## 2019-10-17 MED ORDER — BUPIVACAINE HCL (PF) 0.25 % IJ SOLN
INTRAMUSCULAR | Status: DC | PRN
  Administered 2019-10-17: 10 mL

## 2019-10-17 MED ORDER — EPHEDRINE 5 MG/ML INJ
INTRAVENOUS | Status: AC
  Filled 2019-10-17: qty 10

## 2019-10-17 MED ORDER — LIDOCAINE HCL (CARDIAC) PF 100 MG/5ML IV SOSY
PREFILLED_SYRINGE | INTRAVENOUS | Status: DC | PRN
  Administered 2019-10-17: 60 mg via INTRAVENOUS

## 2019-10-17 MED ORDER — ONDANSETRON HCL 4 MG/2ML IJ SOLN
INTRAMUSCULAR | Status: DC | PRN
  Administered 2019-10-17: 4 mg via INTRAVENOUS

## 2019-10-17 MED ORDER — TRAMADOL HCL 50 MG PO TABS: 50.0000 mg | ORAL_TABLET | Freq: Four times a day (QID) | ORAL | 0 refills | Status: DC | PRN

## 2019-10-17 MED ORDER — ONDANSETRON HCL 4 MG/2ML IJ SOLN: 4.0000 mg | Freq: Once | INTRAMUSCULAR | Status: DC | PRN

## 2019-10-17 MED ORDER — MIDAZOLAM HCL 5 MG/5ML IJ SOLN
INTRAMUSCULAR | Status: DC | PRN
  Administered 2019-10-17: 1 mg via INTRAVENOUS

## 2019-10-17 MED ORDER — ORAL CARE MOUTH RINSE: 15.0000 mL | Freq: Once | OROMUCOSAL | Status: AC

## 2019-10-17 SURGICAL SUPPLY — 40 items
APPLIER CLIP 9.375 MED OPEN (MISCELLANEOUS)
BENZOIN TINCTURE PRP APPL 2/3 (GAUZE/BANDAGES/DRESSINGS) ×3 IMPLANT
BINDER BREAST LRG (GAUZE/BANDAGES/DRESSINGS) IMPLANT
BINDER BREAST XLRG (GAUZE/BANDAGES/DRESSINGS) ×3 IMPLANT
CANISTER SUCT 3000ML PPV (MISCELLANEOUS) ×3 IMPLANT
CHLORAPREP W/TINT 26 (MISCELLANEOUS) ×3 IMPLANT
CLIP APPLIE 9.375 MED OPEN (MISCELLANEOUS) IMPLANT
CLOSURE WOUND 1/2 X4 (GAUZE/BANDAGES/DRESSINGS) ×1
COVER PROBE W GEL 5X96 (DRAPES) ×3 IMPLANT
COVER SURGICAL LIGHT HANDLE (MISCELLANEOUS) ×3 IMPLANT
COVER WAND RF STERILE (DRAPES) ×3 IMPLANT
DEVICE DUBIN SPECIMEN MAMMOGRA (MISCELLANEOUS) ×3 IMPLANT
DRAPE CHEST BREAST 15X10 FENES (DRAPES) ×3 IMPLANT
DRSG TEGADERM 4X4.75 (GAUZE/BANDAGES/DRESSINGS) ×3 IMPLANT
ELECT CAUTERY BLADE 6.4 (BLADE) ×3 IMPLANT
ELECT REM PT RETURN 9FT ADLT (ELECTROSURGICAL) ×3
ELECTRODE REM PT RTRN 9FT ADLT (ELECTROSURGICAL) ×1 IMPLANT
GAUZE SPONGE 2X2 8PLY STRL LF (GAUZE/BANDAGES/DRESSINGS) ×1 IMPLANT
GLOVE BIO SURGEON STRL SZ7 (GLOVE) ×3 IMPLANT
GLOVE BIOGEL PI IND STRL 7.5 (GLOVE) ×1 IMPLANT
GLOVE BIOGEL PI INDICATOR 7.5 (GLOVE) ×2
GOWN STRL REUS W/ TWL LRG LVL3 (GOWN DISPOSABLE) ×2 IMPLANT
GOWN STRL REUS W/TWL LRG LVL3 (GOWN DISPOSABLE) ×6
ILLUMINATOR WAVEGUIDE N/F (MISCELLANEOUS) IMPLANT
KIT BASIN OR (CUSTOM PROCEDURE TRAY) ×3 IMPLANT
KIT MARKER MARGIN INK (KITS) ×3 IMPLANT
LIGHT WAVEGUIDE WIDE FLAT (MISCELLANEOUS) IMPLANT
NEEDLE HYPO 25GX1X1/2 BEV (NEEDLE) ×3 IMPLANT
NS IRRIG 1000ML POUR BTL (IV SOLUTION) ×3 IMPLANT
PACK GENERAL/GYN (CUSTOM PROCEDURE TRAY) ×3 IMPLANT
SPONGE GAUZE 2X2 STER 10/PKG (GAUZE/BANDAGES/DRESSINGS) ×2
SPONGE LAP 4X18 RFD (DISPOSABLE) ×3 IMPLANT
STRIP CLOSURE SKIN 1/2X4 (GAUZE/BANDAGES/DRESSINGS) ×2 IMPLANT
SUT MNCRL AB 4-0 PS2 18 (SUTURE) ×3 IMPLANT
SUT SILK 2 0 SH (SUTURE) IMPLANT
SUT VIC AB 3-0 SH 27 (SUTURE) ×3
SUT VIC AB 3-0 SH 27X BRD (SUTURE) ×1 IMPLANT
SYR CONTROL 10ML LL (SYRINGE) ×3 IMPLANT
TOWEL GREEN STERILE (TOWEL DISPOSABLE) ×3 IMPLANT
TOWEL GREEN STERILE FF (TOWEL DISPOSABLE) ×3 IMPLANT

## 2019-10-17 NOTE — Anesthesia Preprocedure Evaluation (Addendum)
Anesthesia Evaluation  Patient identified by MRN, date of birth, ID band Patient awake    Reviewed: Allergy & Precautions, NPO status , Patient's Chart, lab work & pertinent test results  History of Anesthesia Complications (+) PONV and history of anesthetic complications  Airway Mallampati: II  TM Distance: >3 FB Neck ROM: Full    Dental no notable dental hx.    Pulmonary neg pulmonary ROS,    Pulmonary exam normal breath sounds clear to auscultation       Cardiovascular hypertension, Pt. on home beta blockers and Pt. on medications + CAD, + CABG, + Peripheral Vascular Disease and + DVT  Normal cardiovascular exam+ dysrhythmias Atrial Fibrillation + Valvular Problems/Murmurs MR  Rhythm:Regular Rate:Normal  ECG: a-fib, rate 74  ECHO: Normal LV function; calcified aortic valve with no AS by doppler; moderate MR; moderate LAE; moderate TR; mildly elevated pulmonary pressure.    Neuro/Psych negative neurological ROS  negative psych ROS   GI/Hepatic negative GI ROS, Neg liver ROS,   Endo/Other  negative endocrine ROS  Renal/GU Renal disease     Musculoskeletal  (+) Arthritis , Gout   Abdominal (+) + obese,   Peds  Hematology negative hematology ROS (+)   Anesthesia Other Findings RIGHT INVASIVE DUCTAL CARCINOMA  Reproductive/Obstetrics                            Anesthesia Physical Anesthesia Plan  ASA: III  Anesthesia Plan: General   Post-op Pain Management:    Induction: Intravenous  PONV Risk Score and Plan: 4 or greater and Ondansetron, Dexamethasone and Treatment may vary due to age or medical condition  Airway Management Planned: LMA  Additional Equipment:   Intra-op Plan:   Post-operative Plan: Extubation in OR  Informed Consent: I have reviewed the patients History and Physical, chart, labs and discussed the procedure including the risks, benefits and alternatives for  the proposed anesthesia with the patient or authorized representative who has indicated his/her understanding and acceptance.     Dental advisory given  Plan Discussed with: CRNA  Anesthesia Plan Comments:        Anesthesia Quick Evaluation

## 2019-10-17 NOTE — Discharge Instructions (Signed)
[facility] °Office Phone Number [phone] ° °BREAST BIOPSY/ PARTIAL MASTECTOMY: POST OP INSTRUCTIONS ° °Always review your discharge instruction sheet given to you by the facility where your surgery was performed. ° °IF YOU HAVE DISABILITY OR FAMILY LEAVE FORMS, YOU MUST BRING THEM TO THE OFFICE FOR PROCESSING.  DO NOT GIVE THEM TO YOUR DOCTOR. ° °1. A prescription for pain medication may be given to you upon discharge.  Take your pain medication as prescribed, if needed.  If narcotic pain medicine is not needed, then you may take acetaminophen (Tylenol) or ibuprofen (Advil) as needed. °2. Take your usually prescribed medications unless otherwise directed °3. If you need a refill on your pain medication, please contact your pharmacy.  They will contact our office to request authorization.  Prescriptions will not be filled after 5pm or on week-ends. °4. You should eat very light the first 24 hours after surgery, such as soup, crackers, pudding, etc.  Resume your normal diet the day after surgery. °5. Most patients will experience some swelling and bruising in the breast.  Ice packs and a good support bra will help.  Swelling and bruising can take several days to resolve.  °6. It is common to experience some constipation if taking pain medication after surgery.  Increasing fluid intake and taking a stool softener will usually help or prevent this problem from occurring.  A mild laxative (Milk of Magnesia or Miralax) should be taken according to package directions if there are no bowel movements after 48 hours. °7. Unless discharge instructions indicate otherwise, you may remove your bandages 24-48 hours after surgery, and you may shower at that time.  You may have steri-strips (small skin tapes) in place directly over the incision.  These strips should be left on the skin for 7-10 days.  If your surgeon used skin glue on the incision, you may shower in 24 hours.  The glue will flake off over the  next 2-3 weeks.  Any sutures or staples will be removed at the office during your follow-up visit. °8. ACTIVITIES:  You may resume regular daily activities (gradually increasing) beginning the next day.  Wearing a good support bra or sports bra minimizes pain and swelling.  You may have sexual intercourse when it is comfortable. °a. You may drive when you no longer are taking prescription pain medication, you can comfortably wear a seatbelt, and you can safely maneuver your car and apply brakes. °b. RETURN TO WORK:  ______________________________________________________________________________________ °9. You should see your doctor in the office for a follow-up appointment approximately two weeks after your surgery.  Your doctor’s nurse will typically make your follow-up appointment when she calls you with your pathology report.  Expect your pathology report 2-3 business days after your surgery.  You may call to check if you do not hear from us after three days. °10. OTHER INSTRUCTIONS: _______________________________________________________________________________________________ _____________________________________________________________________________________________________________________________________ °_____________________________________________________________________________________________________________________________________ °_____________________________________________________________________________________________________________________________________ ° °WHEN TO CALL YOUR DOCTOR: °1. Fever over 101.0 °2. Nausea and/or vomiting. °3. Extreme swelling or bruising. °4. Continued bleeding from incision. °5. Increased pain, redness, or drainage from the incision. ° °The clinic staff is available to answer your questions during regular business hours.  Please don’t hesitate to call and ask to speak to one of the nurses for clinical concerns.  If you have a medical emergency, go to the nearest  emergency room or call 911.  A surgeon from [facility] is always on call at the hospital. ° °For further questions, please visit [email or web address]  °

## 2019-10-17 NOTE — Anesthesia Procedure Notes (Signed)
Procedure Name: LMA Insertion Performed by: Michele Rockers, CRNA Pre-anesthesia Checklist: Patient identified, Emergency Drugs available, Suction available and Patient being monitored Patient Re-evaluated:Patient Re-evaluated prior to induction Oxygen Delivery Method: Circle system utilized Preoxygenation: Pre-oxygenation with 100% oxygen Induction Type: IV induction Ventilation: Mask ventilation without difficulty LMA Size: 4.0 Number of attempts: 1 Airway Equipment and Method: Oral airway Placement Confirmation: positive ETCO2 and breath sounds checked- equal and bilateral Tube secured with: Tape Dental Injury: Teeth and Oropharynx as per pre-operative assessment

## 2019-10-17 NOTE — Transfer of Care (Signed)
Immediate Anesthesia Transfer of Care Note  Patient: Sandra Bradford  Procedure(s) Performed: RIGHT BREAST LUMPECTOMY WITH RADIOACTIVE SEED LOCALIZATION (Right Breast)  Patient Location: PACU  Anesthesia Type:General  Level of Consciousness: drowsy and patient cooperative  Airway & Oxygen Therapy: Patient Spontanous Breathing and Patient connected to face mask oxygen  Post-op Assessment: Report given to RN and Post -op Vital signs reviewed and stable  Post vital signs: Reviewed and stable  Last Vitals:  Vitals Value Taken Time  BP 136/98 10/17/19 1039  Temp    Pulse 89 10/17/19 1041  Resp 14 10/17/19 1041  SpO2 100 % 10/17/19 1041  Vitals shown include unvalidated device data.  Last Pain:  Vitals:   10/17/19 0642  TempSrc: Temporal         Complications: No complications documented.

## 2019-10-17 NOTE — Interval H&P Note (Signed)
History and Physical Interval Note:  10/17/2019 8:42 AM  Sandra Bradford  has presented today for surgery, with the diagnosis of RIGHT INVASIVE DUCTAL CARCINOMA.  The various methods of treatment have been discussed with the patient and family. After consideration of risks, benefits and other options for treatment, the patient has consented to  Procedure(s): RIGHT BREAST LUMPECTOMY WITH RADIOACTIVE SEED LOCALIZATION (Right) as a surgical intervention.  The patient's history has been reviewed, patient examined, no change in status, stable for surgery.  I have reviewed the patient's chart and labs.  Questions were answered to the patient's satisfaction.     Maia Petties

## 2019-10-17 NOTE — Op Note (Signed)
Pre-op Diagnosis:  Invasive ductal carcinoma right breast Post-op Diagnosis: same Procedure:  Right radioactive seed localized lumpectomy Surgeon:  Rodgers Likes K. Anesthesia:  GEN - LMA Indications:  This is an 79 year old female who was recently diagnosed with a 1.6 cm IDC, ER/PR +.  She has significant cardiac comorbidities.  She will not consider chemotherapy, so we are not going to pursue sentinel lymph node biopsy.  Description of procedure: The patient is brought to the operating room placed in supine position on the operating room table. After an adequate level of general anesthesia was obtained, her right breast was prepped with ChloraPrep and draped in sterile fashion. A timeout was taken to ensure the proper patient and proper procedure. We interrogated the breast with the neoprobe. We made a transvers incision in the lateral side of the nipple after infiltrating with 0.25% Marcaine. Dissection was carried down in the breast tissue with cautery. We used the neoprobe to guide [place] towards the radioactive seed. We excised an area of tissue around the radioactive seed 2.5 cm in diameter. The specimen was removed and was oriented with a paint kit. Specimen mammogram showed the radioactive seed as well as the biopsy clip within the specimen. This was sent for pathologic examination. There is no residual radioactivity within the biopsy cavity. We inspected carefully for hemostasis. The wound was thoroughly irrigated. The wound was closed with a deep layer of 3-0 Vicryl and a subcuticular layer of 4-0 Monocryl. Benzoin Steri-Strips were applied. The patient was then extubated and brought to the recovery room in stable condition. All sponge, instrument, and needle counts are correct.  Imogene Burn. Georgette Dover, MD, [facility] Surgery  General/ Trauma Surgery  10/17/2019 10:23 AM

## 2019-10-17 NOTE — Anesthesia Postprocedure Evaluation (Signed)
Anesthesia Post Note  Patient: Sandra Bradford  Procedure(s) Performed: RIGHT BREAST LUMPECTOMY WITH RADIOACTIVE SEED LOCALIZATION (Right Breast)     Patient location during evaluation: PACU Anesthesia Type: General Level of consciousness: awake Pain management: pain level controlled Vital Signs Assessment: post-procedure vital signs reviewed and stable Respiratory status: spontaneous breathing, nonlabored ventilation, respiratory function stable and patient connected to nasal cannula oxygen Cardiovascular status: blood pressure returned to baseline and stable Postop Assessment: no apparent nausea or vomiting Anesthetic complications: no Comments: Heart rate decreased with beta-blocker administration in the PACU.   No complications documented.  Last Vitals:  Vitals:   10/17/19 1225 10/17/19 1240  BP: (!) 148/77 (!) 142/83  Pulse: 87 87  Resp: 18 15  Temp:  (!) 36.1 C  SpO2: 98% 95%    Last Pain:  Vitals:   10/17/19 1240  TempSrc:   PainSc: 0-No pain                 Asher Torpey P Tonjia Parillo

## 2019-10-18 ENCOUNTER — Encounter (HOSPITAL_COMMUNITY): Payer: Self-pay | Admitting: Surgery

## 2019-10-21 ENCOUNTER — Encounter: Payer: Self-pay | Admitting: *Deleted

## 2019-10-21 LAB — SURGICAL PATHOLOGY

## 2019-10-23 DIAGNOSIS — I129 Hypertensive chronic kidney disease with stage 1 through stage 4 chronic kidney disease, or unspecified chronic kidney disease: Secondary | ICD-10-CM | POA: Diagnosis not present

## 2019-10-23 DIAGNOSIS — I2510 Atherosclerotic heart disease of native coronary artery without angina pectoris: Secondary | ICD-10-CM | POA: Diagnosis not present

## 2019-10-23 DIAGNOSIS — N1831 Chronic kidney disease, stage 3a: Secondary | ICD-10-CM | POA: Diagnosis not present

## 2019-10-23 DIAGNOSIS — I4821 Permanent atrial fibrillation: Secondary | ICD-10-CM | POA: Diagnosis not present

## 2019-10-23 NOTE — Progress Notes (Signed)
Nutrition  Patient identified by attending Breast Clinic on 09/25/19.   Chart reviewed.   Patient with breast cancer.  S/p lumpectomy on 6/24.  Planning radiation and AI.    Ht: 62 inches Wt: 169 lb BMI 31  Patient currently not at nutritional risk.  Patient has received nutrition packet with RD contact information.  RD available as needed.   Paulino Cork B. Zenia Resides, [place], [place] Registered Dietitian [phone] (pager)

## 2019-11-04 DIAGNOSIS — C50911 Malignant neoplasm of unspecified site of right female breast: Secondary | ICD-10-CM | POA: Diagnosis not present

## 2019-11-07 ENCOUNTER — Encounter: Payer: Self-pay | Admitting: Radiation Oncology

## 2019-11-07 ENCOUNTER — Ambulatory Visit: Payer: PPO | Admitting: Radiation Oncology

## 2019-11-07 ENCOUNTER — Ambulatory Visit
Admission: RE | Admit: 2019-11-07 | Discharge: 2019-11-07 | Disposition: A | Discharge status: Home / Self Care | Payer: PPO | Source: Ambulatory Visit | Attending: Radiation Oncology | Admitting: Radiation Oncology

## 2019-11-07 ENCOUNTER — Ambulatory Visit: Payer: PPO

## 2019-11-07 VITALS — Ht 62.0 in

## 2019-11-07 DIAGNOSIS — C50411 Malignant neoplasm of upper-outer quadrant of right female breast: Secondary | ICD-10-CM | POA: Diagnosis not present

## 2019-11-07 DIAGNOSIS — Z170 Estrogen receptor positive status [ER+]: Secondary | ICD-10-CM | POA: Diagnosis not present

## 2019-11-07 DIAGNOSIS — Z9889 Other specified postprocedural states: Secondary | ICD-10-CM | POA: Diagnosis not present

## 2019-11-07 DIAGNOSIS — Z803 Family history of malignant neoplasm of breast: Secondary | ICD-10-CM | POA: Diagnosis not present
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
## 2019-11-12 ENCOUNTER — Ambulatory Visit
Admission: RE | Admit: 2019-11-12 | Discharge: 2019-11-12 | Disposition: A | Discharge status: Home / Self Care | Payer: PPO | Source: Ambulatory Visit | Attending: Radiation Oncology | Admitting: Radiation Oncology

## 2019-11-12 ENCOUNTER — Other Ambulatory Visit: Payer: Self-pay

## 2019-11-12 DIAGNOSIS — Z510 Encounter for antineoplastic radiation therapy: Secondary | ICD-10-CM | POA: Diagnosis not present

## 2019-11-12 DIAGNOSIS — C50411 Malignant neoplasm of upper-outer quadrant of right female breast: Secondary | ICD-10-CM | POA: Insufficient documentation

## 2019-11-13 ENCOUNTER — Encounter: Payer: Self-pay | Admitting: *Deleted

## 2019-11-13 DIAGNOSIS — Z510 Encounter for antineoplastic radiation therapy: Secondary | ICD-10-CM | POA: Diagnosis not present

## 2019-11-13 DIAGNOSIS — C50411 Malignant neoplasm of upper-outer quadrant of right female breast: Secondary | ICD-10-CM | POA: Diagnosis not present

## 2019-11-18 ENCOUNTER — Other Ambulatory Visit: Payer: Self-pay

## 2019-11-18 ENCOUNTER — Ambulatory Visit
Admission: RE | Admit: 2019-11-18 | Discharge: 2019-11-18 | Disposition: A | Discharge status: Home / Self Care | Payer: PPO | Source: Ambulatory Visit | Attending: Radiation Oncology | Admitting: Radiation Oncology

## 2019-11-18 DIAGNOSIS — C50411 Malignant neoplasm of upper-outer quadrant of right female breast: Secondary | ICD-10-CM | POA: Diagnosis not present

## 2019-11-18 DIAGNOSIS — Z510 Encounter for antineoplastic radiation therapy: Secondary | ICD-10-CM | POA: Diagnosis not present

## 2019-11-19 ENCOUNTER — Other Ambulatory Visit: Payer: Self-pay

## 2019-11-19 ENCOUNTER — Ambulatory Visit
Admission: RE | Admit: 2019-11-19 | Discharge: 2019-11-19 | Disposition: A | Discharge status: Home / Self Care | Payer: PPO | Source: Ambulatory Visit | Attending: Radiation Oncology | Admitting: Radiation Oncology

## 2019-11-19 DIAGNOSIS — Z510 Encounter for antineoplastic radiation therapy: Secondary | ICD-10-CM | POA: Diagnosis not present

## 2019-11-19 DIAGNOSIS — C50411 Malignant neoplasm of upper-outer quadrant of right female breast: Secondary | ICD-10-CM | POA: Diagnosis not present

## 2019-11-20 ENCOUNTER — Ambulatory Visit
Admission: RE | Admit: 2019-11-20 | Discharge: 2019-11-20 | Disposition: A | Discharge status: Home / Self Care | Payer: PPO | Source: Ambulatory Visit | Attending: Radiation Oncology | Admitting: Radiation Oncology

## 2019-11-20 ENCOUNTER — Other Ambulatory Visit: Payer: Self-pay

## 2019-11-20 DIAGNOSIS — C50411 Malignant neoplasm of upper-outer quadrant of right female breast: Secondary | ICD-10-CM | POA: Diagnosis not present

## 2019-11-20 DIAGNOSIS — Z510 Encounter for antineoplastic radiation therapy: Secondary | ICD-10-CM | POA: Diagnosis not present

## 2019-11-21 ENCOUNTER — Ambulatory Visit
Admission: RE | Admit: 2019-11-21 | Discharge: 2019-11-21 | Disposition: A | Discharge status: Home / Self Care | Payer: PPO | Source: Ambulatory Visit | Attending: Radiation Oncology | Admitting: Radiation Oncology

## 2019-11-21 ENCOUNTER — Other Ambulatory Visit: Payer: Self-pay

## 2019-11-21 DIAGNOSIS — C50411 Malignant neoplasm of upper-outer quadrant of right female breast: Secondary | ICD-10-CM | POA: Diagnosis not present

## 2019-11-21 DIAGNOSIS — Z510 Encounter for antineoplastic radiation therapy: Secondary | ICD-10-CM | POA: Diagnosis not present

## 2019-11-22 ENCOUNTER — Other Ambulatory Visit: Payer: Self-pay

## 2019-11-22 ENCOUNTER — Ambulatory Visit
Admission: RE | Admit: 2019-11-22 | Discharge: 2019-11-22 | Disposition: A | Discharge status: Home / Self Care | Payer: PPO | Source: Ambulatory Visit | Attending: Radiation Oncology | Admitting: Radiation Oncology

## 2019-11-22 DIAGNOSIS — Z510 Encounter for antineoplastic radiation therapy: Secondary | ICD-10-CM | POA: Diagnosis not present

## 2019-11-22 DIAGNOSIS — I129 Hypertensive chronic kidney disease with stage 1 through stage 4 chronic kidney disease, or unspecified chronic kidney disease: Secondary | ICD-10-CM | POA: Diagnosis not present

## 2019-11-22 DIAGNOSIS — N1831 Chronic kidney disease, stage 3a: Secondary | ICD-10-CM | POA: Diagnosis not present

## 2019-11-22 DIAGNOSIS — C50411 Malignant neoplasm of upper-outer quadrant of right female breast: Secondary | ICD-10-CM | POA: Diagnosis not present

## 2019-11-22 DIAGNOSIS — I4821 Permanent atrial fibrillation: Secondary | ICD-10-CM | POA: Diagnosis not present

## 2019-11-22 DIAGNOSIS — I2510 Atherosclerotic heart disease of native coronary artery without angina pectoris: Secondary | ICD-10-CM | POA: Diagnosis not present

## 2019-11-25 ENCOUNTER — Other Ambulatory Visit: Payer: Self-pay

## 2019-11-25 ENCOUNTER — Ambulatory Visit
Admission: RE | Admit: 2019-11-25 | Discharge: 2019-11-25 | Disposition: A | Discharge status: Home / Self Care | Payer: PPO | Source: Ambulatory Visit | Attending: Radiation Oncology | Admitting: Radiation Oncology

## 2019-11-25 DIAGNOSIS — Z170 Estrogen receptor positive status [ER+]: Secondary | ICD-10-CM | POA: Insufficient documentation

## 2019-11-25 DIAGNOSIS — C50411 Malignant neoplasm of upper-outer quadrant of right female breast: Secondary | ICD-10-CM | POA: Diagnosis not present

## 2019-11-25 DIAGNOSIS — Z510 Encounter for antineoplastic radiation therapy: Secondary | ICD-10-CM | POA: Diagnosis not present

## 2019-11-26 ENCOUNTER — Other Ambulatory Visit: Payer: Self-pay

## 2019-11-26 ENCOUNTER — Ambulatory Visit
Admission: RE | Admit: 2019-11-26 | Discharge: 2019-11-26 | Disposition: A | Discharge status: Home / Self Care | Payer: PPO | Source: Ambulatory Visit | Attending: Radiation Oncology | Admitting: Radiation Oncology

## 2019-11-26 DIAGNOSIS — C50411 Malignant neoplasm of upper-outer quadrant of right female breast: Secondary | ICD-10-CM | POA: Diagnosis not present

## 2019-11-27 ENCOUNTER — Other Ambulatory Visit: Payer: Self-pay

## 2019-11-27 ENCOUNTER — Ambulatory Visit
Admission: RE | Admit: 2019-11-27 | Discharge: 2019-11-27 | Disposition: A | Discharge status: Home / Self Care | Payer: PPO | Source: Ambulatory Visit | Attending: Radiation Oncology | Admitting: Radiation Oncology

## 2019-11-27 DIAGNOSIS — C50411 Malignant neoplasm of upper-outer quadrant of right female breast: Secondary | ICD-10-CM | POA: Diagnosis not present

## 2019-11-28 ENCOUNTER — Ambulatory Visit
Admission: RE | Admit: 2019-11-28 | Discharge: 2019-11-28 | Disposition: A | Discharge status: Home / Self Care | Payer: PPO | Source: Ambulatory Visit | Attending: Radiation Oncology | Admitting: Radiation Oncology

## 2019-11-28 ENCOUNTER — Other Ambulatory Visit: Payer: Self-pay

## 2019-11-28 ENCOUNTER — Telehealth: Payer: Self-pay | Admitting: Hematology

## 2019-11-28 DIAGNOSIS — C50411 Malignant neoplasm of upper-outer quadrant of right female breast: Secondary | ICD-10-CM | POA: Diagnosis not present

## 2019-11-28 NOTE — Telephone Encounter (Signed)
Scheduled appt per 8/5 sch msg - left message with pt for appt date and time

## 2019-11-29 ENCOUNTER — Ambulatory Visit
Admission: RE | Admit: 2019-11-29 | Discharge: 2019-11-29 | Disposition: A | Discharge status: Home / Self Care | Payer: PPO | Source: Ambulatory Visit | Attending: Radiation Oncology | Admitting: Radiation Oncology

## 2019-11-29 ENCOUNTER — Other Ambulatory Visit: Payer: Self-pay

## 2019-11-29 DIAGNOSIS — C50411 Malignant neoplasm of upper-outer quadrant of right female breast: Secondary | ICD-10-CM

## 2019-11-29 MED ORDER — SONAFINE EX EMUL
1.0000 "application " | Freq: Two times a day (BID) | CUTANEOUS | Status: DC
  Administered 2019-11-29: 1 via TOPICAL

## 2019-11-29 MED ORDER — ALRA NON-METALLIC DEODORANT (RAD-ONC)
1.0000 "application " | Freq: Once | TOPICAL | Status: AC
  Administered 2019-11-29: 1 via TOPICAL

## 2019-12-02 ENCOUNTER — Ambulatory Visit
Admission: RE | Admit: 2019-12-02 | Discharge: 2019-12-02 | Disposition: A | Discharge status: Home / Self Care | Payer: PPO | Source: Ambulatory Visit | Attending: Radiation Oncology | Admitting: Radiation Oncology

## 2019-12-02 ENCOUNTER — Other Ambulatory Visit: Payer: Self-pay

## 2019-12-02 DIAGNOSIS — C50411 Malignant neoplasm of upper-outer quadrant of right female breast: Secondary | ICD-10-CM | POA: Diagnosis not present

## 2019-12-03 ENCOUNTER — Other Ambulatory Visit: Payer: Self-pay

## 2019-12-03 ENCOUNTER — Ambulatory Visit
Admission: RE | Admit: 2019-12-03 | Discharge: 2019-12-03 | Disposition: A | Discharge status: Home / Self Care | Payer: PPO | Source: Ambulatory Visit | Attending: Radiation Oncology | Admitting: Radiation Oncology

## 2019-12-03 DIAGNOSIS — C50411 Malignant neoplasm of upper-outer quadrant of right female breast: Secondary | ICD-10-CM | POA: Diagnosis not present

## 2019-12-04 ENCOUNTER — Other Ambulatory Visit: Payer: Self-pay

## 2019-12-04 ENCOUNTER — Ambulatory Visit
Admission: RE | Admit: 2019-12-04 | Discharge: 2019-12-04 | Disposition: A | Discharge status: Home / Self Care | Payer: PPO | Source: Ambulatory Visit | Attending: Radiation Oncology | Admitting: Radiation Oncology

## 2019-12-04 DIAGNOSIS — C50411 Malignant neoplasm of upper-outer quadrant of right female breast: Secondary | ICD-10-CM | POA: Diagnosis not present

## 2019-12-05 ENCOUNTER — Ambulatory Visit
Admission: RE | Admit: 2019-12-05 | Discharge: 2019-12-05 | Disposition: A | Discharge status: Home / Self Care | Payer: PPO | Source: Ambulatory Visit | Attending: Radiation Oncology | Admitting: Radiation Oncology

## 2019-12-05 ENCOUNTER — Other Ambulatory Visit: Payer: Self-pay

## 2019-12-05 DIAGNOSIS — C50411 Malignant neoplasm of upper-outer quadrant of right female breast: Secondary | ICD-10-CM | POA: Diagnosis not present

## 2019-12-06 ENCOUNTER — Ambulatory Visit: Payer: PPO | Admitting: Radiation Oncology

## 2019-12-06 ENCOUNTER — Other Ambulatory Visit: Payer: Self-pay

## 2019-12-06 ENCOUNTER — Ambulatory Visit
Admission: RE | Admit: 2019-12-06 | Discharge: 2019-12-06 | Disposition: A | Discharge status: Home / Self Care | Payer: PPO | Source: Ambulatory Visit | Attending: Radiation Oncology | Admitting: Radiation Oncology

## 2019-12-06 DIAGNOSIS — C50411 Malignant neoplasm of upper-outer quadrant of right female breast: Secondary | ICD-10-CM | POA: Diagnosis not present

## 2019-12-09 ENCOUNTER — Other Ambulatory Visit: Payer: Self-pay

## 2019-12-09 ENCOUNTER — Ambulatory Visit
Admission: RE | Admit: 2019-12-09 | Discharge: 2019-12-09 | Disposition: A | Discharge status: Home / Self Care | Payer: PPO | Source: Ambulatory Visit | Attending: Radiation Oncology | Admitting: Radiation Oncology

## 2019-12-09 DIAGNOSIS — C50411 Malignant neoplasm of upper-outer quadrant of right female breast: Secondary | ICD-10-CM | POA: Diagnosis not present

## 2019-12-10 ENCOUNTER — Ambulatory Visit
Admission: RE | Admit: 2019-12-10 | Discharge: 2019-12-10 | Disposition: A | Discharge status: Home / Self Care | Payer: PPO | Source: Ambulatory Visit | Attending: Radiation Oncology | Admitting: Radiation Oncology

## 2019-12-10 ENCOUNTER — Other Ambulatory Visit: Payer: Self-pay

## 2019-12-10 DIAGNOSIS — C50411 Malignant neoplasm of upper-outer quadrant of right female breast: Secondary | ICD-10-CM | POA: Diagnosis not present

## 2019-12-11 ENCOUNTER — Ambulatory Visit
Admission: RE | Admit: 2019-12-11 | Discharge: 2019-12-11 | Disposition: A | Discharge status: Home / Self Care | Payer: PPO | Source: Ambulatory Visit | Attending: Radiation Oncology | Admitting: Radiation Oncology

## 2019-12-11 ENCOUNTER — Other Ambulatory Visit: Payer: Self-pay

## 2019-12-11 DIAGNOSIS — C50411 Malignant neoplasm of upper-outer quadrant of right female breast: Secondary | ICD-10-CM | POA: Diagnosis not present
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
## 2019-12-12 ENCOUNTER — Ambulatory Visit
Admission: RE | Admit: 2019-12-12 | Discharge: 2019-12-12 | Disposition: A | Discharge status: Home / Self Care | Payer: PPO | Source: Ambulatory Visit | Attending: Radiation Oncology | Admitting: Radiation Oncology

## 2019-12-12 ENCOUNTER — Other Ambulatory Visit: Payer: Self-pay

## 2019-12-12 ENCOUNTER — Telehealth: Payer: Self-pay | Admitting: Hematology

## 2019-12-12 ENCOUNTER — Encounter: Payer: Self-pay | Admitting: Hematology

## 2019-12-12 ENCOUNTER — Encounter: Payer: Self-pay | Admitting: *Deleted

## 2019-12-12 ENCOUNTER — Inpatient Hospital Stay: Payer: PPO | Attending: Hematology | Admitting: Hematology

## 2019-12-12 VITALS — BP 147/87 | HR 96 | Temp 97.8°F | Resp 20 | Ht 62.0 in | Wt 169.9 lb

## 2019-12-12 DIAGNOSIS — Z170 Estrogen receptor positive status [ER+]: Secondary | ICD-10-CM

## 2019-12-12 DIAGNOSIS — I129 Hypertensive chronic kidney disease with stage 1 through stage 4 chronic kidney disease, or unspecified chronic kidney disease: Secondary | ICD-10-CM | POA: Insufficient documentation

## 2019-12-12 DIAGNOSIS — C50411 Malignant neoplasm of upper-outer quadrant of right female breast: Secondary | ICD-10-CM | POA: Diagnosis not present

## 2019-12-12 DIAGNOSIS — Z7901 Long term (current) use of anticoagulants: Secondary | ICD-10-CM | POA: Insufficient documentation

## 2019-12-12 DIAGNOSIS — I4891 Unspecified atrial fibrillation: Secondary | ICD-10-CM | POA: Insufficient documentation

## 2019-12-12 DIAGNOSIS — I2510 Atherosclerotic heart disease of native coronary artery without angina pectoris: Secondary | ICD-10-CM | POA: Insufficient documentation

## 2019-12-12 DIAGNOSIS — N183 Chronic kidney disease, stage 3 unspecified: Secondary | ICD-10-CM | POA: Insufficient documentation

## 2019-12-12 MED ORDER — TAMOXIFEN CITRATE 20 MG PO TABS
20.0000 mg | ORAL_TABLET | Freq: Every day | ORAL | 5 refills | Status: DC
Start: 2019-12-12 — End: 2020-04-21

## 2019-12-12 NOTE — Telephone Encounter (Signed)
Scheduled per 8/19 los. Printed avs and calendar for pt.  

## 2019-12-13 ENCOUNTER — Other Ambulatory Visit: Payer: Self-pay

## 2019-12-13 ENCOUNTER — Encounter: Payer: Self-pay | Admitting: Radiation Oncology

## 2019-12-13 ENCOUNTER — Ambulatory Visit
Admission: RE | Admit: 2019-12-13 | Discharge: 2019-12-13 | Disposition: A | Discharge status: Home / Self Care | Payer: PPO | Source: Ambulatory Visit | Attending: Radiation Oncology | Admitting: Radiation Oncology

## 2019-12-13 DIAGNOSIS — C50411 Malignant neoplasm of upper-outer quadrant of right female breast: Secondary | ICD-10-CM | POA: Diagnosis not present

## 2019-12-24 DIAGNOSIS — I4821 Permanent atrial fibrillation: Secondary | ICD-10-CM | POA: Diagnosis not present

## 2019-12-24 DIAGNOSIS — I2510 Atherosclerotic heart disease of native coronary artery without angina pectoris: Secondary | ICD-10-CM | POA: Diagnosis not present

## 2019-12-24 DIAGNOSIS — I129 Hypertensive chronic kidney disease with stage 1 through stage 4 chronic kidney disease, or unspecified chronic kidney disease: Secondary | ICD-10-CM | POA: Diagnosis not present

## 2019-12-24 DIAGNOSIS — N1831 Chronic kidney disease, stage 3a: Secondary | ICD-10-CM | POA: Diagnosis not present

## 2020-01-03 NOTE — Progress Notes (Signed)
  Radiation Oncology         [phone] ________________________________  Name: Sandra Bradford MRN: [id number]  Date: 12/13/2019  DOB: 05-07-1940  End of Treatment Note  Diagnosis:   right-sided breast cancer     Indication for treatment:  Curative       Radiation treatment dates:   11/18/19 - 12/13/19  Site/dose:   The patient initially received a dose of 42.56 Gy in 16 fractions to the breast using whole-breast tangent fields. This was delivered using a 3-D conformal technique. The patient then received a boost to the seroma. This delivered an additional 8 Gy in 59fractions using a 3 field photon technique due to the depth of the seroma. The total dose was 50.56 Gy.  Narrative: The patient tolerated radiation treatment relatively well.   The patient had some expected skin irritation as she progressed during treatment.    Plan: The patient has completed radiation treatment. The patient will return to radiation oncology clinic for routine followup in one month. I advised the patient to call or return sooner if they have any questions or concerns related to their recovery or treatment. ________________________________  Jodelle Gross, M.D., Ph.D.

## 2020-01-06 ENCOUNTER — Telehealth: Payer: Self-pay | Admitting: Radiation Oncology

## 2020-01-06 NOTE — Telephone Encounter (Addendum)
  Radiation Oncology         [phone] ________________________________  Name: Carmen Tolliver MRN: [id number]  Date of Service: 01/06/2020  DOB: 10/28/1940  Post Treatment Telephone Note  Diagnosis:  Stage IA, pT1bNc0M0 grade 2, ER/PR positive invasive ductal carcinoma of the right breast.  Interval Since Last Radiation:  4 weeks   11/18/19 - 12/13/19: The patient initially received a dose of 42.56 Gy in 16 fractions to the breast using whole-breast tangent fields. This was delivered using a 3-D conformal technique. The patient then received a boost to the seroma. This delivered an additional 8 Gy in 61fractions using a 3 field photon technique due to the depth of the seroma. The total dose was 50.56 Gy.  Narrative:  The patient was contacted today for routine follow-up. During treatment she did very well with radiotherapy and did not have significant desquamation.    Impression/Plan: 1. Stage IA, pT1bNc0M0 grade 2, ER/PR positive invasive ductal carcinoma of the right breast. I was unable to reach the patient but left a voicemail and on it, I discussed that we would be happy to continue to follow her as needed, but she will also continue to follow up with Dr. Burr Medico in medical oncology. She was counseled on skin care as well as measures to avoid sun exposure to this area.      Carola Rhine, PAC

## 2020-01-16 ENCOUNTER — Other Ambulatory Visit: Payer: Self-pay | Admitting: Cardiovascular Disease

## 2020-01-16 MED ORDER — RIVAROXABAN 15 MG PO TABS: 15.0000 mg | ORAL_TABLET | Freq: Every day | ORAL | 1 refills | Status: DC

## 2020-01-16 NOTE — Telephone Encounter (Signed)
*  STAT* If patient is at the pharmacy, call can be transferred to refill team.   1. Which medications need to be refilled? (please list name of each medication and dose if known)  Rivaroxaban (XARELTO) 15 MG TABS tablet  2. Which pharmacy/location (including street and city if local pharmacy) is medication to be sent to? [facility] - [place], [place] - [place] [address]  3. Do they need a 30 day or 90 day supply? [place]

## 2020-01-17 DIAGNOSIS — I129 Hypertensive chronic kidney disease with stage 1 through stage 4 chronic kidney disease, or unspecified chronic kidney disease: Secondary | ICD-10-CM | POA: Diagnosis not present

## 2020-01-17 DIAGNOSIS — N183 Chronic kidney disease, stage 3 unspecified: Secondary | ICD-10-CM | POA: Diagnosis not present

## 2020-01-17 DIAGNOSIS — Z6835 Body mass index (BMI) 35.0-35.9, adult: Secondary | ICD-10-CM | POA: Diagnosis not present

## 2020-01-17 DIAGNOSIS — M109 Gout, unspecified: Secondary | ICD-10-CM | POA: Diagnosis not present

## 2020-01-23 DIAGNOSIS — I4821 Permanent atrial fibrillation: Secondary | ICD-10-CM | POA: Diagnosis not present

## 2020-01-23 DIAGNOSIS — I2510 Atherosclerotic heart disease of native coronary artery without angina pectoris: Secondary | ICD-10-CM | POA: Diagnosis not present

## 2020-01-23 DIAGNOSIS — I129 Hypertensive chronic kidney disease with stage 1 through stage 4 chronic kidney disease, or unspecified chronic kidney disease: Secondary | ICD-10-CM | POA: Diagnosis not present

## 2020-01-23 DIAGNOSIS — N1831 Chronic kidney disease, stage 3a: Secondary | ICD-10-CM | POA: Diagnosis not present

## 2020-02-17 ENCOUNTER — Telehealth: Payer: Self-pay | Admitting: *Deleted

## 2020-02-17 MED ORDER — RIVAROXABAN 15 MG PO TABS: 15.0000 mg | ORAL_TABLET | Freq: Every day | ORAL | 3 refills | Status: DC

## 2020-02-17 NOTE — Telephone Encounter (Signed)
Xarelto assistance, provider portion, has been faxed. The patient portion was faxed by the patient's PCP [facility] Physician).

## 2020-02-22 DIAGNOSIS — I4821 Permanent atrial fibrillation: Secondary | ICD-10-CM | POA: Diagnosis not present

## 2020-02-22 DIAGNOSIS — I129 Hypertensive chronic kidney disease with stage 1 through stage 4 chronic kidney disease, or unspecified chronic kidney disease: Secondary | ICD-10-CM | POA: Diagnosis not present

## 2020-02-22 DIAGNOSIS — N1831 Chronic kidney disease, stage 3a: Secondary | ICD-10-CM | POA: Diagnosis not present

## 2020-02-22 DIAGNOSIS — I2510 Atherosclerotic heart disease of native coronary artery without angina pectoris: Secondary | ICD-10-CM | POA: Diagnosis not present

## 2020-03-11 ENCOUNTER — Other Ambulatory Visit: Payer: Self-pay | Admitting: Hematology

## 2020-03-11 DIAGNOSIS — Z170 Estrogen receptor positive status [ER+]: Secondary | ICD-10-CM

## 2020-03-12 ENCOUNTER — Inpatient Hospital Stay: Payer: PPO | Attending: Hematology | Admitting: Nurse Practitioner

## 2020-03-12 ENCOUNTER — Other Ambulatory Visit: Payer: Self-pay

## 2020-03-12 ENCOUNTER — Encounter: Payer: Self-pay | Admitting: Nurse Practitioner

## 2020-03-12 ENCOUNTER — Inpatient Hospital Stay: Payer: PPO

## 2020-03-12 VITALS — BP 136/63 | HR 89 | Temp 98.1°F | Resp 18 | Ht 62.0 in | Wt 160.4 lb

## 2020-03-12 DIAGNOSIS — I129 Hypertensive chronic kidney disease with stage 1 through stage 4 chronic kidney disease, or unspecified chronic kidney disease: Secondary | ICD-10-CM | POA: Insufficient documentation

## 2020-03-12 DIAGNOSIS — C50411 Malignant neoplasm of upper-outer quadrant of right female breast: Secondary | ICD-10-CM | POA: Insufficient documentation

## 2020-03-12 DIAGNOSIS — N6459 Other signs and symptoms in breast: Secondary | ICD-10-CM

## 2020-03-12 DIAGNOSIS — N183 Chronic kidney disease, stage 3 unspecified: Secondary | ICD-10-CM | POA: Diagnosis not present

## 2020-03-12 DIAGNOSIS — Z170 Estrogen receptor positive status [ER+]: Secondary | ICD-10-CM

## 2020-03-12 DIAGNOSIS — Z79810 Long term (current) use of selective estrogen receptor modulators (SERMs): Secondary | ICD-10-CM | POA: Insufficient documentation

## 2020-03-12 LAB — CBC WITH DIFFERENTIAL (CANCER CENTER ONLY)
Abs Immature Granulocytes: 0.02 10*3/uL (ref 0.00–0.07)
Basophils Absolute: 0.1 10*3/uL (ref 0.0–0.1)
Basophils Relative: 1 %
Eosinophils Absolute: 0.1 10*3/uL (ref 0.0–0.5)
Eosinophils Relative: 2 %
HCT: 38.9 % (ref 36.0–46.0)
Hemoglobin: 12.5 g/dL (ref 12.0–15.0)
Immature Granulocytes: 0 %
Lymphocytes Relative: 23 %
Lymphs Abs: 1.4 10*3/uL (ref 0.7–4.0)
MCH: 30 pg (ref 26.0–34.0)
MCHC: 32.1 g/dL (ref 30.0–36.0)
MCV: 93.3 fL (ref 80.0–100.0)
Monocytes Absolute: 0.5 10*3/uL (ref 0.1–1.0)
Monocytes Relative: 8 %
Neutro Abs: 4.1 10*3/uL (ref 1.7–7.7)
Neutrophils Relative %: 66 %
Platelet Count: 176 10*3/uL (ref 150–400)
RBC: 4.17 MIL/uL (ref 3.87–5.11)
RDW: 14.1 % (ref 11.5–15.5)
WBC Count: 6.3 10*3/uL (ref 4.0–10.5)
nRBC: 0 % (ref 0.0–0.2)

## 2020-03-12 LAB — CMP (CANCER CENTER ONLY)
ALT: 16 U/L (ref 0–44)
AST: 31 U/L (ref 15–41)
Albumin: 3.6 g/dL (ref 3.5–5.0)
Alkaline Phosphatase: 66 U/L (ref 38–126)
Anion gap: 8 (ref 5–15)
BUN: 22 mg/dL (ref 8–23)
CO2: 29 mmol/L (ref 22–32)
Calcium: 9.5 mg/dL (ref 8.9–10.3)
Chloride: 105 mmol/L (ref 98–111)
Creatinine: 1.28 mg/dL — ABNORMAL HIGH (ref 0.44–1.00)
GFR, Estimated: 42 mL/min — ABNORMAL LOW (ref >60)
Glucose, Bld: 96 mg/dL (ref 70–99)
Potassium: 4.4 mmol/L (ref 3.5–5.1)
Sodium: 142 mmol/L (ref 135–145)
Total Bilirubin: 0.9 mg/dL (ref 0.3–1.2)
Total Protein: 7.6 g/dL (ref 6.5–8.1)
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
## 2020-03-13 ENCOUNTER — Telehealth: Payer: Self-pay | Admitting: Nurse Practitioner

## 2020-03-13 NOTE — Telephone Encounter (Signed)
Scheduled per 11/18 los. Pt is aware of appt times and dates. 

## 2020-03-24 DIAGNOSIS — I129 Hypertensive chronic kidney disease with stage 1 through stage 4 chronic kidney disease, or unspecified chronic kidney disease: Secondary | ICD-10-CM | POA: Diagnosis not present

## 2020-03-24 DIAGNOSIS — N1831 Chronic kidney disease, stage 3a: Secondary | ICD-10-CM | POA: Diagnosis not present

## 2020-03-24 DIAGNOSIS — I2510 Atherosclerotic heart disease of native coronary artery without angina pectoris: Secondary | ICD-10-CM | POA: Diagnosis not present

## 2020-03-24 DIAGNOSIS — I4821 Permanent atrial fibrillation: Secondary | ICD-10-CM | POA: Diagnosis not present

## 2020-04-01 DIAGNOSIS — Z23 Encounter for immunization: Secondary | ICD-10-CM | POA: Diagnosis not present

## 2020-04-20 ENCOUNTER — Other Ambulatory Visit: Payer: Self-pay

## 2020-04-20 ENCOUNTER — Ambulatory Visit: Payer: PPO | Admitting: Cardiovascular Disease

## 2020-04-20 VITALS — BP 121/50 | HR 82 | Ht 61.5 in | Wt 160.6 lb

## 2020-04-20 DIAGNOSIS — N1832 Chronic kidney disease, stage 3b: Secondary | ICD-10-CM | POA: Diagnosis not present

## 2020-04-20 DIAGNOSIS — E663 Overweight: Secondary | ICD-10-CM | POA: Diagnosis not present

## 2020-04-20 DIAGNOSIS — I051 Rheumatic mitral insufficiency: Secondary | ICD-10-CM | POA: Diagnosis not present

## 2020-04-20 DIAGNOSIS — Z7901 Long term (current) use of anticoagulants: Secondary | ICD-10-CM | POA: Diagnosis not present

## 2020-04-20 DIAGNOSIS — I10 Essential (primary) hypertension: Secondary | ICD-10-CM

## 2020-04-20 DIAGNOSIS — E7800 Pure hypercholesterolemia, unspecified: Secondary | ICD-10-CM | POA: Diagnosis not present

## 2020-04-20 DIAGNOSIS — I25708 Atherosclerosis of coronary artery bypass graft(s), unspecified, with other forms of angina pectoris: Secondary | ICD-10-CM

## 2020-04-20 DIAGNOSIS — R7303 Prediabetes: Secondary | ICD-10-CM

## 2020-04-20 DIAGNOSIS — I4821 Permanent atrial fibrillation: Secondary | ICD-10-CM | POA: Diagnosis not present

## 2020-04-20 NOTE — Patient Instructions (Signed)
Medication Instructions:  No changes *If you need a refill on your cardiac medications before your next appointment, please call your pharmacy*   Lab Work: Your provider would like for you to have the following labs: fasting Lipid  If you have labs (blood work) drawn today and your tests are completely normal, you will receive your results only by:  [place] (if you have MyChart) OR  A paper copy in the mail If you have any lab test that is abnormal or we need to change your treatment, we will call you to review the results.   Testing/Procedures: None ordered   Follow-Up: At [facility], you and your health needs are our priority.  As part of our continuing mission to provide you with exceptional heart care, we have created designated Provider Care Teams.  These Care Teams include your primary Cardiologist (physician) and Advanced Practice Providers (APPs -  Physician Assistants and Nurse Practitioners) who all work together to provide you with the care you need, when you need it.  We recommend signing up for the patient portal called "MyChart".  Sign up information is provided on this After Visit Summary.  MyChart is used to connect with patients for Virtual Visits (Telemedicine).  Patients are able to view lab/test results, encounter notes, upcoming appointments, etc.  Non-urgent messages can be sent to your provider as well.   To learn more about what you can do with MyChart, go to [email or web address].    Your next appointment:   12 month(s)  The format for your next appointment:   In Person  Provider:   You may see Sanda Klein, MD or one of the following Advanced Practice Providers on your designated Care Team:    Almyra Deforest, PA-C  Fabian Sharp, PA-C or   Roby Lofts, [place]
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
## 2020-04-21 ENCOUNTER — Other Ambulatory Visit: Payer: Self-pay

## 2020-04-21 ENCOUNTER — Encounter: Payer: Self-pay | Admitting: Cardiovascular Disease

## 2020-04-21 DIAGNOSIS — C50411 Malignant neoplasm of upper-outer quadrant of right female breast: Secondary | ICD-10-CM

## 2020-04-21 DIAGNOSIS — E663 Overweight: Secondary | ICD-10-CM | POA: Insufficient documentation

## 2020-04-21 MED ORDER — TAMOXIFEN CITRATE 20 MG PO TABS: 20.0000 mg | ORAL_TABLET | Freq: Every day | ORAL | 5 refills | Status: DC

## 2020-04-24 DIAGNOSIS — N1831 Chronic kidney disease, stage 3a: Secondary | ICD-10-CM | POA: Diagnosis not present

## 2020-04-24 DIAGNOSIS — I4821 Permanent atrial fibrillation: Secondary | ICD-10-CM | POA: Diagnosis not present

## 2020-04-24 DIAGNOSIS — I129 Hypertensive chronic kidney disease with stage 1 through stage 4 chronic kidney disease, or unspecified chronic kidney disease: Secondary | ICD-10-CM | POA: Diagnosis not present

## 2020-04-24 DIAGNOSIS — I2510 Atherosclerotic heart disease of native coronary artery without angina pectoris: Secondary | ICD-10-CM | POA: Diagnosis not present

## 2020-04-29 ENCOUNTER — Ambulatory Visit
Admission: RE | Admit: 2020-04-29 | Discharge: 2020-04-29 | Disposition: A | Discharge status: Home / Self Care | Payer: PPO | Source: Ambulatory Visit | Attending: Nurse Practitioner | Admitting: Nurse Practitioner

## 2020-04-29 ENCOUNTER — Other Ambulatory Visit: Payer: PPO

## 2020-04-29 ENCOUNTER — Ambulatory Visit: Payer: PPO

## 2020-04-29 ENCOUNTER — Inpatient Hospital Stay: Admission: RE | Admit: 2020-04-29 | Payer: PPO | Source: Ambulatory Visit

## 2020-04-29 ENCOUNTER — Other Ambulatory Visit: Payer: Self-pay

## 2020-04-29 DIAGNOSIS — N6459 Other signs and symptoms in breast: Secondary | ICD-10-CM

## 2020-04-29 DIAGNOSIS — R922 Inconclusive mammogram: Secondary | ICD-10-CM | POA: Diagnosis not present

## 2020-05-25 DIAGNOSIS — I2510 Atherosclerotic heart disease of native coronary artery without angina pectoris: Secondary | ICD-10-CM | POA: Diagnosis not present

## 2020-05-25 DIAGNOSIS — N1831 Chronic kidney disease, stage 3a: Secondary | ICD-10-CM | POA: Diagnosis not present

## 2020-05-25 DIAGNOSIS — I129 Hypertensive chronic kidney disease with stage 1 through stage 4 chronic kidney disease, or unspecified chronic kidney disease: Secondary | ICD-10-CM | POA: Diagnosis not present

## 2020-05-25 DIAGNOSIS — I4821 Permanent atrial fibrillation: Secondary | ICD-10-CM | POA: Diagnosis not present

## 2020-06-03 ENCOUNTER — Telehealth: Payer: Self-pay | Admitting: Cardiovascular Disease

## 2020-06-03 ENCOUNTER — Other Ambulatory Visit: Payer: Self-pay

## 2020-06-03 MED ORDER — QUINAPRIL HCL 10 MG PO TABS: 10.0000 mg | ORAL_TABLET | Freq: Every day | ORAL | 3 refills | Status: DC

## 2020-06-03 MED ORDER — REPATHA SURECLICK 140 MG/ML ~~LOC~~ SOAJ: 140.0000 mg | SUBCUTANEOUS | 11 refills | Status: DC

## 2020-06-03 MED ORDER — METOPROLOL TARTRATE 50 MG PO TABS: 50.0000 mg | ORAL_TABLET | Freq: Two times a day (BID) | ORAL | 3 refills | Status: DC

## 2020-06-03 NOTE — Telephone Encounter (Signed)
°*  STAT* If patient is at the pharmacy, call can be transferred to refill team.   1. Which medications need to be refilled? (please list name of each medication and dose if known)  quinapril (ACCUPRIL) 10 MG tablet furosemide (LASIX) 20 MG tablet    2. Which pharmacy/location (including street and city if local pharmacy) is medication to be sent to? [facility] - [place], [place] - [place] [address]  3. Do they need a 30 day or 90 day supply? 90   Patient is almost out of this medication. Please advise.

## 2020-06-12 ENCOUNTER — Ambulatory Visit: Payer: PPO | Admitting: Hematology

## 2020-06-12 ENCOUNTER — Other Ambulatory Visit: Payer: PPO

## 2020-06-22 DIAGNOSIS — I129 Hypertensive chronic kidney disease with stage 1 through stage 4 chronic kidney disease, or unspecified chronic kidney disease: Secondary | ICD-10-CM | POA: Diagnosis not present

## 2020-06-22 DIAGNOSIS — I2510 Atherosclerotic heart disease of native coronary artery without angina pectoris: Secondary | ICD-10-CM | POA: Diagnosis not present

## 2020-06-22 DIAGNOSIS — N1831 Chronic kidney disease, stage 3a: Secondary | ICD-10-CM | POA: Diagnosis not present

## 2020-06-22 DIAGNOSIS — I4821 Permanent atrial fibrillation: Secondary | ICD-10-CM | POA: Diagnosis not present

## 2020-06-23 ENCOUNTER — Other Ambulatory Visit: Payer: Self-pay | Admitting: Cardiovascular Disease

## 2020-06-24 ENCOUNTER — Other Ambulatory Visit: Payer: Self-pay | Admitting: Cardiovascular Disease

## 2020-06-24 NOTE — Telephone Encounter (Signed)
*  STAT* If patient is at the pharmacy, call can be transferred to refill team.   1. Which medications need to be refilled? (please list name of each medication and dose if known) Furosemide etoprolol,Quinapril and Xarelto 2. Which pharmacy/location (including street and city if local pharmacy) is medication to be sent to? [facility]. Do they need a 30 day or 90 day supply? 90 days and refills
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
## 2020-07-10 ENCOUNTER — Encounter: Payer: Self-pay | Admitting: Hematology

## 2020-07-10 ENCOUNTER — Inpatient Hospital Stay (HOSPITAL_BASED_OUTPATIENT_CLINIC_OR_DEPARTMENT_OTHER): Payer: PPO | Admitting: Hematology

## 2020-07-10 ENCOUNTER — Other Ambulatory Visit: Payer: Self-pay

## 2020-07-10 ENCOUNTER — Telehealth: Payer: Self-pay | Admitting: Hematology

## 2020-07-10 ENCOUNTER — Inpatient Hospital Stay: Payer: PPO | Attending: Hematology

## 2020-07-10 VITALS — BP 124/66 | HR 75 | Temp 98.0°F | Resp 18 | Ht 61.5 in | Wt 158.4 lb

## 2020-07-10 DIAGNOSIS — C50411 Malignant neoplasm of upper-outer quadrant of right female breast: Secondary | ICD-10-CM

## 2020-07-10 DIAGNOSIS — Z170 Estrogen receptor positive status [ER+]: Secondary | ICD-10-CM | POA: Diagnosis not present

## 2020-07-10 DIAGNOSIS — I10 Essential (primary) hypertension: Secondary | ICD-10-CM | POA: Insufficient documentation

## 2020-07-10 DIAGNOSIS — N183 Chronic kidney disease, stage 3 unspecified: Secondary | ICD-10-CM | POA: Insufficient documentation

## 2020-07-10 DIAGNOSIS — Z7901 Long term (current) use of anticoagulants: Secondary | ICD-10-CM | POA: Insufficient documentation

## 2020-07-10 DIAGNOSIS — Z79810 Long term (current) use of selective estrogen receptor modulators (SERMs): Secondary | ICD-10-CM | POA: Diagnosis not present

## 2020-07-10 DIAGNOSIS — Z79899 Other long term (current) drug therapy: Secondary | ICD-10-CM | POA: Insufficient documentation

## 2020-07-10 DIAGNOSIS — Z86718 Personal history of other venous thrombosis and embolism: Secondary | ICD-10-CM | POA: Diagnosis not present

## 2020-07-10 DIAGNOSIS — I4891 Unspecified atrial fibrillation: Secondary | ICD-10-CM | POA: Diagnosis not present

## 2020-07-10 DIAGNOSIS — D649 Anemia, unspecified: Secondary | ICD-10-CM | POA: Diagnosis not present

## 2020-07-10 LAB — CMP (CANCER CENTER ONLY)
ALT: 23 U/L (ref 0–44)
AST: 30 U/L (ref 15–41)
Albumin: 3.4 g/dL — ABNORMAL LOW (ref 3.5–5.0)
Alkaline Phosphatase: 62 U/L (ref 38–126)
Anion gap: 5 (ref 5–15)
BUN: 26 mg/dL — ABNORMAL HIGH (ref 8–23)
CO2: 30 mmol/L (ref 22–32)
Calcium: 9.1 mg/dL (ref 8.9–10.3)
Chloride: 104 mmol/L (ref 98–111)
Creatinine: 1.25 mg/dL — ABNORMAL HIGH (ref 0.44–1.00)
GFR, Estimated: 43 mL/min — ABNORMAL LOW (ref >60)
Glucose, Bld: 93 mg/dL (ref 70–99)
Potassium: 4.3 mmol/L (ref 3.5–5.1)
Sodium: 139 mmol/L (ref 135–145)
Total Bilirubin: 0.5 mg/dL (ref 0.3–1.2)
Total Protein: 6.8 g/dL (ref 6.5–8.1)

## 2020-07-10 LAB — CBC WITH DIFFERENTIAL (CANCER CENTER ONLY)
Abs Immature Granulocytes: 0.02 10*3/uL (ref 0.00–0.07)
Basophils Absolute: 0.1 10*3/uL (ref 0.0–0.1)
Basophils Relative: 1 %
Eosinophils Absolute: 0.2 10*3/uL (ref 0.0–0.5)
Eosinophils Relative: 3 %
HCT: 35.3 % — ABNORMAL LOW (ref 36.0–46.0)
Hemoglobin: 11.4 g/dL — ABNORMAL LOW (ref 12.0–15.0)
Immature Granulocytes: 0 %
Lymphocytes Relative: 21 %
Lymphs Abs: 1.5 10*3/uL (ref 0.7–4.0)
MCH: 31.1 pg (ref 26.0–34.0)
MCHC: 32.3 g/dL (ref 30.0–36.0)
MCV: 96.2 fL (ref 80.0–100.0)
Monocytes Absolute: 0.8 10*3/uL (ref 0.1–1.0)
Monocytes Relative: 11 %
Neutro Abs: 4.7 10*3/uL (ref 1.7–7.7)
Neutrophils Relative %: 64 %
Platelet Count: 155 10*3/uL (ref 150–400)
RBC: 3.67 MIL/uL — ABNORMAL LOW (ref 3.87–5.11)
RDW: 14 % (ref 11.5–15.5)
WBC Count: 7.2 10*3/uL (ref 4.0–10.5)
nRBC: 0 % (ref 0.0–0.2)

## 2020-07-10 NOTE — Telephone Encounter (Signed)
Scheduled follow-up appointment per 3/16 los. Patient is aware. Sent request to schedule labs at [facility] in 2 months to [facility] schedulers.

## 2020-07-16 ENCOUNTER — Other Ambulatory Visit: Payer: Self-pay | Admitting: Cardiovascular Disease

## 2020-07-17 DIAGNOSIS — E7849 Other hyperlipidemia: Secondary | ICD-10-CM | POA: Diagnosis not present

## 2020-07-17 DIAGNOSIS — E7800 Pure hypercholesterolemia, unspecified: Secondary | ICD-10-CM | POA: Diagnosis not present

## 2020-07-17 DIAGNOSIS — I10 Essential (primary) hypertension: Secondary | ICD-10-CM | POA: Diagnosis not present

## 2020-07-18 LAB — LIPID PANEL
Chol/HDL Ratio: 2.3 ratio (ref 0.0–4.4)
Cholesterol, Total: 138 mg/dL (ref 100–199)
HDL: 60 mg/dL (ref >39)
LDL Chol Calc (NIH): 58 mg/dL (ref 0–99)
Triglycerides: 112 mg/dL (ref 0–149)
VLDL Cholesterol Cal: 20 mg/dL (ref 5–40)

## 2020-07-20 ENCOUNTER — Other Ambulatory Visit: Payer: Self-pay | Admitting: Cardiovascular Disease

## 2020-07-20 ENCOUNTER — Encounter: Payer: Self-pay | Admitting: *Deleted

## 2020-07-20 NOTE — Telephone Encounter (Signed)
2f, 71.8kg, scr 1.25(07/10/20), ccr 40, lovw/croitoru(04/20/20)

## 2020-07-22 DIAGNOSIS — N1831 Chronic kidney disease, stage 3a: Secondary | ICD-10-CM | POA: Diagnosis not present

## 2020-07-22 DIAGNOSIS — I129 Hypertensive chronic kidney disease with stage 1 through stage 4 chronic kidney disease, or unspecified chronic kidney disease: Secondary | ICD-10-CM | POA: Diagnosis not present

## 2020-09-09 ENCOUNTER — Other Ambulatory Visit (HOSPITAL_COMMUNITY): Payer: Self-pay

## 2020-09-09 DIAGNOSIS — Z170 Estrogen receptor positive status [ER+]: Secondary | ICD-10-CM

## 2020-09-09 DIAGNOSIS — D649 Anemia, unspecified: Secondary | ICD-10-CM

## 2020-09-10 ENCOUNTER — Inpatient Hospital Stay (HOSPITAL_COMMUNITY): Payer: PPO

## 2020-09-10 ENCOUNTER — Inpatient Hospital Stay (HOSPITAL_COMMUNITY): Payer: PPO | Attending: Hematology

## 2020-09-10 ENCOUNTER — Other Ambulatory Visit: Payer: Self-pay

## 2020-09-10 DIAGNOSIS — N183 Chronic kidney disease, stage 3 unspecified: Secondary | ICD-10-CM | POA: Insufficient documentation

## 2020-09-10 DIAGNOSIS — D649 Anemia, unspecified: Secondary | ICD-10-CM | POA: Diagnosis not present

## 2020-09-10 DIAGNOSIS — C50411 Malignant neoplasm of upper-outer quadrant of right female breast: Secondary | ICD-10-CM | POA: Diagnosis not present

## 2020-09-10 DIAGNOSIS — Z170 Estrogen receptor positive status [ER+]: Secondary | ICD-10-CM

## 2020-09-10 LAB — CBC WITH DIFFERENTIAL/PLATELET
Abs Immature Granulocytes: 0.01 10*3/uL (ref 0.00–0.07)
Basophils Absolute: 0 10*3/uL (ref 0.0–0.1)
Basophils Relative: 1 %
Eosinophils Absolute: 0.2 10*3/uL (ref 0.0–0.5)
Eosinophils Relative: 4 %
HCT: 36.9 % (ref 36.0–46.0)
Hemoglobin: 12 g/dL (ref 12.0–15.0)
Immature Granulocytes: 0 %
Lymphocytes Relative: 24 %
Lymphs Abs: 1.3 10*3/uL (ref 0.7–4.0)
MCH: 32.4 pg (ref 26.0–34.0)
MCHC: 32.5 g/dL (ref 30.0–36.0)
MCV: 99.7 fL (ref 80.0–100.0)
Monocytes Absolute: 0.5 10*3/uL (ref 0.1–1.0)
Monocytes Relative: 10 %
Neutro Abs: 3.3 10*3/uL (ref 1.7–7.7)
Neutrophils Relative %: 61 %
Platelets: 166 10*3/uL (ref 150–400)
RBC: 3.7 MIL/uL — ABNORMAL LOW (ref 3.87–5.11)
RDW: 14.3 % (ref 11.5–15.5)
WBC: 5.3 10*3/uL (ref 4.0–10.5)
nRBC: 0 % (ref 0.0–0.2)

## 2020-09-10 LAB — IRON AND TIBC
Iron: 111 ug/dL (ref 28–170)
Saturation Ratios: 34 % — ABNORMAL HIGH (ref 10.4–31.8)
TIBC: 323 ug/dL (ref 250–450)
UIBC: 212 ug/dL

## 2020-09-10 LAB — RETICULOCYTES
Immature Retic Fract: 8.7 % (ref 2.3–15.9)
RBC.: 3.79 MIL/uL — ABNORMAL LOW (ref 3.87–5.11)
Retic Count, Absolute: 36 10*3/uL (ref 19.0–186.0)
Retic Ct Pct: 1 % (ref 0.4–3.1)

## 2020-09-10 LAB — COMPREHENSIVE METABOLIC PANEL
ALT: 20 U/L (ref 0–44)
AST: 30 U/L (ref 15–41)
Albumin: 3.5 g/dL (ref 3.5–5.0)
Alkaline Phosphatase: 52 U/L (ref 38–126)
Anion gap: 8 (ref 5–15)
BUN: 26 mg/dL — ABNORMAL HIGH (ref 8–23)
CO2: 29 mmol/L (ref 22–32)
Calcium: 9.1 mg/dL (ref 8.9–10.3)
Chloride: 102 mmol/L (ref 98–111)
Creatinine, Ser: 1.35 mg/dL — ABNORMAL HIGH (ref 0.44–1.00)
GFR, Estimated: 39 mL/min — ABNORMAL LOW (ref >60)
Glucose, Bld: 98 mg/dL (ref 70–99)
Potassium: 4.3 mmol/L (ref 3.5–5.1)
Sodium: 139 mmol/L (ref 135–145)
Total Bilirubin: 1 mg/dL (ref 0.3–1.2)
Total Protein: 6.9 g/dL (ref 6.5–8.1)

## 2020-09-10 LAB — FERRITIN: Ferritin: 106 ng/mL (ref 11–307)

## 2020-09-11 LAB — FOLATE RBC
Folate, Hemolysate: 459 ng/mL
Folate, RBC: 1254 ng/mL (ref >498)
Hematocrit: 36.6 % (ref 34.0–46.6)

## 2020-09-14 ENCOUNTER — Telehealth: Payer: Self-pay

## 2020-09-14 NOTE — Telephone Encounter (Signed)
Accessed for chart review

## 2020-09-22 DIAGNOSIS — I129 Hypertensive chronic kidney disease with stage 1 through stage 4 chronic kidney disease, or unspecified chronic kidney disease: Secondary | ICD-10-CM | POA: Diagnosis not present

## 2020-09-22 DIAGNOSIS — N1831 Chronic kidney disease, stage 3a: Secondary | ICD-10-CM | POA: Diagnosis not present

## 2020-09-28 ENCOUNTER — Telehealth: Payer: Self-pay

## 2020-09-28 NOTE — Telephone Encounter (Signed)
This nurse spoke with patient and informed her per Dr. Burr Medico, that her Anemia has resolved.  Iron levels as well as B-12 levels were normal. Patient also request to know when it would be time for her next mammogram.  This nurse informed her that her last mammogram was done on 04/29/20 and the next one will be due in January 2023.  Reminded patient of her next appt of 9/19 with labs at 730 and office visit at 8am. All questions and concerns were addressed.  Patient knows to call clinic with any problems, questions or concerns.

## 2020-10-17 ENCOUNTER — Other Ambulatory Visit: Payer: Self-pay | Admitting: Cardiovascular Disease

## 2020-10-22 DIAGNOSIS — I129 Hypertensive chronic kidney disease with stage 1 through stage 4 chronic kidney disease, or unspecified chronic kidney disease: Secondary | ICD-10-CM | POA: Diagnosis not present

## 2020-10-22 DIAGNOSIS — N1831 Chronic kidney disease, stage 3a: Secondary | ICD-10-CM | POA: Diagnosis not present

## 2020-10-27 ENCOUNTER — Telehealth: Payer: Self-pay | Admitting: Cardiovascular Disease

## 2020-10-27 NOTE — Telephone Encounter (Signed)
Call placed to Pt to determine difficulty with metoprolol.  Per Pt she does not have any trouble with the metoprolol.  States only issue she has had recently is reducing allopurinol to once a day.  Returned call to [place] with [facility] and advised.  Per Caren Griffins, Pt told her something different.  She will call Pt to clarify and call our office if any further needs.

## 2020-10-27 NOTE — Telephone Encounter (Signed)
Pt c/o medication issue:  1. Name of Medicationmetoprolol tartrate (LOPRESSOR) 50 MG tablet  2. How are you currently taking this medication (dosage and times per day)? As prescribed  3. Are you having a reaction (difficulty breathing--STAT)? NO  4. What is your medication issue? Pt wants to confirm if she can take just one tablet a day due to her having issues taking one at night

## 2020-11-02 ENCOUNTER — Encounter (HOSPITAL_COMMUNITY): Payer: Self-pay

## 2020-11-06 ENCOUNTER — Ambulatory Visit: Payer: PPO

## 2020-11-06 ENCOUNTER — Ambulatory Visit: Payer: PPO | Admitting: Radiation Oncology

## 2020-11-11 ENCOUNTER — Emergency Department (HOSPITAL_COMMUNITY): Payer: PPO

## 2020-11-11 ENCOUNTER — Emergency Department (HOSPITAL_COMMUNITY)
Admission: EM | Admit: 2020-11-11 | Discharge: 2020-11-11 | Disposition: A | Discharge status: Home / Self Care | Payer: PPO | Attending: Emergency Medicine | Admitting: Emergency Medicine

## 2020-11-11 ENCOUNTER — Other Ambulatory Visit: Payer: Self-pay

## 2020-11-11 ENCOUNTER — Encounter (HOSPITAL_COMMUNITY): Payer: Self-pay | Admitting: *Deleted

## 2020-11-11 DIAGNOSIS — Z23 Encounter for immunization: Secondary | ICD-10-CM | POA: Diagnosis not present

## 2020-11-11 DIAGNOSIS — M47814 Spondylosis without myelopathy or radiculopathy, thoracic region: Secondary | ICD-10-CM | POA: Diagnosis not present

## 2020-11-11 DIAGNOSIS — M47816 Spondylosis without myelopathy or radiculopathy, lumbar region: Secondary | ICD-10-CM | POA: Diagnosis not present

## 2020-11-11 DIAGNOSIS — S300XXA Contusion of lower back and pelvis, initial encounter: Secondary | ICD-10-CM | POA: Diagnosis not present

## 2020-11-11 DIAGNOSIS — W19XXXA Unspecified fall, initial encounter: Secondary | ICD-10-CM

## 2020-11-11 DIAGNOSIS — W108XXA Fall (on) (from) other stairs and steps, initial encounter: Secondary | ICD-10-CM | POA: Insufficient documentation

## 2020-11-11 DIAGNOSIS — I4819 Other persistent atrial fibrillation: Secondary | ICD-10-CM | POA: Diagnosis not present

## 2020-11-11 DIAGNOSIS — S20229A Contusion of unspecified back wall of thorax, initial encounter: Secondary | ICD-10-CM | POA: Diagnosis not present

## 2020-11-11 DIAGNOSIS — N183 Chronic kidney disease, stage 3 unspecified: Secondary | ICD-10-CM | POA: Diagnosis not present

## 2020-11-11 DIAGNOSIS — Z853 Personal history of malignant neoplasm of breast: Secondary | ICD-10-CM | POA: Diagnosis not present

## 2020-11-11 DIAGNOSIS — I25118 Atherosclerotic heart disease of native coronary artery with other forms of angina pectoris: Secondary | ICD-10-CM | POA: Insufficient documentation

## 2020-11-11 DIAGNOSIS — Z951 Presence of aortocoronary bypass graft: Secondary | ICD-10-CM | POA: Insufficient documentation

## 2020-11-11 DIAGNOSIS — Z79899 Other long term (current) drug therapy: Secondary | ICD-10-CM | POA: Diagnosis not present

## 2020-11-11 DIAGNOSIS — S99922A Unspecified injury of left foot, initial encounter: Secondary | ICD-10-CM | POA: Diagnosis present

## 2020-11-11 DIAGNOSIS — S60221A Contusion of right hand, initial encounter: Secondary | ICD-10-CM | POA: Diagnosis not present

## 2020-11-11 DIAGNOSIS — M79671 Pain in right foot: Secondary | ICD-10-CM | POA: Diagnosis not present

## 2020-11-11 DIAGNOSIS — S0990XA Unspecified injury of head, initial encounter: Secondary | ICD-10-CM | POA: Insufficient documentation

## 2020-11-11 DIAGNOSIS — S92425A Nondisplaced fracture of distal phalanx of left great toe, initial encounter for closed fracture: Secondary | ICD-10-CM | POA: Insufficient documentation

## 2020-11-11 DIAGNOSIS — Z7901 Long term (current) use of anticoagulants: Secondary | ICD-10-CM | POA: Insufficient documentation

## 2020-11-11 DIAGNOSIS — I129 Hypertensive chronic kidney disease with stage 1 through stage 4 chronic kidney disease, or unspecified chronic kidney disease: Secondary | ICD-10-CM | POA: Insufficient documentation

## 2020-11-11 DIAGNOSIS — Z043 Encounter for examination and observation following other accident: Secondary | ICD-10-CM | POA: Diagnosis not present

## 2020-11-11 DIAGNOSIS — Z87891 Personal history of nicotine dependence: Secondary | ICD-10-CM | POA: Insufficient documentation

## 2020-11-11 DIAGNOSIS — M79641 Pain in right hand: Secondary | ICD-10-CM | POA: Diagnosis not present

## 2020-11-11 DIAGNOSIS — S0181XA Laceration without foreign body of other part of head, initial encounter: Secondary | ICD-10-CM | POA: Insufficient documentation

## 2020-11-11 DIAGNOSIS — Y92009 Unspecified place in unspecified non-institutional (private) residence as the place of occurrence of the external cause: Secondary | ICD-10-CM | POA: Diagnosis not present

## 2020-11-11 DIAGNOSIS — S199XXA Unspecified injury of neck, initial encounter: Secondary | ICD-10-CM | POA: Diagnosis not present

## 2020-11-11 MED ORDER — LIDOCAINE HCL (PF) 1 % IJ SOLN
INTRAMUSCULAR | Status: AC
  Filled 2020-11-11: qty 30

## 2020-11-11 MED ORDER — LIDOCAINE-EPINEPHRINE (PF) 2 %-1:200000 IJ SOLN
INTRAMUSCULAR | Status: AC
  Administered 2020-11-11: 20 mL
  Filled 2020-11-11: qty 20

## 2020-11-11 MED ORDER — POVIDONE-IODINE 10 % EX SOLN
CUTANEOUS | Status: AC
  Filled 2020-11-11: qty 15

## 2020-11-11 MED ORDER — LIDOCAINE-EPINEPHRINE (PF) 1 %-1:200000 IJ SOLN: 20.0000 mL | Freq: Once | INTRAMUSCULAR | Status: DC

## 2020-11-11 MED ORDER — TETANUS-DIPHTH-ACELL PERTUSSIS 5-2.5-18.5 LF-MCG/0.5 IM SUSY
0.5000 mL | PREFILLED_SYRINGE | Freq: Once | INTRAMUSCULAR | Status: AC
  Administered 2020-11-11: 0.5 mL via INTRAMUSCULAR
  Filled 2020-11-11: qty 0.5

## 2020-11-11 NOTE — ED Triage Notes (Signed)
Pt states she tripped down some steps and fell landing on her head; pt has large laceration to left side of head; pt also c/o right hand and foot pain

## 2020-11-11 NOTE — Discharge Instructions (Addendum)
Suture removal in 7 days.  Ice to area of swelling

## 2020-11-11 NOTE — ED Notes (Signed)
Pt undressed.  Abrasions to multiple area: 1.  Left forehead and cheek 2.  Left ear 3.  Right ring and middle fingers 4.  Left buttock 5.  Right great, 2nd and 3rd toes 6.  Right upper back 7. Left lower back
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
## 2020-11-15 ENCOUNTER — Other Ambulatory Visit: Payer: Self-pay | Admitting: Nurse Practitioner

## 2020-11-15 DIAGNOSIS — C50411 Malignant neoplasm of upper-outer quadrant of right female breast: Secondary | ICD-10-CM

## 2020-11-15 DIAGNOSIS — Z170 Estrogen receptor positive status [ER+]: Secondary | ICD-10-CM

## 2020-11-22 DIAGNOSIS — I10 Essential (primary) hypertension: Secondary | ICD-10-CM | POA: Diagnosis not present

## 2020-11-22 DIAGNOSIS — E119 Type 2 diabetes mellitus without complications: Secondary | ICD-10-CM | POA: Diagnosis not present

## 2020-11-26 DIAGNOSIS — Z6828 Body mass index (BMI) 28.0-28.9, adult: Secondary | ICD-10-CM | POA: Diagnosis not present

## 2020-11-26 DIAGNOSIS — R7309 Other abnormal glucose: Secondary | ICD-10-CM | POA: Diagnosis not present

## 2020-11-26 DIAGNOSIS — E663 Overweight: Secondary | ICD-10-CM | POA: Diagnosis not present

## 2020-11-26 DIAGNOSIS — I10 Essential (primary) hypertension: Secondary | ICD-10-CM | POA: Diagnosis not present

## 2020-11-26 DIAGNOSIS — M791 Myalgia, unspecified site: Secondary | ICD-10-CM | POA: Diagnosis not present

## 2020-11-26 DIAGNOSIS — I4821 Permanent atrial fibrillation: Secondary | ICD-10-CM | POA: Diagnosis not present

## 2020-11-26 DIAGNOSIS — C50911 Malignant neoplasm of unspecified site of right female breast: Secondary | ICD-10-CM | POA: Diagnosis not present

## 2020-11-26 DIAGNOSIS — Z1331 Encounter for screening for depression: Secondary | ICD-10-CM | POA: Diagnosis not present

## 2020-11-26 DIAGNOSIS — Z0000 Encounter for general adult medical examination without abnormal findings: Secondary | ICD-10-CM | POA: Diagnosis not present

## 2020-12-23 DIAGNOSIS — N1831 Chronic kidney disease, stage 3a: Secondary | ICD-10-CM | POA: Diagnosis not present

## 2020-12-23 DIAGNOSIS — I129 Hypertensive chronic kidney disease with stage 1 through stage 4 chronic kidney disease, or unspecified chronic kidney disease: Secondary | ICD-10-CM | POA: Diagnosis not present
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
## 2021-01-11 ENCOUNTER — Inpatient Hospital Stay: Payer: PPO

## 2021-01-11 ENCOUNTER — Other Ambulatory Visit: Payer: Self-pay

## 2021-01-11 ENCOUNTER — Encounter: Payer: Self-pay | Admitting: Hematology

## 2021-01-11 ENCOUNTER — Inpatient Hospital Stay: Payer: PPO | Attending: Hematology | Admitting: Hematology

## 2021-01-11 VITALS — BP 135/65 | HR 80 | Temp 98.5°F | Resp 18 | Ht 61.5 in | Wt 151.9 lb

## 2021-01-11 DIAGNOSIS — Z86718 Personal history of other venous thrombosis and embolism: Secondary | ICD-10-CM | POA: Diagnosis not present

## 2021-01-11 DIAGNOSIS — I10 Essential (primary) hypertension: Secondary | ICD-10-CM | POA: Insufficient documentation

## 2021-01-11 DIAGNOSIS — D649 Anemia, unspecified: Secondary | ICD-10-CM | POA: Insufficient documentation

## 2021-01-11 DIAGNOSIS — Z79810 Long term (current) use of selective estrogen receptor modulators (SERMs): Secondary | ICD-10-CM | POA: Insufficient documentation

## 2021-01-11 DIAGNOSIS — C50411 Malignant neoplasm of upper-outer quadrant of right female breast: Secondary | ICD-10-CM | POA: Diagnosis not present

## 2021-01-11 DIAGNOSIS — Z170 Estrogen receptor positive status [ER+]: Secondary | ICD-10-CM | POA: Diagnosis not present

## 2021-01-11 DIAGNOSIS — Z7901 Long term (current) use of anticoagulants: Secondary | ICD-10-CM | POA: Diagnosis not present

## 2021-01-11 DIAGNOSIS — I4891 Unspecified atrial fibrillation: Secondary | ICD-10-CM | POA: Insufficient documentation

## 2021-01-11 DIAGNOSIS — N183 Chronic kidney disease, stage 3 unspecified: Secondary | ICD-10-CM | POA: Insufficient documentation

## 2021-01-11 DIAGNOSIS — Z79899 Other long term (current) drug therapy: Secondary | ICD-10-CM | POA: Insufficient documentation

## 2021-01-11 DIAGNOSIS — Z85819 Personal history of malignant neoplasm of unspecified site of lip, oral cavity, and pharynx: Secondary | ICD-10-CM | POA: Diagnosis not present

## 2021-01-11 LAB — CBC WITH DIFFERENTIAL (CANCER CENTER ONLY)
Abs Immature Granulocytes: 0.01 10*3/uL (ref 0.00–0.07)
Basophils Absolute: 0.1 10*3/uL (ref 0.0–0.1)
Basophils Relative: 1 %
Eosinophils Absolute: 0.2 10*3/uL (ref 0.0–0.5)
Eosinophils Relative: 2 %
HCT: 34.8 % — ABNORMAL LOW (ref 36.0–46.0)
Hemoglobin: 11.3 g/dL — ABNORMAL LOW (ref 12.0–15.0)
Immature Granulocytes: 0 %
Lymphocytes Relative: 27 %
Lymphs Abs: 1.9 10*3/uL (ref 0.7–4.0)
MCH: 31.2 pg (ref 26.0–34.0)
MCHC: 32.5 g/dL (ref 30.0–36.0)
MCV: 96.1 fL (ref 80.0–100.0)
Monocytes Absolute: 0.7 10*3/uL (ref 0.1–1.0)
Monocytes Relative: 10 %
Neutro Abs: 4.1 10*3/uL (ref 1.7–7.7)
Neutrophils Relative %: 60 %
Platelet Count: 192 10*3/uL (ref 150–400)
RBC: 3.62 MIL/uL — ABNORMAL LOW (ref 3.87–5.11)
RDW: 13.6 % (ref 11.5–15.5)
WBC Count: 6.9 10*3/uL (ref 4.0–10.5)
nRBC: 0 % (ref 0.0–0.2)

## 2021-01-11 LAB — CMP (CANCER CENTER ONLY)
ALT: 23 U/L (ref 0–44)
AST: 33 U/L (ref 15–41)
Albumin: 3.6 g/dL (ref 3.5–5.0)
Alkaline Phosphatase: 61 U/L (ref 38–126)
Anion gap: 9 (ref 5–15)
BUN: 22 mg/dL (ref 8–23)
CO2: 29 mmol/L (ref 22–32)
Calcium: 9.4 mg/dL (ref 8.9–10.3)
Chloride: 104 mmol/L (ref 98–111)
Creatinine: 1.28 mg/dL — ABNORMAL HIGH (ref 0.44–1.00)
GFR, Estimated: 42 mL/min — ABNORMAL LOW (ref >60)
Glucose, Bld: 95 mg/dL (ref 70–99)
Potassium: 4.5 mmol/L (ref 3.5–5.1)
Sodium: 142 mmol/L (ref 135–145)
Total Bilirubin: 0.7 mg/dL (ref 0.3–1.2)
Total Protein: 7.4 g/dL (ref 6.5–8.1)

## 2021-01-12 ENCOUNTER — Other Ambulatory Visit: Payer: Self-pay | Admitting: Cardiovascular Disease

## 2021-01-12 NOTE — Telephone Encounter (Signed)
48f, 71.8kg, scr 1.25(07/10/20), ccr 40, lovw/croitoru(04/20/20)

## 2021-01-22 DIAGNOSIS — N1831 Chronic kidney disease, stage 3a: Secondary | ICD-10-CM | POA: Diagnosis not present

## 2021-01-22 DIAGNOSIS — I129 Hypertensive chronic kidney disease with stage 1 through stage 4 chronic kidney disease, or unspecified chronic kidney disease: Secondary | ICD-10-CM | POA: Diagnosis not present

## 2021-02-10 ENCOUNTER — Other Ambulatory Visit: Payer: Self-pay | Admitting: Cardiovascular Disease

## 2021-02-22 DIAGNOSIS — N1831 Chronic kidney disease, stage 3a: Secondary | ICD-10-CM | POA: Diagnosis not present

## 2021-02-22 DIAGNOSIS — I129 Hypertensive chronic kidney disease with stage 1 through stage 4 chronic kidney disease, or unspecified chronic kidney disease: Secondary | ICD-10-CM | POA: Diagnosis not present

## 2021-03-11 DIAGNOSIS — I129 Hypertensive chronic kidney disease with stage 1 through stage 4 chronic kidney disease, or unspecified chronic kidney disease: Secondary | ICD-10-CM | POA: Diagnosis not present

## 2021-03-11 DIAGNOSIS — Z6835 Body mass index (BMI) 35.0-35.9, adult: Secondary | ICD-10-CM | POA: Diagnosis not present

## 2021-03-11 DIAGNOSIS — M109 Gout, unspecified: Secondary | ICD-10-CM | POA: Diagnosis not present

## 2021-03-11 DIAGNOSIS — N183 Chronic kidney disease, stage 3 unspecified: Secondary | ICD-10-CM | POA: Diagnosis not present

## 2021-03-24 DIAGNOSIS — I129 Hypertensive chronic kidney disease with stage 1 through stage 4 chronic kidney disease, or unspecified chronic kidney disease: Secondary | ICD-10-CM | POA: Diagnosis not present

## 2021-03-24 DIAGNOSIS — N1831 Chronic kidney disease, stage 3a: Secondary | ICD-10-CM | POA: Diagnosis not present

## 2021-04-14 DIAGNOSIS — Z6827 Body mass index (BMI) 27.0-27.9, adult: Secondary | ICD-10-CM | POA: Diagnosis not present

## 2021-04-14 DIAGNOSIS — Z01419 Encounter for gynecological examination (general) (routine) without abnormal findings: Secondary | ICD-10-CM | POA: Diagnosis not present

## 2021-04-23 DIAGNOSIS — N1831 Chronic kidney disease, stage 3a: Secondary | ICD-10-CM | POA: Diagnosis not present

## 2021-04-23 DIAGNOSIS — I129 Hypertensive chronic kidney disease with stage 1 through stage 4 chronic kidney disease, or unspecified chronic kidney disease: Secondary | ICD-10-CM | POA: Diagnosis not present

## 2021-05-03 ENCOUNTER — Ambulatory Visit
Admission: RE | Admit: 2021-05-03 | Discharge: 2021-05-03 | Disposition: A | Discharge status: Home / Self Care | Payer: PPO | Source: Ambulatory Visit | Attending: Hematology | Admitting: Hematology

## 2021-05-03 ENCOUNTER — Other Ambulatory Visit: Payer: Self-pay

## 2021-05-03 ENCOUNTER — Other Ambulatory Visit: Payer: Self-pay | Admitting: Hematology

## 2021-05-03 DIAGNOSIS — Z170 Estrogen receptor positive status [ER+]: Secondary | ICD-10-CM

## 2021-05-03 DIAGNOSIS — R922 Inconclusive mammogram: Secondary | ICD-10-CM | POA: Diagnosis not present

## 2021-05-03 DIAGNOSIS — Z853 Personal history of malignant neoplasm of breast: Secondary | ICD-10-CM | POA: Diagnosis not present

## 2021-05-12 ENCOUNTER — Ambulatory Visit: Payer: PPO | Admitting: Physician Assistant

## 2021-05-12 DIAGNOSIS — H25813 Combined forms of age-related cataract, bilateral: Secondary | ICD-10-CM | POA: Diagnosis not present

## 2021-05-17 ENCOUNTER — Other Ambulatory Visit: Payer: Self-pay | Admitting: Cardiovascular Disease

## 2021-05-17 ENCOUNTER — Other Ambulatory Visit: Payer: Self-pay | Admitting: Nurse Practitioner

## 2021-05-17 DIAGNOSIS — Z170 Estrogen receptor positive status [ER+]: Secondary | ICD-10-CM

## 2021-05-17 DIAGNOSIS — C50411 Malignant neoplasm of upper-outer quadrant of right female breast: Secondary | ICD-10-CM
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
## 2021-05-20 ENCOUNTER — Ambulatory Visit: Payer: PPO | Admitting: Physician Assistant

## 2021-05-20 ENCOUNTER — Encounter: Payer: Self-pay | Admitting: Physician Assistant

## 2021-05-20 ENCOUNTER — Emergency Department (HOSPITAL_COMMUNITY): Payer: PPO

## 2021-05-20 ENCOUNTER — Emergency Department (HOSPITAL_COMMUNITY)
Admission: EM | Admit: 2021-05-20 | Discharge: 2021-05-20 | Disposition: A | Discharge status: Home / Self Care | Payer: PPO | Attending: Emergency Medicine | Admitting: Emergency Medicine

## 2021-05-20 ENCOUNTER — Telehealth: Payer: Self-pay

## 2021-05-20 ENCOUNTER — Encounter (HOSPITAL_COMMUNITY): Payer: Self-pay | Admitting: *Deleted

## 2021-05-20 ENCOUNTER — Other Ambulatory Visit: Payer: Self-pay

## 2021-05-20 VITALS — BP 119/60 | HR 89 | Ht 61.05 in | Wt 146.8 lb

## 2021-05-20 DIAGNOSIS — I2510 Atherosclerotic heart disease of native coronary artery without angina pectoris: Secondary | ICD-10-CM | POA: Insufficient documentation

## 2021-05-20 DIAGNOSIS — I25708 Atherosclerosis of coronary artery bypass graft(s), unspecified, with other forms of angina pectoris: Secondary | ICD-10-CM

## 2021-05-20 DIAGNOSIS — Z853 Personal history of malignant neoplasm of breast: Secondary | ICD-10-CM | POA: Diagnosis not present

## 2021-05-20 DIAGNOSIS — Z79899 Other long term (current) drug therapy: Secondary | ICD-10-CM | POA: Diagnosis not present

## 2021-05-20 DIAGNOSIS — I129 Hypertensive chronic kidney disease with stage 1 through stage 4 chronic kidney disease, or unspecified chronic kidney disease: Secondary | ICD-10-CM | POA: Diagnosis not present

## 2021-05-20 DIAGNOSIS — I10 Essential (primary) hypertension: Secondary | ICD-10-CM | POA: Diagnosis not present

## 2021-05-20 DIAGNOSIS — W19XXXA Unspecified fall, initial encounter: Secondary | ICD-10-CM

## 2021-05-20 DIAGNOSIS — Z7901 Long term (current) use of anticoagulants: Secondary | ICD-10-CM | POA: Insufficient documentation

## 2021-05-20 DIAGNOSIS — N183 Chronic kidney disease, stage 3 unspecified: Secondary | ICD-10-CM | POA: Diagnosis not present

## 2021-05-20 DIAGNOSIS — Y92531 Health care provider office as the place of occurrence of the external cause: Secondary | ICD-10-CM | POA: Insufficient documentation

## 2021-05-20 DIAGNOSIS — S82035A Nondisplaced transverse fracture of left patella, initial encounter for closed fracture: Secondary | ICD-10-CM | POA: Diagnosis not present

## 2021-05-20 DIAGNOSIS — M1712 Unilateral primary osteoarthritis, left knee: Secondary | ICD-10-CM | POA: Insufficient documentation

## 2021-05-20 DIAGNOSIS — I4821 Permanent atrial fibrillation: Secondary | ICD-10-CM | POA: Diagnosis not present

## 2021-05-20 DIAGNOSIS — S8992XA Unspecified injury of left lower leg, initial encounter: Secondary | ICD-10-CM | POA: Diagnosis present

## 2021-05-20 DIAGNOSIS — S01511A Laceration without foreign body of lip, initial encounter: Secondary | ICD-10-CM | POA: Diagnosis not present

## 2021-05-20 DIAGNOSIS — S82034A Nondisplaced transverse fracture of right patella, initial encounter for closed fracture: Secondary | ICD-10-CM | POA: Diagnosis not present

## 2021-05-20 DIAGNOSIS — I672 Cerebral atherosclerosis: Secondary | ICD-10-CM | POA: Diagnosis not present

## 2021-05-20 DIAGNOSIS — M25562 Pain in left knee: Secondary | ICD-10-CM | POA: Diagnosis not present

## 2021-05-20 DIAGNOSIS — M7989 Other specified soft tissue disorders: Secondary | ICD-10-CM | POA: Diagnosis not present

## 2021-05-20 DIAGNOSIS — M11262 Other chondrocalcinosis, left knee: Secondary | ICD-10-CM | POA: Insufficient documentation

## 2021-05-20 DIAGNOSIS — I051 Rheumatic mitral insufficiency: Secondary | ICD-10-CM | POA: Diagnosis not present

## 2021-05-20 DIAGNOSIS — E7800 Pure hypercholesterolemia, unspecified: Secondary | ICD-10-CM

## 2021-05-20 DIAGNOSIS — W1839XA Other fall on same level, initial encounter: Secondary | ICD-10-CM | POA: Diagnosis not present

## 2021-05-20 DIAGNOSIS — S0990XA Unspecified injury of head, initial encounter: Secondary | ICD-10-CM | POA: Insufficient documentation

## 2021-05-20 DIAGNOSIS — R600 Localized edema: Secondary | ICD-10-CM | POA: Diagnosis not present

## 2021-05-20 DIAGNOSIS — S0993XA Unspecified injury of face, initial encounter: Secondary | ICD-10-CM | POA: Diagnosis not present

## 2021-05-20 NOTE — Telephone Encounter (Signed)
Received a call in triage from front office staff stating that pt that was previously seen today by Caron Presume, PA returned to our lobby after sustaining a fall in the parking garage. Pt was attempting to leave and had a fall. Pt was accompanied by her friend who helped her up and escorted her back into the building along with Georgana Curio, RN, who happened to be walking by and noticed that the pt was on the ground and bleeding. Pt was parked on [address] of our parking garage and it appears that she missed an incline step. This RN did not witness the fall but location was noted by Georgana Curio, RN and by blood on the ground.   Pt sustained abrasions to her mouth, lips, left thumb, right side of hand, and left knee. Pt's lip is busted in multiple locations and pt states that her teeth feel a little loose. Pt states that she has a dentist appointment next week. Advised pt that she should probably give that office a call and let them know about this event in case they want to see pt earlier than next week. Pt's face and lips were cleaned with gauze and normal saline. Pt's left thumb and right hand were cleaned with gauze and saline and bandaged with bandaids. Pt's left knee was cleaned with gauze and saline and bandaged with clean dry guaze and tape. Pt states that he knee feels "out of whack." When pt offered a wheel chair pt does put weight on left leg but does not bend left knee. Pt is noted to be on xarelto.  Pt was advised to proceed to the ED to be evaluated. Pt states that she will not be going to the ED right now, she would like to go home. Pt was escorted to the car by [employer] via wheelchair. Veronda Prude, LPN came with this RN to the lobby and helped with cleaning pt and bandaging the pt.

## 2021-05-20 NOTE — Patient Instructions (Signed)
Medication Instructions:  No Changes *If you need a refill on your cardiac medications before your next appointment, please call your pharmacy*   Lab Work: No Labs If you have labs (blood work) drawn today and your tests are completely normal, you will receive your results only by: MyChart Message (if you have MyChart) OR A paper copy in the mail If you have any lab test that is abnormal or we need to change your treatment, we will call you to review the results.   Testing/Procedures: No Testing   Follow-Up: At [facility], you and your health needs are our priority.  As part of our continuing mission to provide you with exceptional heart care, we have created designated Provider Care Teams.  These Care Teams include your primary Cardiologist (physician) and Advanced Practice Providers (APPs -  Physician Assistants and Nurse Practitioners) who all work together to provide you with the care you need, when you need it.  We recommend signing up for the patient portal called "MyChart".  Sign up information is provided on this After Visit Summary.  MyChart is used to connect with patients for Virtual Visits (Telemedicine).  Patients are able to view lab/test results, encounter notes, upcoming appointments, etc.  Non-urgent messages can be sent to your provider as well.   To learn more about what you can do with MyChart, go to [email or web address].    Your next appointment:   1 year(s)  The format for your next appointment:   In Person  Provider:   Mihai Croitoru, MD       
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
## 2021-05-20 NOTE — ED Triage Notes (Signed)
[place] coming out of the doctor's office this am, facial trauma around mouth and left knee pain

## 2021-05-20 NOTE — ED Notes (Signed)
Tried to get pt into gown did not want to. Advised she had metal in bra and ordering a CT pt still wont get in a gown.

## 2021-05-21 DIAGNOSIS — M25562 Pain in left knee: Secondary | ICD-10-CM | POA: Diagnosis not present

## 2021-05-21 DIAGNOSIS — S82002A Unspecified fracture of left patella, initial encounter for closed fracture: Secondary | ICD-10-CM | POA: Diagnosis not present

## 2021-05-25 DIAGNOSIS — N1831 Chronic kidney disease, stage 3a: Secondary | ICD-10-CM | POA: Diagnosis not present

## 2021-05-25 DIAGNOSIS — I129 Hypertensive chronic kidney disease with stage 1 through stage 4 chronic kidney disease, or unspecified chronic kidney disease: Secondary | ICD-10-CM | POA: Diagnosis not present

## 2021-06-04 DIAGNOSIS — M25562 Pain in left knee: Secondary | ICD-10-CM | POA: Diagnosis not present

## 2021-06-14 ENCOUNTER — Other Ambulatory Visit: Payer: Self-pay | Admitting: Cardiovascular Disease

## 2021-06-18 ENCOUNTER — Other Ambulatory Visit: Payer: Self-pay | Admitting: Cardiovascular Disease

## 2021-06-18 DIAGNOSIS — M25562 Pain in left knee: Secondary | ICD-10-CM | POA: Diagnosis not present

## 2021-06-24 DIAGNOSIS — M25569 Pain in unspecified knee: Secondary | ICD-10-CM | POA: Diagnosis not present

## 2021-07-01 DIAGNOSIS — M25569 Pain in unspecified knee: Secondary | ICD-10-CM | POA: Diagnosis not present

## 2021-07-08 ENCOUNTER — Other Ambulatory Visit: Payer: Self-pay | Admitting: Cardiovascular Disease

## 2021-07-08 DIAGNOSIS — M25569 Pain in unspecified knee: Secondary | ICD-10-CM | POA: Diagnosis not present

## 2021-07-08 NOTE — Telephone Encounter (Signed)
Prescription refill request for Xarelto received.  ?Indication:Afib ?Last office visit:1/23 ?Weight:66.6 kg ?Age:81 ?Scr:1.2 ?CrCl:38 ml/min ? ?Prescription refilled ? ?

## 2021-07-09 ENCOUNTER — Other Ambulatory Visit: Payer: Self-pay | Admitting: *Deleted

## 2021-07-09 DIAGNOSIS — D649 Anemia, unspecified: Secondary | ICD-10-CM

## 2021-07-09 DIAGNOSIS — C50411 Malignant neoplasm of upper-outer quadrant of right female breast: Secondary | ICD-10-CM

## 2021-07-12 ENCOUNTER — Inpatient Hospital Stay: Payer: PPO | Attending: Family Medicine

## 2021-07-12 ENCOUNTER — Inpatient Hospital Stay: Payer: PPO | Admitting: Hematology

## 2021-07-12 DIAGNOSIS — C50411 Malignant neoplasm of upper-outer quadrant of right female breast: Secondary | ICD-10-CM

## 2021-07-16 DIAGNOSIS — M25562 Pain in left knee: Secondary | ICD-10-CM | POA: Diagnosis not present

## 2021-08-12 DIAGNOSIS — H903 Sensorineural hearing loss, bilateral: Secondary | ICD-10-CM | POA: Diagnosis not present

## 2021-08-19 DIAGNOSIS — H40013 Open angle with borderline findings, low risk, bilateral: Secondary | ICD-10-CM | POA: Diagnosis not present

## 2021-08-19 DIAGNOSIS — H01001 Unspecified blepharitis right upper eyelid: Secondary | ICD-10-CM | POA: Diagnosis not present

## 2021-08-19 DIAGNOSIS — H01002 Unspecified blepharitis right lower eyelid: Secondary | ICD-10-CM | POA: Diagnosis not present

## 2021-08-19 DIAGNOSIS — H25812 Combined forms of age-related cataract, left eye: Secondary | ICD-10-CM | POA: Diagnosis not present

## 2021-08-19 DIAGNOSIS — H02822 Cysts of right lower eyelid: Secondary | ICD-10-CM | POA: Diagnosis not present

## 2021-09-22 DIAGNOSIS — I129 Hypertensive chronic kidney disease with stage 1 through stage 4 chronic kidney disease, or unspecified chronic kidney disease: Secondary | ICD-10-CM | POA: Diagnosis not present

## 2021-09-22 DIAGNOSIS — N1831 Chronic kidney disease, stage 3a: Secondary | ICD-10-CM | POA: Diagnosis not present

## 2021-09-30 DIAGNOSIS — H2511 Age-related nuclear cataract, right eye: Secondary | ICD-10-CM | POA: Diagnosis not present

## 2021-10-01 NOTE — H&P (Signed)
Surgical History & Physical  Patient Name: Sandra Bradford DOB: 09-07-1940  Surgery: Cataract extraction with intraocular lens implant phacoemulsification; Right Eye  Surgeon: Baruch Goldmann MD Surgery Date:  10-08-21 Pre-Op Date:  09-27-21  HPI: A 65 Yr. old female patient presents for a Cataract Evaluation, referred by Dr. Jorja Loa. The patient complains of difficulty when driving, which began many years ago. Both eyes are affected. The episode is constant and gradual. The condition's severity decreased since last visit. The complaint is associated with blurry vision. The patient has difficulty driving at night and during the day due to decrease of vision OU. The patient also has difficulty with other daily activities like seeing and following the golf ball, and reading the computer screen. The patient also has fallen down the stairs and her family believes it is due to her vision. This is negatively affecting the patient's quality of life and the patient is unable to function adequately in life with the current level of vision. The patient experiences no eye pain and no flashes, floater, shadow, curtain or veil. HPI Completed by Dr. Baruch Goldmann  Medical History: Cataracts Glaucoma Arthritis Heart Problem Kidney Dis LDL  Review of Systems Negative Allergic/Immunologic Negative Cardiovascular Negative Constitutional Negative Ear, Nose, Mouth & Throat Negative Endocrine Negative Eyes Negative Gastrointestinal Negative Genitourinary Negative Hemotologic/Lymphatic Negative Integumentary Negative Musculoskeletal Negative Neurological Negative Psychiatry Negative Respiratory  Social   Never smoked   Medication Allopurinol, Repatha,   Sx/Procedures  Bypass Surgery (5X), Gallbladder Sx,   Drug Allergies  Codeine, Statin drugs,   History & Physical: Heent: Cataract, Right eye NECK: supple without bruits LUNGS: lungs clear to auscultation CV: regular rate and rhythm Abdomen:  soft and non-tender Impression & Plan: Assessment: 1.  COMBINED FORMS AGE RELATED CATARACT; Left Eye (H25.812) 2.  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  BLEPHARITIS; Right Upper Lid, Right Lower Lid, Left Upper Lid, Left Lower Lid (H01.001, H01.002,H01.004,H01.005) 4.  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  Cataract accounts for the patient's decreased vision. This visual impairment is not correctable with a tolerable change in glasses or contact lenses. Cataract surgery with an implantation of a new lens should significantly improve the visual and functional status of the patient. Discussed all risks, benefits, alternatives, and potential complications. Discussed the procedures and recovery. Patient desires to have surgery. A-scan ordered and performed today for intra-ocular lens calculations. The surgery will be performed in order to improve vision for driving, reading, and for eye examinations. Recommend phacoemulsification with intra-ocular lens. Recommend Dextenza for post-operative pain and inflammation. Right Eye worse - first.. Dilates poorly - shugarcaine by protocol. Omidira. Toric Lens.  2.  meibomian gland cyst. Can address after cataract surgery.  3.  Recommend regular lid cleaning.  4.  Observe; Artificial tears as needed for irritation.  5.  Stable- Monitor  6.  Based on cup-to-disc ratio. Negative Family history. OCT rNFL shows IOPs WNL OU. Detailed discussion about glaucoma today including importance of maintaining good follow up and following treatment plan, and the possibility of irreversible blindness as part of this disease process.

## 2021-10-04 ENCOUNTER — Encounter (HOSPITAL_COMMUNITY)
Admission: RE | Admit: 2021-10-04 | Discharge: 2021-10-04 | Disposition: A | Discharge status: Home / Self Care | Payer: PPO | Source: Ambulatory Visit | Attending: Ophthalmology | Admitting: Ophthalmology

## 2021-10-08 ENCOUNTER — Ambulatory Visit (HOSPITAL_BASED_OUTPATIENT_CLINIC_OR_DEPARTMENT_OTHER): Payer: PPO | Admitting: Anesthesiology

## 2021-10-08 ENCOUNTER — Ambulatory Visit (HOSPITAL_COMMUNITY)
Admission: RE | Admit: 2021-10-08 | Discharge: 2021-10-08 | Disposition: A | Discharge status: Home / Self Care | Payer: PPO | Attending: Ophthalmology | Admitting: Ophthalmology

## 2021-10-08 ENCOUNTER — Encounter (HOSPITAL_COMMUNITY)
Admission: RE | Disposition: A | Discharge status: Home / Self Care | Payer: Self-pay | Source: Home / Self Care | Attending: Ophthalmology

## 2021-10-08 ENCOUNTER — Encounter (HOSPITAL_COMMUNITY): Payer: Self-pay | Admitting: Ophthalmology

## 2021-10-08 ENCOUNTER — Ambulatory Visit (HOSPITAL_COMMUNITY): Payer: PPO | Admitting: Anesthesiology

## 2021-10-08 DIAGNOSIS — H0100B Unspecified blepharitis left eye, upper and lower eyelids: Secondary | ICD-10-CM | POA: Insufficient documentation

## 2021-10-08 DIAGNOSIS — H52221 Regular astigmatism, right eye: Secondary | ICD-10-CM | POA: Diagnosis not present

## 2021-10-08 DIAGNOSIS — H2511 Age-related nuclear cataract, right eye: Secondary | ICD-10-CM | POA: Diagnosis not present

## 2021-10-08 DIAGNOSIS — H0100A Unspecified blepharitis right eye, upper and lower eyelids: Secondary | ICD-10-CM | POA: Diagnosis not present

## 2021-10-08 DIAGNOSIS — I10 Essential (primary) hypertension: Secondary | ICD-10-CM | POA: Diagnosis not present

## 2021-10-08 DIAGNOSIS — N183 Chronic kidney disease, stage 3 unspecified: Secondary | ICD-10-CM | POA: Diagnosis not present

## 2021-10-08 DIAGNOSIS — I2510 Atherosclerotic heart disease of native coronary artery without angina pectoris: Secondary | ICD-10-CM | POA: Diagnosis not present

## 2021-10-08 DIAGNOSIS — H52201 Unspecified astigmatism, right eye: Secondary | ICD-10-CM | POA: Diagnosis not present

## 2021-10-08 DIAGNOSIS — H0012 Chalazion right lower eyelid: Secondary | ICD-10-CM | POA: Insufficient documentation

## 2021-10-08 DIAGNOSIS — H18519 Endothelial corneal dystrophy, unspecified eye: Secondary | ICD-10-CM | POA: Diagnosis not present

## 2021-10-08 DIAGNOSIS — I129 Hypertensive chronic kidney disease with stage 1 through stage 4 chronic kidney disease, or unspecified chronic kidney disease: Secondary | ICD-10-CM | POA: Diagnosis not present

## 2021-10-08 DIAGNOSIS — I493 Ventricular premature depolarization: Secondary | ICD-10-CM | POA: Insufficient documentation

## 2021-10-08 SURGERY — PHACOEMULSIFICATION, CATARACT, WITH IOL INSERTION
Anesthesia: Monitor Anesthesia Care | Site: Eye | Laterality: Right

## 2021-10-08 MED ORDER — LIDOCAINE HCL (PF) 1 % IJ SOLN
INTRAOCULAR | Status: DC | PRN
  Administered 2021-10-08: 1 mL via OPHTHALMIC

## 2021-10-08 MED ORDER — PHENYLEPHRINE HCL 2.5 % OP SOLN
1.0000 [drp] | OPHTHALMIC | Status: AC | PRN
  Administered 2021-10-08 (×3): 1 [drp] via OPHTHALMIC

## 2021-10-08 MED ORDER — NEOMYCIN-POLYMYXIN-DEXAMETH 3.5-10000-0.1 OP SUSP
OPHTHALMIC | Status: DC | PRN
  Administered 2021-10-08: 1 [drp] via OPHTHALMIC

## 2021-10-08 MED ORDER — LIDOCAINE HCL 3.5 % OP GEL: Freq: Once | OPHTHALMIC | Status: DC

## 2021-10-08 MED ORDER — EPINEPHRINE PF 1 MG/ML IJ SOLN
INTRAMUSCULAR | Status: AC
  Filled 2021-10-08: qty 2

## 2021-10-08 MED ORDER — SODIUM HYALURONATE 10 MG/ML IO SOLUTION
PREFILLED_SYRINGE | INTRAOCULAR | Status: DC | PRN
  Administered 2021-10-08: 0.85 mL via INTRAOCULAR

## 2021-10-08 MED ORDER — BSS IO SOLN
INTRAOCULAR | Status: DC | PRN
  Administered 2021-10-08: 15 mL via INTRAOCULAR

## 2021-10-08 MED ORDER — PHENYLEPHRINE-KETOROLAC 1-0.3 % IO SOLN
INTRAOCULAR | Status: AC
  Filled 2021-10-08: qty 4

## 2021-10-08 MED ORDER — STERILE WATER FOR IRRIGATION IR SOLN
Status: DC | PRN
  Administered 2021-10-08: 250 mL

## 2021-10-08 MED ORDER — TETRACAINE HCL 0.5 % OP SOLN
1.0000 [drp] | OPHTHALMIC | Status: AC | PRN
  Administered 2021-10-08 (×3): 1 [drp] via OPHTHALMIC

## 2021-10-08 MED ORDER — PHENYLEPHRINE-KETOROLAC 1-0.3 % IO SOLN
INTRAOCULAR | Status: DC | PRN
  Administered 2021-10-08: 500 mL via OPHTHALMIC

## 2021-10-08 MED ORDER — TROPICAMIDE 1 % OP SOLN
1.0000 [drp] | OPHTHALMIC | Status: AC | PRN
  Administered 2021-10-08 (×3): 1 [drp] via OPHTHALMIC

## 2021-10-08 MED ORDER — SODIUM HYALURONATE 23MG/ML IO SOSY
PREFILLED_SYRINGE | INTRAOCULAR | Status: DC | PRN
  Administered 2021-10-08: 0.6 mL via INTRAOCULAR

## 2021-10-08 MED ORDER — POVIDONE-IODINE 5 % OP SOLN
OPHTHALMIC | Status: DC | PRN
  Administered 2021-10-08: 1 via OPHTHALMIC

## 2021-10-08 SURGICAL SUPPLY — 14 items
CATARACT SUITE SIGHTPATH (MISCELLANEOUS) ×2 IMPLANT
CLOTH BEACON ORANGE TIMEOUT ST (SAFETY) ×2 IMPLANT
EYE SHIELD UNIVERSAL CLEAR (GAUZE/BANDAGES/DRESSINGS) ×1 IMPLANT
FEE CATARACT SUITE SIGHTPATH (MISCELLANEOUS) ×1 IMPLANT
GLOVE BIOGEL PI IND STRL 7.0 (GLOVE) ×2 IMPLANT
GLOVE BIOGEL PI INDICATOR 7.0 (GLOVE) ×2
LENS IOL EYHANCE TORIC II 21.5 ×2 IMPLANT
LENS IOL EYHANCE TRC 150 21.5 IMPLANT
LENS IOL EYHNC TORIC 150 21.5 ×1 IMPLANT
PAD ARMBOARD 7.5X6 YLW CONV (MISCELLANEOUS) ×2 IMPLANT
SYR TB 1ML LL NO SAFETY (SYRINGE) ×2 IMPLANT
TAPE SURG TRANSPORE 1 IN (GAUZE/BANDAGES/DRESSINGS) IMPLANT
TAPE SURGICAL TRANSPORE 1 IN (GAUZE/BANDAGES/DRESSINGS) ×2
WATER STERILE IRR 250ML POUR (IV SOLUTION) ×2 IMPLANT

## 2021-10-08 NOTE — Transfer of Care (Signed)
Immediate Anesthesia Transfer of Care Note  Patient: Sandra Bradford  Procedure(s) Performed: CATARACT EXTRACTION PHACO AND INTRAOCULAR LENS PLACEMENT (IOC) (Right: Eye)  Patient Location: Short Stay  Anesthesia Type:MAC  Level of Consciousness: awake  Airway & Oxygen Therapy: Patient Spontanous Breathing  Post-op Assessment: Report given to RN  Post vital signs: Reviewed and stable  Last Vitals:  Vitals Value Taken Time  BP    Temp    Pulse    Resp    SpO2      Last Pain:  Vitals:   10/08/21 0754  TempSrc: Oral  PainSc: 0-No pain         Complications: No notable events documented.

## 2021-10-08 NOTE — Interval H&P Note (Signed)
History and Physical Interval Note:  10/08/2021 8:21 AM  Sandra Bradford  has presented today for surgery, with the diagnosis of combined forms age related cataract; right.  The various methods of treatment have been discussed with the patient and family. After consideration of risks, benefits and other options for treatment, the patient has consented to  Procedure(s) with comments: CATARACT EXTRACTION PHACO AND INTRAOCULAR LENS PLACEMENT (IOC) (Right) - right as a surgical intervention.  The patient's history has been reviewed, patient examined, no change in status, stable for surgery.  I have reviewed the patient's chart and labs.  Questions were answered to the patient's satisfaction.     Baruch Goldmann

## 2021-10-08 NOTE — Discharge Instructions (Signed)
Please discharge patient when stable, will follow up today with Dr. Billye Nydam at the [facility] [place] office at 10:10AM following discharge.  Leave shield in place until visit.  All paperwork with discharge instructions will be given at the office.  [facility] Address:  [address]  [place], [place] [address]  

## 2021-10-08 NOTE — Anesthesia Preprocedure Evaluation (Addendum)
Anesthesia Evaluation  Patient identified by MRN, date of birth, ID band Patient awake    Reviewed: Allergy & Precautions, H&P , NPO status , Patient's Chart, lab work & pertinent test results, reviewed documented beta blocker date and time   History of Anesthesia Complications (+) PONV and history of anesthetic complications  Airway Mallampati: II  TM Distance: >3 FB Neck ROM: full    Dental no notable dental hx.    Pulmonary shortness of breath,    Pulmonary exam normal breath sounds clear to auscultation       Cardiovascular Exercise Tolerance: Good hypertension, + CAD and + Peripheral Vascular Disease   Rhythm:Irregular Rate:Normal  Afib   Neuro/Psych negative neurological ROS  negative psych ROS   GI/Hepatic negative GI ROS, Neg liver ROS,   Endo/Other  negative endocrine ROS  Renal/GU CRFRenal disease  negative genitourinary   Musculoskeletal   Abdominal   Peds  Hematology negative hematology ROS (+)   Anesthesia Other Findings   Reproductive/Obstetrics negative OB ROS                            Anesthesia Physical Anesthesia Plan  ASA: 3  Anesthesia Plan: MAC   Post-op Pain Management:    Induction:   PONV Risk Score and Plan:   Airway Management Planned:   Additional Equipment:   Intra-op Plan:   Post-operative Plan:   Informed Consent: I have reviewed the patients History and Physical, chart, labs and discussed the procedure including the risks, benefits and alternatives for the proposed anesthesia with the patient or authorized representative who has indicated his/her understanding and acceptance.     Dental Advisory Given  Plan Discussed with: CRNA  Anesthesia Plan Comments:         Anesthesia Quick Evaluation

## 2021-10-08 NOTE — Op Note (Signed)
Date of procedure: 10/08/21  Pre-operative diagnosis: Visually significant age-related nuclear cataract, Right Eye; Visually Significant Astigmatism, Right Eye (H25.21)  Post-operative diagnosis: Visually significant age-related cataract, Right Eye; Visually Significant Astigmatism, Right Eye  Procedure: Removal of cataract via phacoemulsification and insertion of intra-ocular lens Wynetta Emery and Johnson DIU150 +21.5D into the capsular bag of the Right Eye  Attending surgeon: Gerda Diss. Ragen Laver, MD, MA  Anesthesia: MAC, Topical Akten  Complications: None  Estimated Blood Loss: <48m (minimal)  Specimens: None  Implants: As above  Indications:  Visually significant age-related cataract, Right Eye; Visually Significant Astigmatism, Right Eye  Procedure:  The patient was seen and identified in the pre-operative area. The operative eye was identified and dilated.  The operative eye was marked.  Pre-operative toric markers were used to mark the eye at 0 and 180 degrees. Topical anesthesia was administered to the operative eye.     The patient was then to the operative suite and placed in the supine position.  A timeout was performed confirming the patient, procedure to be performed, and all other relevant information.   The patient's face was prepped and draped in the usual fashion for intra-ocular surgery.  A lid speculum was placed into the operative eye and the surgical microscope moved into place and focused.  A superotemporal paracentesis was created using a 20 gauge paracentesis blade.  Shugarcaine was injected into the anterior chamber.  Viscoelastic was injected into the anterior chamber.  A temporal clear-corneal main wound incision was created using a 2.4109mmicrokeratome.  A continuous curvilinear capsulorrhexis was initiated using an irrigating cystitome and completed using capsulorrhexis forceps.  Hydrodissection and hydrodeliniation were performed.  Viscoelastic was injected into the  anterior chamber.  A phacoemulsification handpiece and a chopper as a second instrument were used to remove the nucleus and epinucleus. The irrigation/aspiration handpiece was used to remove any remaining cortical material.   The capsular bag was reinflated with viscoelastic, checked, and found to be intact.  The intraocular lens was inserted into the capsular bag and dialed into place using a Kuglen hook to 178 degrees.  The irrigation/aspiration handpiece was used to remove any remaining viscoelastic.  The clear corneal wound and paracentesis wounds were then hydrated and checked with Weck-Cels to be watertight.  The lid-speculum and drape was removed, and the patient's face was cleaned with a wet and dry 4x4.  Maxitrol was instilled in the eye before a clear shield was taped over the eye. The patient was taken to the post-operative care unit in good condition, having tolerated the procedure well.  [facility]-Op Instructions: [facility] and will receive all other orders and instructions.

## 2021-10-08 NOTE — Anesthesia Postprocedure Evaluation (Signed)
Anesthesia Post Note  Patient: Sandra Bradford  Procedure(s) Performed: CATARACT EXTRACTION PHACO AND INTRAOCULAR LENS PLACEMENT (IOC) (Right: Eye)  Patient location during evaluation: Short Stay Anesthesia Type: MAC Level of consciousness: awake and alert Pain management: pain level controlled Vital Signs Assessment: post-procedure vital signs reviewed and stable Respiratory status: spontaneous breathing Cardiovascular status: blood pressure returned to baseline and stable Postop Assessment: no apparent nausea or vomiting Anesthetic complications: no   No notable events documented.   Last Vitals:  Vitals:   10/08/21 0754 10/08/21 0847  BP: (!) 106/52 124/67  Pulse: 73 78  Resp: 15 18  Temp: (!) 36.4 C   SpO2: 98% 98%    Last Pain:  Vitals:   10/08/21 0847  TempSrc:   PainSc: 0-No pain                 Claryce Friel

## 2021-10-14 ENCOUNTER — Encounter (HOSPITAL_COMMUNITY): Payer: Self-pay | Admitting: Ophthalmology

## 2021-10-14 NOTE — Addendum Note (Signed)
Addendum  created 10/14/21 1334 by Karna Dupes, CRNA   Intraprocedure Staff edited

## 2021-10-21 NOTE — H&P (Signed)
Surgical History & Physical  Patient Name: Sandra Bradford DOB: 1940-12-22  Surgery: Cataract extraction with intraocular lens implant phacoemulsification; Left Eye  Surgeon: Baruch Goldmann MD Surgery Date:  11-01-21 Pre-Op Date:  10-14-21  HPI: A 10 Yr. old female patient 1. The patient is returning after cataract surgery. The right eye is affected. Status post cataract surgery, which began 1 ago: Since the last visit, the affected area is doing well. The patient's vision is improved. Patient is following medication instructions. 2. 2. The patient is returning for a cataract follow-up of the left eye. Since the last visit, the affected area is worsening. The patient's vision is blurry. The complaint is associated with difficulty recognizing people at a distance, driving at night due to glare/halos, and difficulty reading traffic signs. This is negatively affecting the patient's quality of life and the patient is unable to function adequately in life with the current level of vision. HPI was performed by Baruch Goldmann .  Medical History: Cataracts Glaucoma Arthritis Heart Problem Kidney Dis LDL  Review of Systems Negative Allergic/Immunologic Negative Cardiovascular Negative Constitutional Negative Ear, Nose, Mouth & Throat Negative Endocrine Negative Eyes Negative Gastrointestinal Negative Genitourinary Negative Hemotologic/Lymphatic Negative Integumentary Negative Musculoskeletal Negative Neurological Negative Psychiatry Negative Respiratory  Social   Never smoked  Medication Prednisolone-Moxifloxacin-Bromfenac,  Allopurinol, Repatha,   Sx/Procedures Phaco c IOL OD-Toric,  Bypass Surgery (5X), Gallbladder Sx,   Drug Allergies  Codeine, Statin drugs,   History & Physical: Heent: Cataract, Left eye NECK: supple without bruits LUNGS: lungs clear to auscultation CV: regular rate and rhythm Abdomen: soft and non-tender Impression & Plan: Assessment: 1.  CATARACT  EXTRACTION STATUS; Right Eye (Z98.41) 2.  COMBINED FORMS AGE RELATED CATARACT; Left Eye (H25.812) 3.  ASTIGMATISM, REGULAR; Both Eyes (H52.223)  Plan: 1.  1 week after cataract surgery. Doing well with improved vision and normal eye pressure. Call with any problems or concerns. Continue Pred-Moxi-Brom 2x/day for 3 more weeks.  2.  Dilates poorly - shugarcaine by protocol. Omidira. Toric Lens. Cataract accounts for the patient's decreased vision. This visual impairment is not correctable with a tolerable change in glasses or contact lenses. Cataract surgery with an implantation of a new lens should significantly improve the visual and functional status of the patient. Discussed all risks, benefits, alternatives, and potential complications. Discussed the procedures and recovery. Patient desires to have surgery. A-scan ordered and performed today for intra-ocular lens calculations. The surgery will be performed in order to improve vision for driving, reading, and for eye examinations. Recommend phacoemulsification with intra-ocular lens. Recommend Dextenza for post-operative pain and inflammation. Left Eye. Surgery required to correct imbalance of vision.  3.  Toric IOL.

## 2021-10-25 ENCOUNTER — Encounter (HOSPITAL_COMMUNITY)
Admission: RE | Admit: 2021-10-25 | Discharge: 2021-10-25 | Disposition: A | Discharge status: Home / Self Care | Payer: PPO | Source: Ambulatory Visit | Attending: Ophthalmology | Admitting: Ophthalmology

## 2021-10-29 ENCOUNTER — Other Ambulatory Visit: Payer: Self-pay | Admitting: Cardiovascular Disease

## 2021-11-04 ENCOUNTER — Other Ambulatory Visit: Payer: Self-pay | Admitting: Hematology

## 2021-11-04 ENCOUNTER — Ambulatory Visit
Admission: RE | Admit: 2021-11-04 | Discharge: 2021-11-04 | Disposition: A | Discharge status: Home / Self Care | Payer: PPO | Source: Ambulatory Visit | Attending: Hematology | Admitting: Hematology

## 2021-11-04 DIAGNOSIS — R921 Mammographic calcification found on diagnostic imaging of breast: Secondary | ICD-10-CM

## 2021-11-04 DIAGNOSIS — Z170 Estrogen receptor positive status [ER+]: Secondary | ICD-10-CM

## 2021-11-18 ENCOUNTER — Telehealth: Payer: Self-pay | Admitting: Nurse Practitioner

## 2021-11-18 ENCOUNTER — Telehealth: Payer: Self-pay

## 2021-11-18 ENCOUNTER — Other Ambulatory Visit: Payer: Self-pay | Admitting: Hematology

## 2021-11-18 DIAGNOSIS — Z170 Estrogen receptor positive status [ER+]: Secondary | ICD-10-CM

## 2021-11-18 NOTE — Telephone Encounter (Signed)
.  Called patient to schedule appointment per 7/27 inbasket, patient is aware of date and time.   

## 2021-11-18 NOTE — Telephone Encounter (Signed)
Refill request sent into Dr. Ernestina Penna office for Tamoxifen.  Pt was supposed to had a f/u visit with Dr. Burr Medico in 06/2021 but was a No Show.  Refilled for ONLY a 1 month supply until the pt has a f/u appt with Dr. Burr Medico or Cira Rue, NP.  Sent a scheduling message to get the pt scheduled with Cira Rue, NP for f/u appt.  Pt normally would get 5 refills but since she has not been seen by one of our provider only a 1 month supply was refilled.

## 2021-11-22 DIAGNOSIS — N1831 Chronic kidney disease, stage 3a: Secondary | ICD-10-CM | POA: Diagnosis not present

## 2021-11-22 DIAGNOSIS — I129 Hypertensive chronic kidney disease with stage 1 through stage 4 chronic kidney disease, or unspecified chronic kidney disease: Secondary | ICD-10-CM | POA: Diagnosis not present
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
## 2021-12-01 ENCOUNTER — Inpatient Hospital Stay: Payer: PPO

## 2021-12-01 ENCOUNTER — Encounter: Payer: Self-pay | Admitting: Nurse Practitioner

## 2021-12-01 ENCOUNTER — Inpatient Hospital Stay: Payer: PPO | Attending: Nurse Practitioner | Admitting: Nurse Practitioner

## 2021-12-01 ENCOUNTER — Other Ambulatory Visit: Payer: Self-pay

## 2021-12-01 VITALS — BP 147/95 | HR 88 | Temp 97.6°F | Wt 146.8 lb

## 2021-12-01 DIAGNOSIS — D649 Anemia, unspecified: Secondary | ICD-10-CM | POA: Diagnosis not present

## 2021-12-01 DIAGNOSIS — Z86718 Personal history of other venous thrombosis and embolism: Secondary | ICD-10-CM | POA: Diagnosis not present

## 2021-12-01 DIAGNOSIS — Z170 Estrogen receptor positive status [ER+]: Secondary | ICD-10-CM

## 2021-12-01 DIAGNOSIS — C50411 Malignant neoplasm of upper-outer quadrant of right female breast: Secondary | ICD-10-CM | POA: Diagnosis not present

## 2021-12-01 DIAGNOSIS — Z79810 Long term (current) use of selective estrogen receptor modulators (SERMs): Secondary | ICD-10-CM | POA: Diagnosis not present

## 2021-12-01 DIAGNOSIS — N183 Chronic kidney disease, stage 3 unspecified: Secondary | ICD-10-CM | POA: Insufficient documentation

## 2021-12-01 DIAGNOSIS — Z7901 Long term (current) use of anticoagulants: Secondary | ICD-10-CM | POA: Insufficient documentation

## 2021-12-01 DIAGNOSIS — I4891 Unspecified atrial fibrillation: Secondary | ICD-10-CM | POA: Diagnosis not present

## 2021-12-01 DIAGNOSIS — I129 Hypertensive chronic kidney disease with stage 1 through stage 4 chronic kidney disease, or unspecified chronic kidney disease: Secondary | ICD-10-CM | POA: Insufficient documentation

## 2021-12-01 LAB — CBC WITH DIFFERENTIAL (CANCER CENTER ONLY)
Abs Immature Granulocytes: 0.02 10*3/uL (ref 0.00–0.07)
Basophils Absolute: 0 10*3/uL (ref 0.0–0.1)
Basophils Relative: 1 %
Eosinophils Absolute: 0.1 10*3/uL (ref 0.0–0.5)
Eosinophils Relative: 2 %
HCT: 33.2 % — ABNORMAL LOW (ref 36.0–46.0)
Hemoglobin: 11.4 g/dL — ABNORMAL LOW (ref 12.0–15.0)
Immature Granulocytes: 0 %
Lymphocytes Relative: 24 %
Lymphs Abs: 1.6 10*3/uL (ref 0.7–4.0)
MCH: 32.5 pg (ref 26.0–34.0)
MCHC: 34.3 g/dL (ref 30.0–36.0)
MCV: 94.6 fL (ref 80.0–100.0)
Monocytes Absolute: 0.6 10*3/uL (ref 0.1–1.0)
Monocytes Relative: 8 %
Neutro Abs: 4.4 10*3/uL (ref 1.7–7.7)
Neutrophils Relative %: 65 %
Platelet Count: 145 10*3/uL — ABNORMAL LOW (ref 150–400)
RBC: 3.51 MIL/uL — ABNORMAL LOW (ref 3.87–5.11)
RDW: 14.6 % (ref 11.5–15.5)
WBC Count: 6.7 10*3/uL (ref 4.0–10.5)
nRBC: 0 % (ref 0.0–0.2)

## 2021-12-01 LAB — CMP (CANCER CENTER ONLY)
ALT: 11 U/L (ref 0–44)
AST: 23 U/L (ref 15–41)
Albumin: 3.7 g/dL (ref 3.5–5.0)
Alkaline Phosphatase: 44 U/L (ref 38–126)
Anion gap: 5 (ref 5–15)
BUN: 30 mg/dL — ABNORMAL HIGH (ref 8–23)
CO2: 29 mmol/L (ref 22–32)
Calcium: 9 mg/dL (ref 8.9–10.3)
Chloride: 106 mmol/L (ref 98–111)
Creatinine: 1.21 mg/dL — ABNORMAL HIGH (ref 0.44–1.00)
GFR, Estimated: 44 mL/min — ABNORMAL LOW (ref >60)
Glucose, Bld: 105 mg/dL — ABNORMAL HIGH (ref 70–99)
Potassium: 3.9 mmol/L (ref 3.5–5.1)
Sodium: 140 mmol/L (ref 135–145)
Total Bilirubin: 0.6 mg/dL (ref 0.3–1.2)
Total Protein: 7 g/dL (ref 6.5–8.1)

## 2021-12-01 MED ORDER — TAMOXIFEN CITRATE 20 MG PO TABS: 20.0000 mg | ORAL_TABLET | Freq: Every morning | ORAL | 3 refills | Status: DC

## 2021-12-02 ENCOUNTER — Telehealth: Payer: Self-pay | Admitting: Nurse Practitioner

## 2021-12-02 NOTE — Telephone Encounter (Signed)
Scheduled follow-up appointment per 8/9 los. Patient is aware. 

## 2021-12-13 DIAGNOSIS — H25812 Combined forms of age-related cataract, left eye: Secondary | ICD-10-CM | POA: Diagnosis not present

## 2021-12-14 ENCOUNTER — Other Ambulatory Visit: Payer: Self-pay

## 2021-12-14 ENCOUNTER — Encounter (HOSPITAL_COMMUNITY): Payer: Self-pay

## 2021-12-14 ENCOUNTER — Encounter (HOSPITAL_COMMUNITY)
Admission: RE | Admit: 2021-12-14 | Discharge: 2021-12-14 | Disposition: A | Discharge status: Home / Self Care | Payer: PPO | Source: Ambulatory Visit | Attending: Ophthalmology | Admitting: Ophthalmology

## 2021-12-17 NOTE — H&P (Signed)
Surgical History & Physical  Patient Name: Sandra Bradford DOB: 1940-12-31  Surgery: Cataract extraction with intraocular lens implant phacoemulsification; Left Eye  Surgeon: Baruch Goldmann MD Surgery Date:  12-20-21 Pre-Op Date:  12-02-21  HPI: A 40 Yr. old female patient 1. The patient is returning after cataract surgery. The right eye is affected. Status post cataract surgery, which began 1 ago: Since the last visit, the affected area is doing well. The patient's vision is improved. Patient is following medication instructions. 2. 2. The patient is returning for a cataract follow-up of the left eye. Since the last visit, the affected area is worsening. The patient's vision is blurry. The complaint is associated with difficulty recognizing people at a distance, driving at night due to glare/halos, and difficulty reading traffic signs. This is negatively affecting the patient's quality of life and the patient is unable to function adequately in life with the current level of vision. HPI was performed by Baruch Goldmann .  Medical History: Cataracts Glaucoma Arthritis Heart Problem Kidney Dis LDL  Review of Systems Negative Allergic/Immunologic Negative Cardiovascular Negative Constitutional Negative Ear, Nose, Mouth & Throat Negative Endocrine Negative Eyes Negative Gastrointestinal Negative Genitourinary Negative Hemotologic/Lymphatic Negative Integumentary Negative Musculoskeletal Negative Neurological Negative Psychiatry Negative Respiratory  Social   Never smoked   Medication Prednisolone-Moxifloxacin-Bromfenac,  Allopurinol, Repatha,   Sx/Procedures Phaco c IOL OD-Toric,  Bypass Surgery (5X), Gallbladder Sx,   Drug Allergies  Codeine, Statin drugs,   History & Physical: Heent: Cataract, left eye NECK: supple without bruits LUNGS: lungs clear to auscultation CV: regular rate and rhythm Abdomen: soft and non-tender Impression & Plan: Assessment: 1.  CATARACT  EXTRACTION STATUS; Right Eye (Z98.41) 2.  COMBINED FORMS AGE RELATED CATARACT; Left Eye (H25.812) 3.  ASTIGMATISM, REGULAR; Both Eyes (H52.223)  Plan: 1.  1 week after cataract surgery. Doing well with improved vision and normal eye pressure. Call with any problems or concerns. Continue Pred-Moxi-Brom 2x/day for 3 more weeks.  2.  Dilates poorly - shugarcaine by protocol. Omidira. Toric Lens. Cataract accounts for the patient's decreased vision. This visual impairment is not correctable with a tolerable change in glasses or contact lenses. Cataract surgery with an implantation of a new lens should significantly improve the visual and functional status of the patient. Discussed all risks, benefits, alternatives, and potential complications. Discussed the procedures and recovery. Patient desires to have surgery. A-scan ordered and performed today for intra-ocular lens calculations. The surgery will be performed in order to improve vision for driving, reading, and for eye examinations. Recommend phacoemulsification with intra-ocular lens. Recommend Dextenza for post-operative pain and inflammation. Left Eye. Surgery required to correct imbalance of vision.  3.  Toric IOL.

## 2021-12-19 IMAGING — MG MM BREAST LOCALIZATION CLIP
4 series · 4 of 12 positions shown · non-contrast
Comparison: Previous exams.

CLINICAL DATA: Patient is post stereotactic core needle biopsy of a
1.7 cm group of indeterminate microcalcifications over the posterior
third of the upper-outer right breast.

EXAM:
DIAGNOSTIC RIGHT MAMMOGRAM POST STEREOTACTIC BIOPSY

[R CC synth-2D]
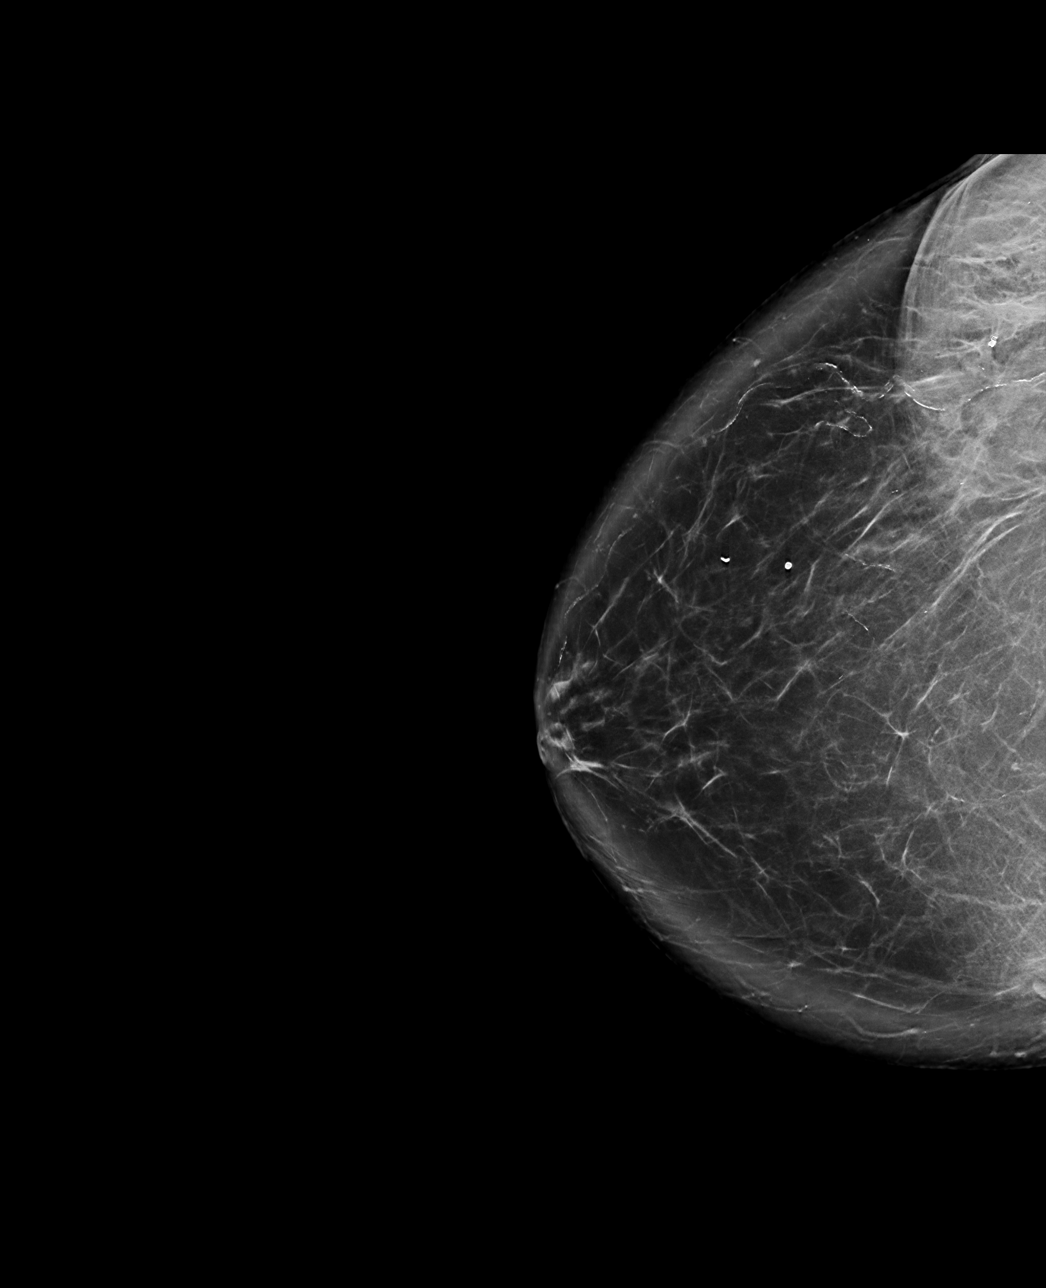

[R LM synth-2D]
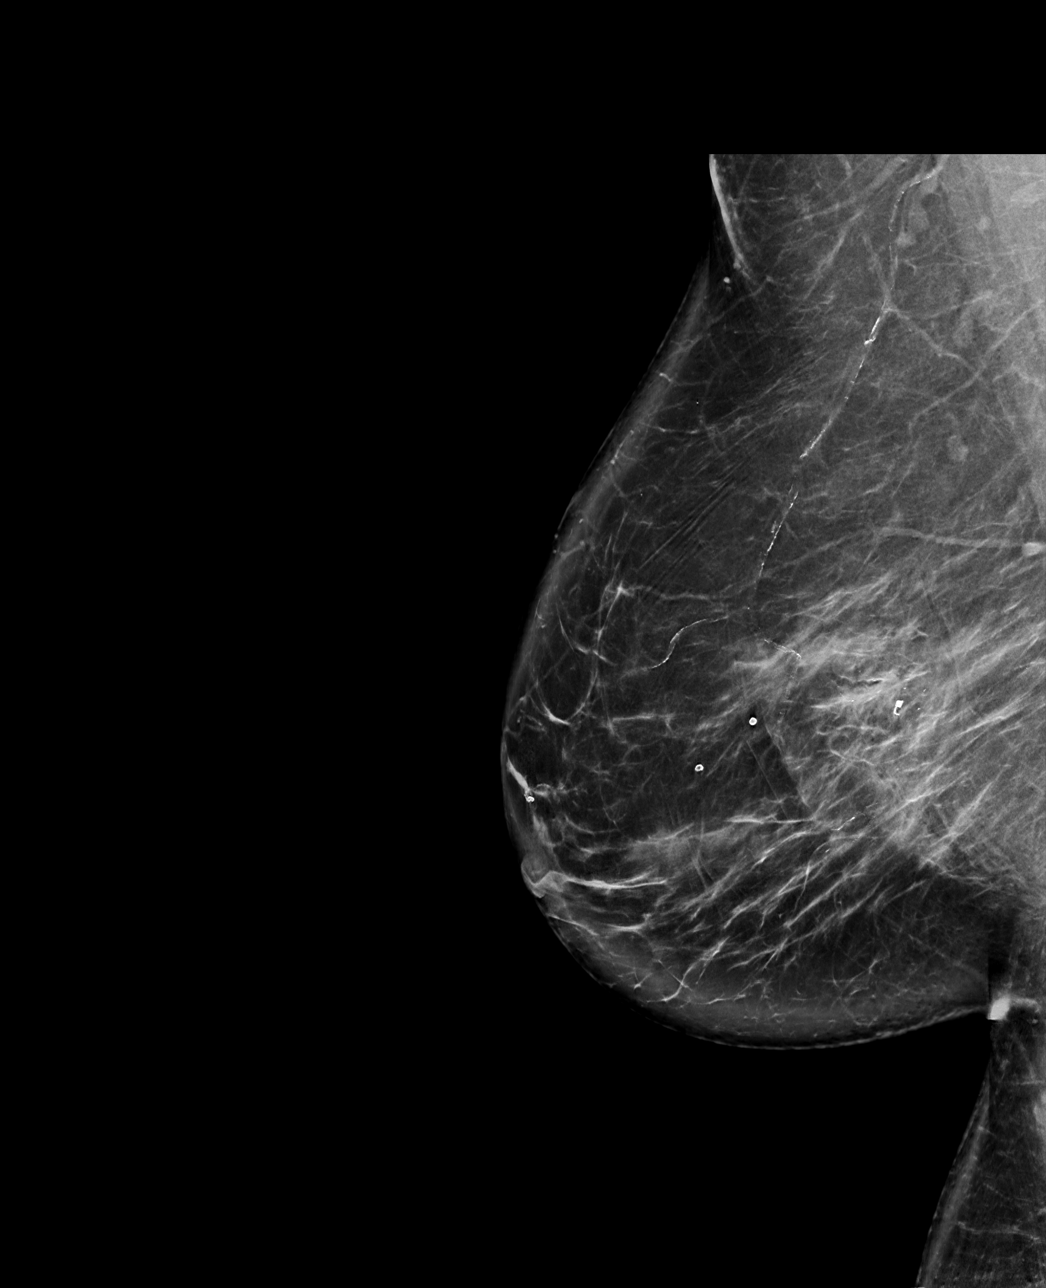

[R CC tomo · tomo slice 47/92.0]
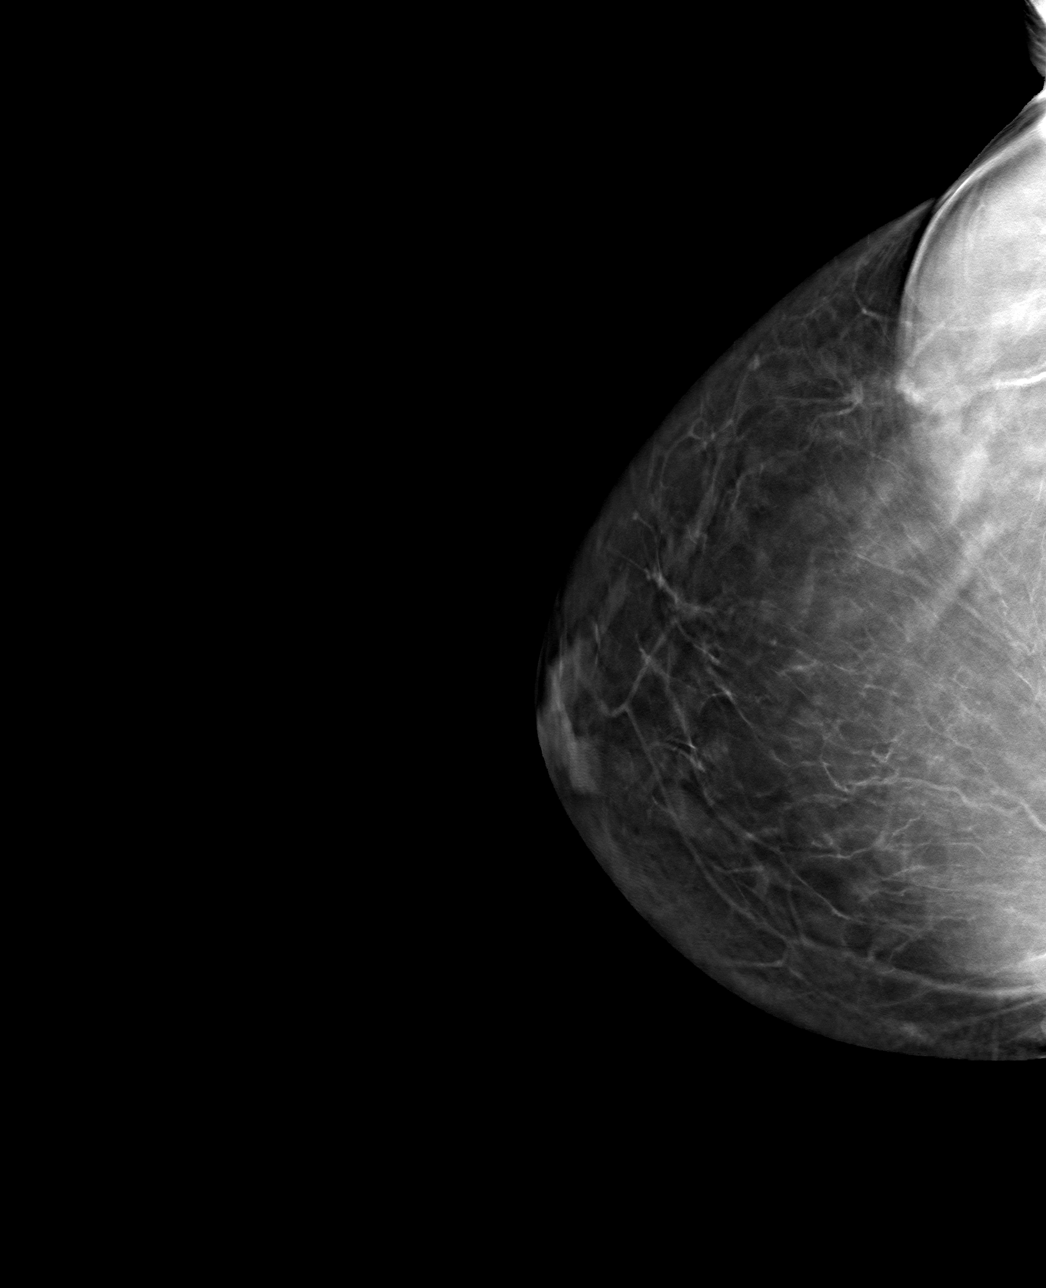

[R LM tomo · tomo slice 47/93.0]
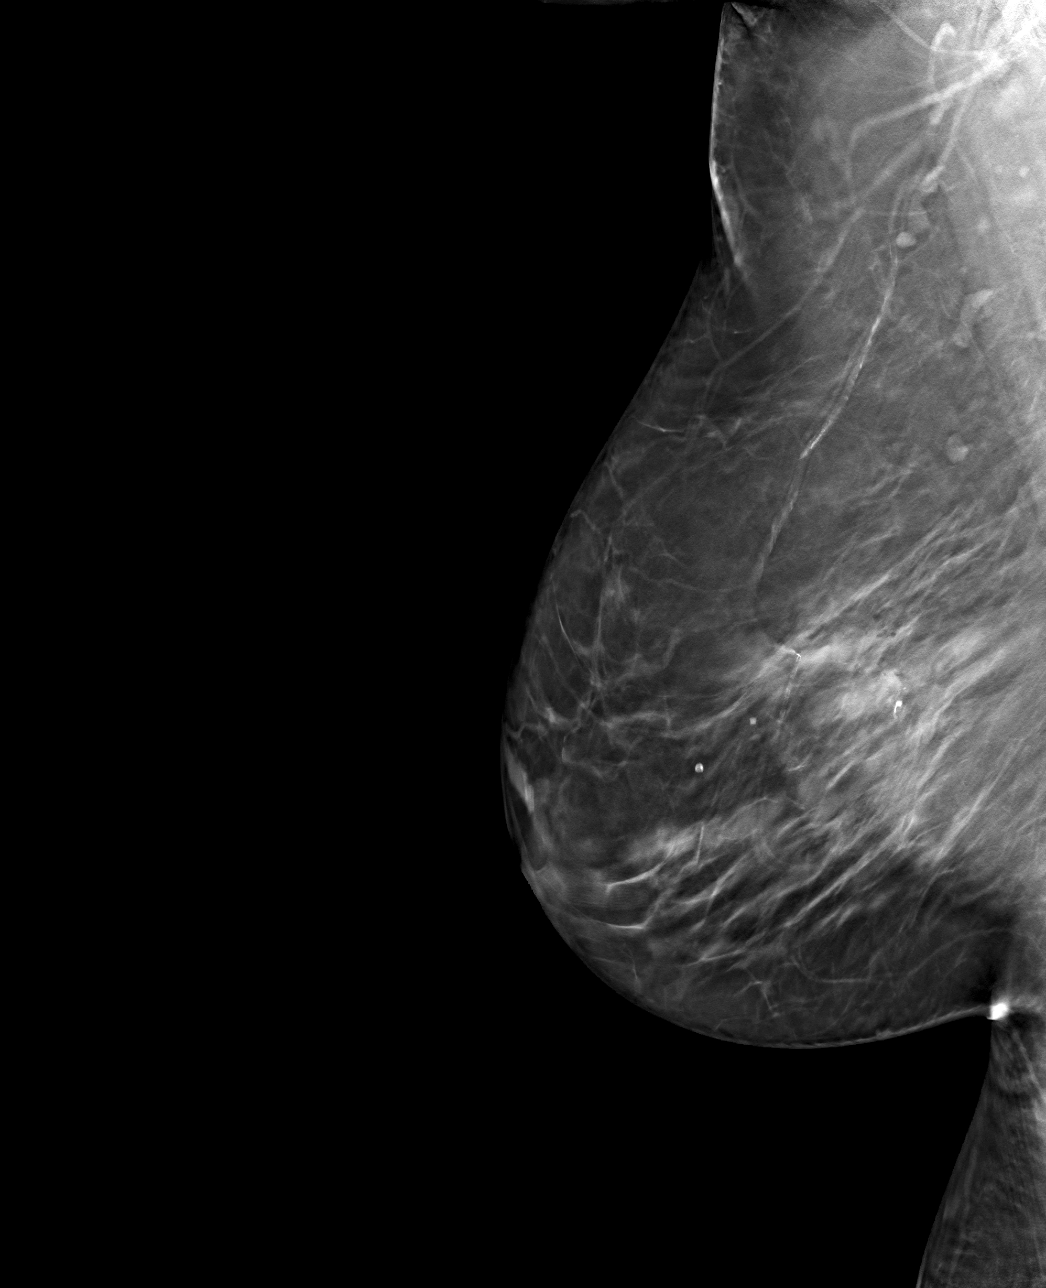

[4 of 12 positions shown; findings below may reference images not displayed]

FINDINGS: Mammographic images were obtained following stereotactic guided
biopsy of the targeted group of microcalcifications over the
posterior upper outer quadrant. The biopsy marking clip is in
expected position at the site of biopsy.
IMPRESSION: Appropriate positioning of the coil shaped biopsy marking clip at
the site of biopsy in the location.

Final Assessment: Post Procedure Mammograms for Marker Placement

## 2021-12-20 ENCOUNTER — Ambulatory Visit (HOSPITAL_COMMUNITY): Payer: PPO | Admitting: Anesthesiology

## 2021-12-20 ENCOUNTER — Encounter (HOSPITAL_COMMUNITY): Payer: Self-pay | Admitting: Ophthalmology

## 2021-12-20 ENCOUNTER — Ambulatory Visit (HOSPITAL_COMMUNITY)
Admission: RE | Admit: 2021-12-20 | Discharge: 2021-12-20 | Disposition: A | Discharge status: Home / Self Care | Payer: PPO | Attending: Ophthalmology | Admitting: Ophthalmology

## 2021-12-20 ENCOUNTER — Encounter (HOSPITAL_COMMUNITY)
Admission: RE | Disposition: A | Discharge status: Home / Self Care | Payer: Self-pay | Source: Home / Self Care | Attending: Ophthalmology

## 2021-12-20 ENCOUNTER — Ambulatory Visit (HOSPITAL_BASED_OUTPATIENT_CLINIC_OR_DEPARTMENT_OTHER): Payer: PPO | Admitting: Anesthesiology

## 2021-12-20 DIAGNOSIS — I2510 Atherosclerotic heart disease of native coronary artery without angina pectoris: Secondary | ICD-10-CM | POA: Diagnosis not present

## 2021-12-20 DIAGNOSIS — I739 Peripheral vascular disease, unspecified: Secondary | ICD-10-CM | POA: Insufficient documentation

## 2021-12-20 DIAGNOSIS — N189 Chronic kidney disease, unspecified: Secondary | ICD-10-CM | POA: Insufficient documentation

## 2021-12-20 DIAGNOSIS — H25812 Combined forms of age-related cataract, left eye: Secondary | ICD-10-CM

## 2021-12-20 DIAGNOSIS — H52202 Unspecified astigmatism, left eye: Secondary | ICD-10-CM

## 2021-12-20 DIAGNOSIS — N183 Chronic kidney disease, stage 3 unspecified: Secondary | ICD-10-CM

## 2021-12-20 DIAGNOSIS — I129 Hypertensive chronic kidney disease with stage 1 through stage 4 chronic kidney disease, or unspecified chronic kidney disease: Secondary | ICD-10-CM | POA: Diagnosis not present

## 2021-12-20 DIAGNOSIS — H52222 Regular astigmatism, left eye: Secondary | ICD-10-CM | POA: Insufficient documentation

## 2021-12-20 HISTORY — PX: CATARACT EXTRACTION W/PHACO: SHX586

## 2021-12-20 SURGERY — PHACOEMULSIFICATION, CATARACT, WITH IOL INSERTION
Anesthesia: Monitor Anesthesia Care | Site: Eye | Laterality: Left

## 2021-12-20 MED ORDER — EPINEPHRINE PF 1 MG/ML IJ SOLN
INTRAOCULAR | Status: DC | PRN
  Administered 2021-12-20: 500 mL

## 2021-12-20 MED ORDER — TROPICAMIDE 1 % OP SOLN
1.0000 [drp] | OPHTHALMIC | Status: DC | PRN
Start: 2021-12-20 — End: 2021-12-20
  Administered 2021-12-20 (×2): 1 [drp] via OPHTHALMIC

## 2021-12-20 MED ORDER — POVIDONE-IODINE 5 % OP SOLN
OPHTHALMIC | Status: DC | PRN
  Administered 2021-12-20: 1 via OPHTHALMIC

## 2021-12-20 MED ORDER — SODIUM HYALURONATE 23MG/ML IO SOSY
PREFILLED_SYRINGE | INTRAOCULAR | Status: DC | PRN
  Administered 2021-12-20: 0.6 mL via INTRAOCULAR

## 2021-12-20 MED ORDER — BSS IO SOLN
INTRAOCULAR | Status: DC | PRN
  Administered 2021-12-20: 15 mL via INTRAOCULAR

## 2021-12-20 MED ORDER — NEOMYCIN-POLYMYXIN-DEXAMETH 3.5-10000-0.1 OP SUSP
OPHTHALMIC | Status: DC | PRN
  Administered 2021-12-20: 2 [drp] via OPHTHALMIC

## 2021-12-20 MED ORDER — STERILE WATER FOR IRRIGATION IR SOLN
Status: DC | PRN
  Administered 2021-12-20: 500 mL

## 2021-12-20 MED ORDER — SODIUM HYALURONATE 10 MG/ML IO SOLUTION
PREFILLED_SYRINGE | INTRAOCULAR | Status: DC | PRN
  Administered 2021-12-20: 0.85 mL via INTRAOCULAR

## 2021-12-20 MED ORDER — TETRACAINE HCL 0.5 % OP SOLN
1.0000 [drp] | OPHTHALMIC | Status: DC | PRN
Start: 2021-12-20 — End: 2021-12-20
  Administered 2021-12-20 (×2): 1 [drp] via OPHTHALMIC

## 2021-12-20 MED ORDER — PHENYLEPHRINE HCL 2.5 % OP SOLN
1.0000 [drp] | OPHTHALMIC | Status: DC | PRN
Start: 2021-12-20 — End: 2021-12-20
  Administered 2021-12-20 (×2): 1 [drp] via OPHTHALMIC

## 2021-12-20 MED ORDER — LIDOCAINE HCL (PF) 1 % IJ SOLN
INTRAOCULAR | Status: DC | PRN
  Administered 2021-12-20: 1 mL via OPHTHALMIC

## 2021-12-20 MED ORDER — LIDOCAINE HCL 3.5 % OP GEL: 1.0000 | Freq: Once | OPHTHALMIC | Status: DC

## 2021-12-20 SURGICAL SUPPLY — 19 items
CATARACT SUITE SIGHTPATH (MISCELLANEOUS) ×1 IMPLANT
CLOTH BEACON ORANGE TIMEOUT ST (SAFETY) ×1 IMPLANT
EYE SHIELD UNIVERSAL CLEAR (GAUZE/BANDAGES/DRESSINGS) IMPLANT
FEE CATARACT SUITE SIGHTPATH (MISCELLANEOUS) ×1 IMPLANT
GLOVE BIOGEL PI IND STRL 6.5 (GLOVE) IMPLANT
GLOVE BIOGEL PI IND STRL 7.0 (GLOVE) ×2 IMPLANT
GLOVE BIOGEL PI INDICATOR 6.5 (GLOVE) ×1
GLOVE BIOGEL PI INDICATOR 7.0 (GLOVE) ×1
GLOVE ECLIPSE 6.0 STRL STRAW (GLOVE) IMPLANT
LENS IOL EYHANCE TORIC II 21.0 ×1 IMPLANT
LENS IOL EYHANCE TRC 150 21.0 IMPLANT
LENS IOL EYHNC TORIC 150 21.0 ×1 IMPLANT
NDL HYPO 18GX1.5 BLUNT FILL (NEEDLE) IMPLANT
NEEDLE HYPO 18GX1.5 BLUNT FILL (NEEDLE) ×1 IMPLANT
PAD ARMBOARD 7.5X6 YLW CONV (MISCELLANEOUS) ×1 IMPLANT
SYR TB 1ML LL NO SAFETY (SYRINGE) ×1 IMPLANT
TAPE SURG TRANSPORE 1 IN (GAUZE/BANDAGES/DRESSINGS) IMPLANT
TAPE SURGICAL TRANSPORE 1 IN (GAUZE/BANDAGES/DRESSINGS) ×1
WATER STERILE IRR 500ML POUR (IV SOLUTION) IMPLANT

## 2021-12-20 NOTE — Interval H&P Note (Signed)
History and Physical Interval Note:  12/20/2021 11:29 AM  Sandra Bradford  has presented today for surgery, with the diagnosis of Combined forms age related cataract; left.  The various methods of treatment have been discussed with the patient and family. After consideration of risks, benefits and other options for treatment, the patient has consented to  Procedure(s) with comments: CATARACT EXTRACTION PHACO AND INTRAOCULAR LENS PLACEMENT ([place]) (Left) - left as a surgical intervention.  The patient's history has been reviewed, patient examined, no change in status, stable for surgery.  I have reviewed the patient's chart and labs.  Questions were answered to the patient's satisfaction.     Baruch Goldmann

## 2021-12-20 NOTE — Discharge Instructions (Addendum)
Please discharge patient when stable, will follow up today with Dr. Wrzosek at the [facility] office immediately following discharge.  Leave shield in place until visit.  All paperwork with discharge instructions will be given at the office.  [facility] Address:  [address]  [place], [place] [address]  

## 2021-12-20 NOTE — Anesthesia Postprocedure Evaluation (Signed)
Anesthesia Post Note  Patient: Sandra Bradford  Procedure(s) Performed: CATARACT EXTRACTION PHACO AND INTRAOCULAR LENS PLACEMENT ([facility]) (Left: Eye)  Patient location during evaluation: Short Stay Anesthesia Type: MAC Level of consciousness: awake and alert Pain management: pain level controlled Vital Signs Assessment: post-procedure vital signs reviewed and stable Respiratory status: spontaneous breathing Cardiovascular status: blood pressure returned to baseline and stable Postop Assessment: no apparent nausea or vomiting Anesthetic complications: no   No notable events documented.   Last Vitals:  Vitals:   12/20/21 1115 12/20/21 1157  BP: (!) 118/48 (!) 119/59  Pulse: 78 73  Resp: 16 (!) 21  Temp: 36.6 C 36.6 C  SpO2: 99% 100%    Last Pain:  Vitals:   12/20/21 1157  TempSrc: Oral  PainSc: 0-No pain                 Collene Massimino

## 2021-12-20 NOTE — Transfer of Care (Signed)
Immediate Anesthesia Transfer of Care Note  Patient: Sandra Bradford  Procedure(s) Performed: CATARACT EXTRACTION PHACO AND INTRAOCULAR LENS PLACEMENT ([place]) (Left: Eye)  Patient Location: Short Stay  Anesthesia Type:MAC  Level of Consciousness: awake  Airway & Oxygen Therapy: Patient Spontanous Breathing  Post-op Assessment: Report given to RN  Post vital signs: Reviewed and stable  Last Vitals:  Vitals Value Taken Time  BP 119/59 12/20/21 1157  Temp 36.6 C 12/20/21 1157  Pulse 73 12/20/21 1157  Resp 21 12/20/21 1157  SpO2 100 % 12/20/21 1157    Last Pain:  Vitals:   12/20/21 1157  TempSrc: Oral  PainSc: 0-No pain      Patients Stated Pain Goal: 6 (75/88/32 [id number])  Complications: No notable events documented.

## 2021-12-20 NOTE — Anesthesia Preprocedure Evaluation (Signed)
Anesthesia Evaluation  Patient identified by MRN, date of birth, ID band Patient awake    Reviewed: Allergy & Precautions, H&P , NPO status , Patient's Chart, lab work & pertinent test results, reviewed documented beta blocker date and time   History of Anesthesia Complications (+) PONV and history of anesthetic complications  Airway Mallampati: II  TM Distance: >3 FB Neck ROM: full    Dental no notable dental hx.    Pulmonary shortness of breath,    Pulmonary exam normal breath sounds clear to auscultation       Cardiovascular Exercise Tolerance: Good hypertension, + CAD and + Peripheral Vascular Disease   Rhythm:Irregular Rate:Normal  Afib   Neuro/Psych negative neurological ROS  negative psych ROS   GI/Hepatic negative GI ROS, Neg liver ROS,   Endo/Other  negative endocrine ROS  Renal/GU CRFRenal disease  negative genitourinary   Musculoskeletal   Abdominal   Peds  Hematology negative hematology ROS (+)   Anesthesia Other Findings   Reproductive/Obstetrics negative OB ROS                            Anesthesia Physical Anesthesia Plan  ASA: 3  Anesthesia Plan: MAC   Post-op Pain Management:    Induction:   PONV Risk Score and Plan:   Airway Management Planned:   Additional Equipment:   Intra-op Plan:   Post-operative Plan:   Informed Consent: I have reviewed the patients History and Physical, chart, labs and discussed the procedure including the risks, benefits and alternatives for the proposed anesthesia with the patient or authorized representative who has indicated his/her understanding and acceptance.     Dental Advisory Given  Plan Discussed with: CRNA  Anesthesia Plan Comments:         Anesthesia Quick Evaluation  

## 2021-12-20 NOTE — Op Note (Signed)
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 Shell Blanchette, MD, MA  Anesthesia: MAC, Topical [place]  Complications: None  Estimated Blood Loss: <58m (minimal)  Specimens: None  Implants: As above  Indications:  Visually significant age-related cataract, Left Eye; Visually Significant Astigmatism, Left Eye  Procedure:  The patient was seen and identified in the pre-operative area. The operative eye was identified and dilated.  The operative eye was marked.  Pre-operative toric markers were used to mark the eye at 0 and 180 degrees. Topical anesthesia was administered to the operative eye.     The patient was then to the operative suite and placed in the supine position.  A timeout was performed confirming the patient, procedure to be performed, and all other relevant information.   The patient's face was prepped and draped in the usual fashion for intra-ocular surgery.  A lid speculum was placed into the operative eye and the surgical microscope moved into place and focused.  A superotemporal paracentesis was created using a 20 gauge paracentesis blade.  Shugarcaine was injected into the anterior chamber.  Viscoelastic was injected into the anterior chamber.  A temporal clear-corneal main wound incision was created using a 2.469mmicrokeratome.  A continuous curvilinear capsulorrhexis was initiated using an irrigating cystitome and completed using capsulorrhexis forceps.  Hydrodissection and hydrodeliniation were performed.  Viscoelastic was injected into the anterior  chamber.  A phacoemulsification handpiece and a chopper as a second instrument were used to remove the nucleus and epinucleus. The irrigation/aspiration handpiece was used to remove any remaining cortical material.   The capsular bag was reinflated with viscoelastic, checked, and found to be intact.  The eye was marked to the per-op meridian.  The intraocular lens was inserted into the capsular bag and dialed into place using a Kuglen hook to 6 degrees.  The irrigation/aspiration handpiece was used to remove any remaining viscoelastic.  The clear corneal wound and paracentesis wounds were then hydrated and checked with Weck-Cels to be watertight.  The lid-speculum and drape was removed, and the patient's face was cleaned with a wet and dry 4x4.  Maxitrol was instilled in the eye before a clear shield was taped over the eye. The patient was taken to the post-operative care unit in good condition, having tolerated the procedure well.  Post-Op Instructions: [facility] and will receive all other orders and instructions.

## 2021-12-23 ENCOUNTER — Encounter (HOSPITAL_COMMUNITY): Payer: Self-pay | Admitting: Ophthalmology

## 2021-12-23 DIAGNOSIS — N1831 Chronic kidney disease, stage 3a: Secondary | ICD-10-CM | POA: Diagnosis not present

## 2021-12-23 DIAGNOSIS — I129 Hypertensive chronic kidney disease with stage 1 through stage 4 chronic kidney disease, or unspecified chronic kidney disease: Secondary | ICD-10-CM | POA: Diagnosis not present

## 2022-01-16 ENCOUNTER — Other Ambulatory Visit: Payer: Self-pay | Admitting: Cardiovascular Disease

## 2022-01-16 DIAGNOSIS — I4819 Other persistent atrial fibrillation: Secondary | ICD-10-CM

## 2022-01-17 DIAGNOSIS — N1831 Chronic kidney disease, stage 3a: Secondary | ICD-10-CM | POA: Diagnosis not present

## 2022-01-17 DIAGNOSIS — I2510 Atherosclerotic heart disease of native coronary artery without angina pectoris: Secondary | ICD-10-CM | POA: Diagnosis not present

## 2022-01-17 DIAGNOSIS — C50911 Malignant neoplasm of unspecified site of right female breast: Secondary | ICD-10-CM | POA: Diagnosis not present

## 2022-01-17 DIAGNOSIS — I10 Essential (primary) hypertension: Secondary | ICD-10-CM | POA: Diagnosis not present

## 2022-01-17 DIAGNOSIS — I129 Hypertensive chronic kidney disease with stage 1 through stage 4 chronic kidney disease, or unspecified chronic kidney disease: Secondary | ICD-10-CM | POA: Diagnosis not present

## 2022-01-17 DIAGNOSIS — Z6826 Body mass index (BMI) 26.0-26.9, adult: Secondary | ICD-10-CM | POA: Diagnosis not present

## 2022-01-17 DIAGNOSIS — Z23 Encounter for immunization: Secondary | ICD-10-CM | POA: Diagnosis not present

## 2022-01-17 DIAGNOSIS — E119 Type 2 diabetes mellitus without complications: Secondary | ICD-10-CM | POA: Diagnosis not present

## 2022-01-17 DIAGNOSIS — Z0001 Encounter for general adult medical examination with abnormal findings: Secondary | ICD-10-CM | POA: Diagnosis not present

## 2022-01-17 DIAGNOSIS — Z1331 Encounter for screening for depression: Secondary | ICD-10-CM | POA: Diagnosis not present

## 2022-01-17 DIAGNOSIS — E782 Mixed hyperlipidemia: Secondary | ICD-10-CM | POA: Diagnosis not present

## 2022-01-17 DIAGNOSIS — M109 Gout, unspecified: Secondary | ICD-10-CM | POA: Diagnosis not present

## 2022-01-17 IMAGING — MG MM PLC BREAST LOC DEV 1ST LESION INC*R*
8 series · 8 of 8 positions shown · non-contrast
Comparison: Previous exam(s).

CLINICAL DATA: Patient presents for radioactive seed localization
prior to surgical excision of biopsy-proven malignancy over the
outer midportion of the right breast marked by a coil clip.

EXAM:
MAMMOGRAPHIC GUIDED RADIOACTIVE SEED LOCALIZATION OF THE RIGHT
BREAST

[R CC (1 of 5)]
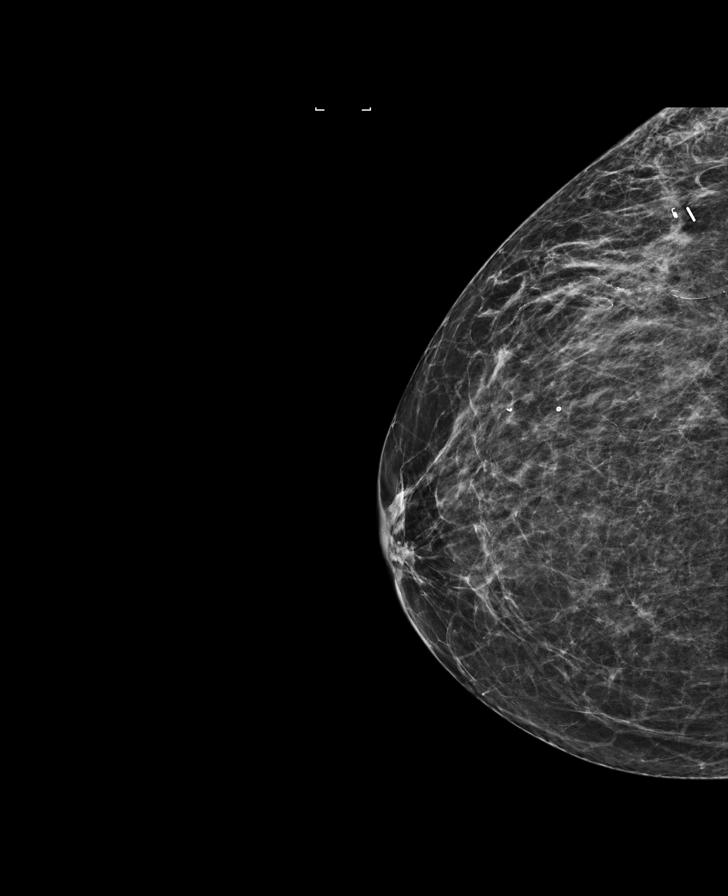

[R CC (2 of 5)]
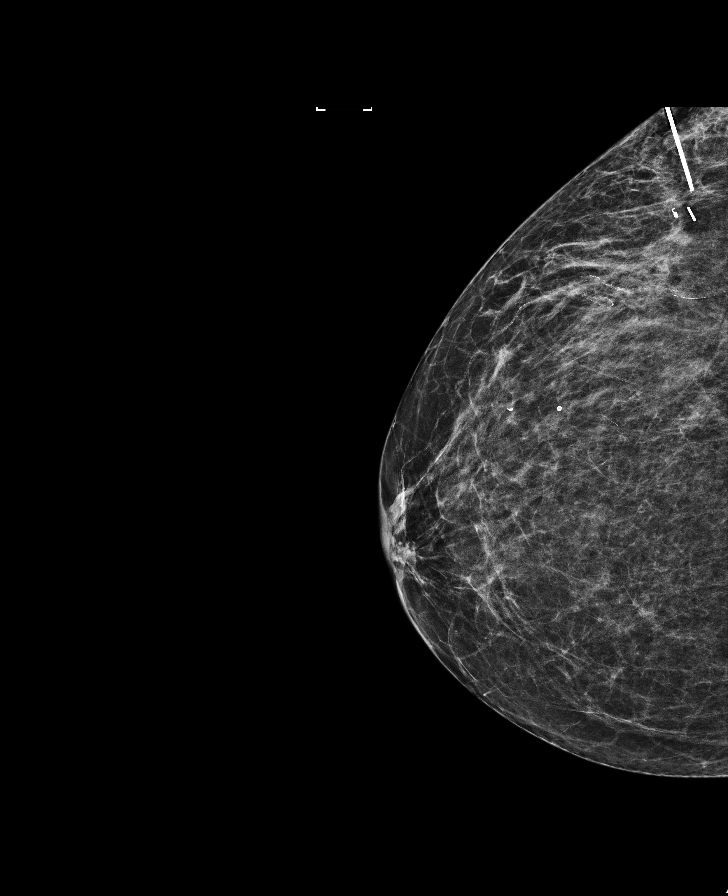

[R LM (1 of 3)]
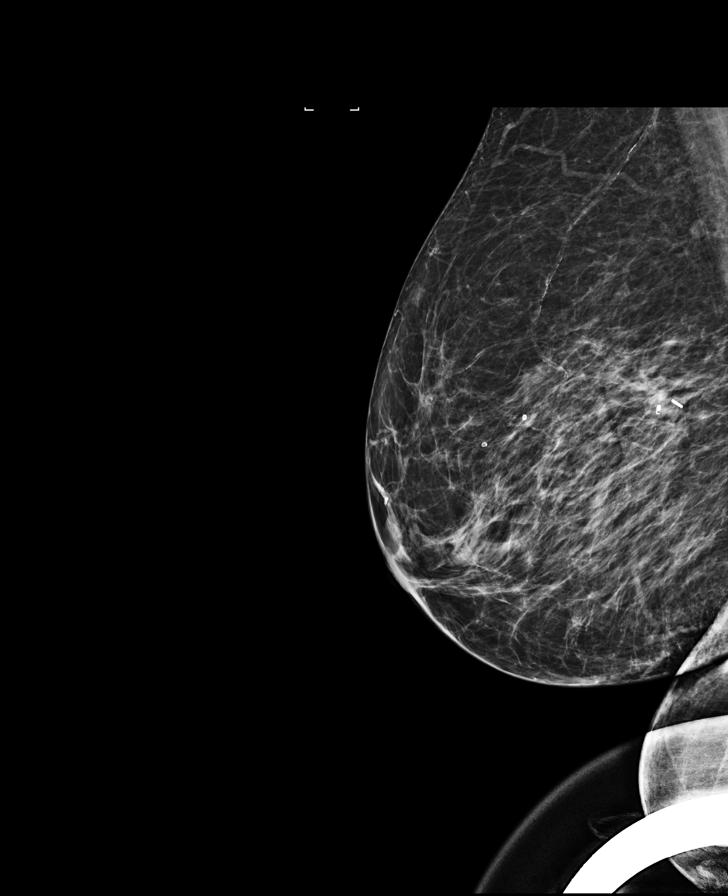

[R CC (3 of 5)]
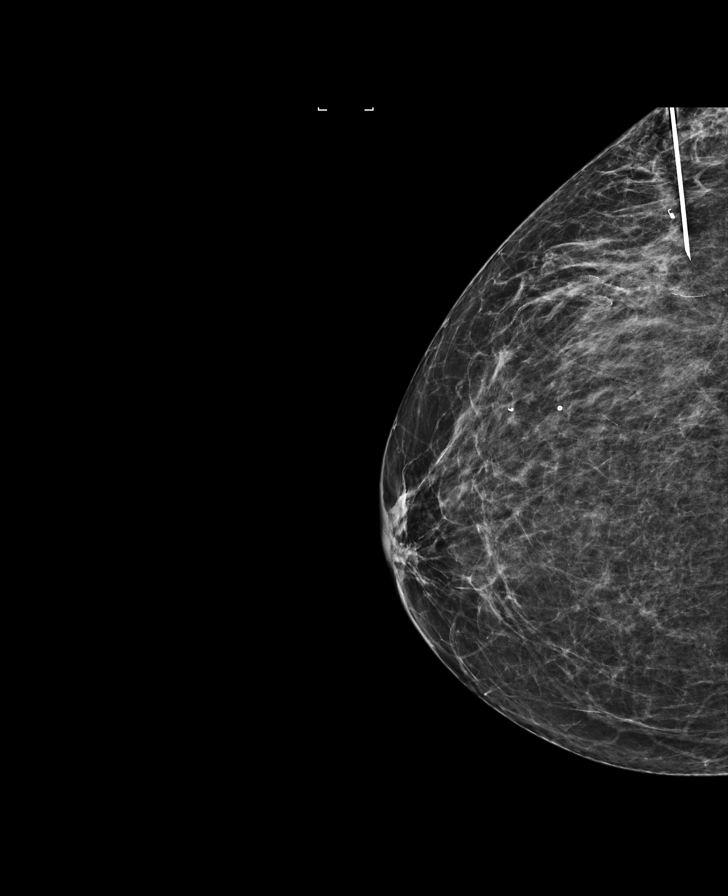

[R LM (2 of 3)]
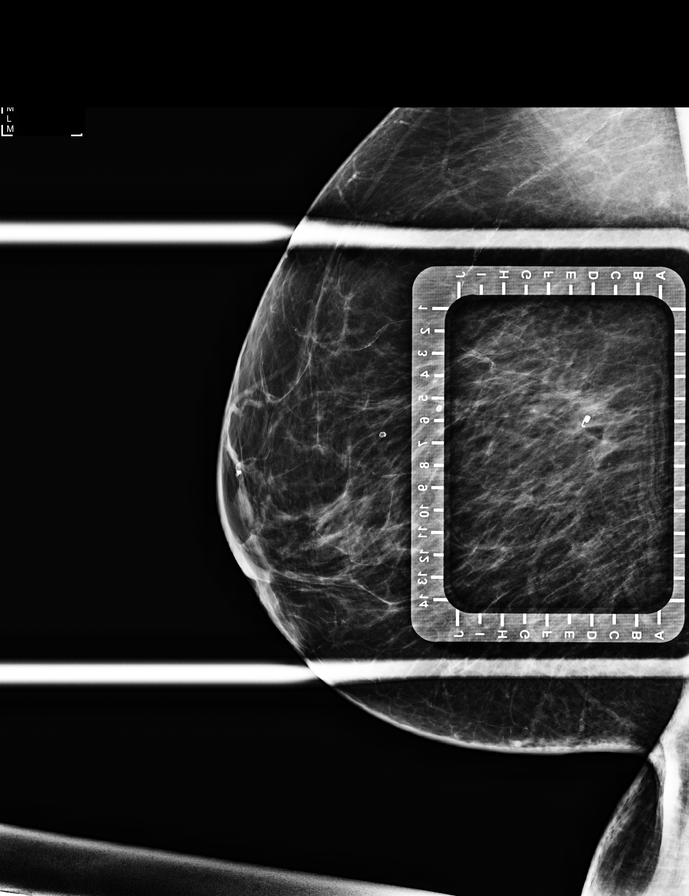

[R CC (4 of 5)]
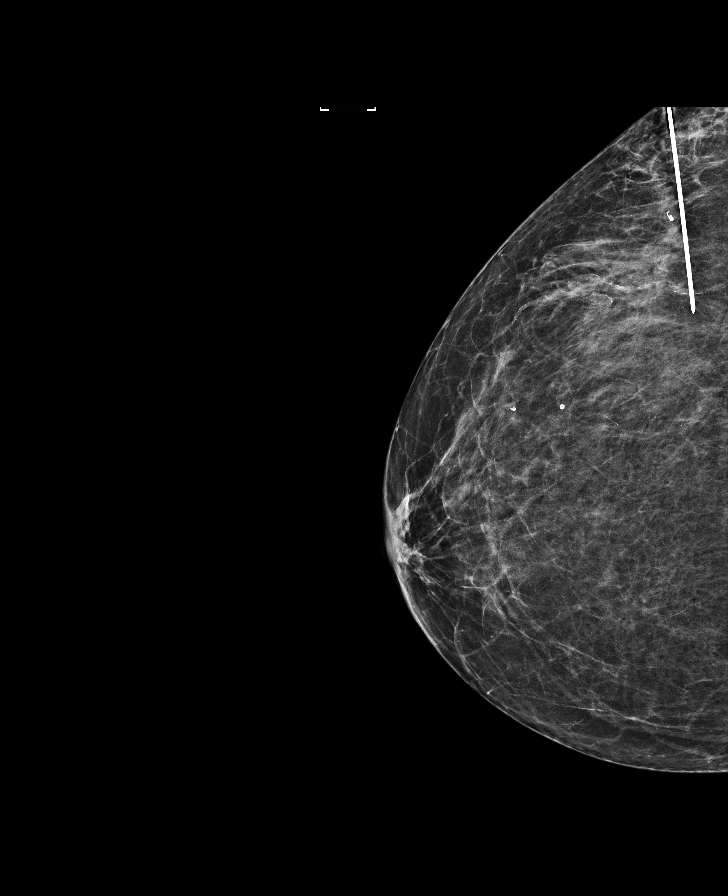

[R CC (5 of 5)]
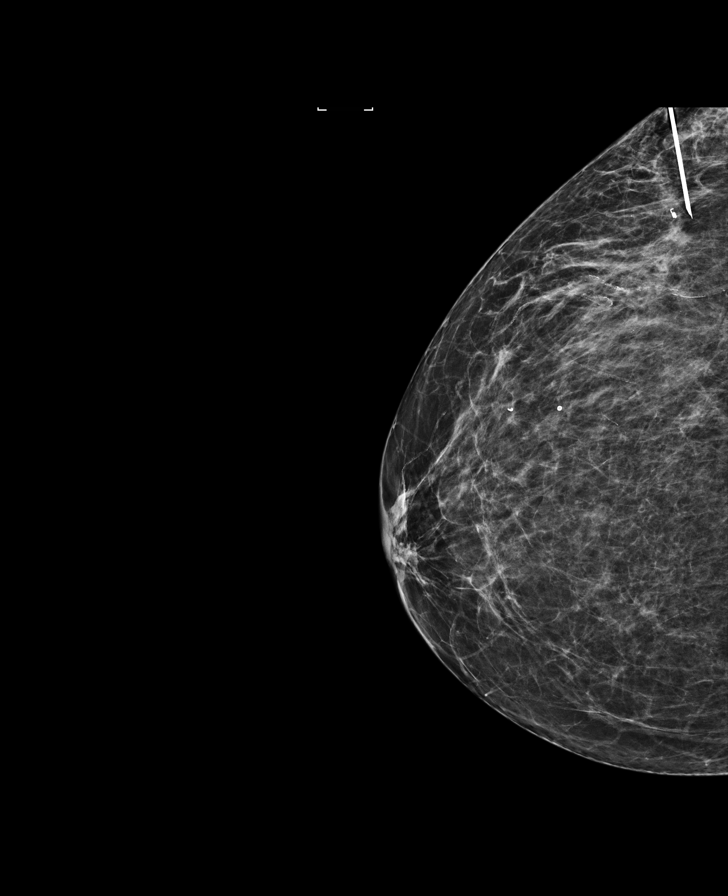

[R LM (3 of 3)]
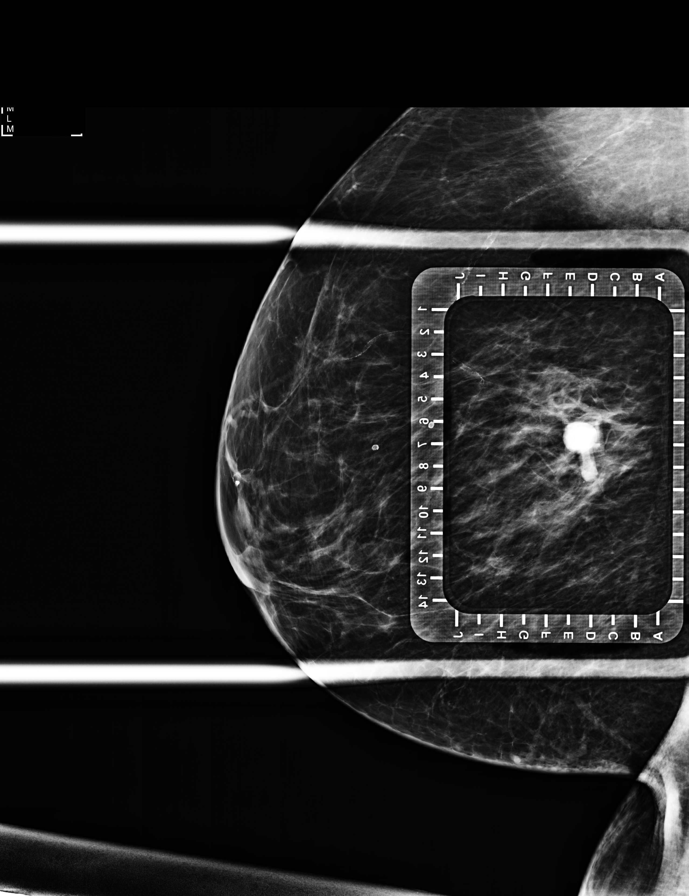

[8 of 8 positions shown; findings below may reference images not displayed]

FINDINGS: Patient presents for radioactive seed localization prior to surgical
excision. I met with the patient and we discussed the procedure of
seed localization including benefits and alternatives. We discussed
the high likelihood of a successful procedure. We discussed the
risks of the procedure including infection, bleeding, tissue injury
and further surgery. We discussed the low dose of radioactivity
involved in the procedure. Informed, written consent was given.

The usual time-out protocol was performed immediately prior to the
procedure.

Using mammographic guidance, sterile technique, 1% lidocaine and an
E-1BY radioactive seed, the targeted coil clip was localized using a
lateral to medial approach. The follow-up mammogram images confirm
the seed in the expected location and were marked for Dr. Nagorao. The
seed lies 4 mm posterior to the clip on both images. Follow-up
survey of the patient confirms presence of the radioactive seed.

Order number of [id number] seed:  [id number].

Total activity:  0.247 millicurie reference Date: 09/19/2019

The patient tolerated the procedure well and was released from the
[REDACTED]. She was given instructions regarding seed removal.
IMPRESSION: Radioactive seed localization right breast. No apparent
complications.

## 2022-01-17 NOTE — Telephone Encounter (Signed)
Prescription refill request for Xarelto received.  Indication:Afib    Last office visit:05/20/21 Quentin Ore)  Weight: 65.8kg Age:81 Scr: 1.21 (12/01/21)  CrCl: 36.63ml/min  Appropriate dose and refill sent to requested pharmacy.

## 2022-01-18 IMAGING — MG MM BREAST SURGICAL SPECIMEN
1 series · 1 of 1 positions shown · non-contrast
Comparison: Previous exam(s).

CLINICAL DATA: Specimen mammogram, status post surgical excision of
a right breast lesion following radioactive seed localization.

EXAM:
SPECIMEN RADIOGRAPH OF THE RIGHT BREAST

[R]
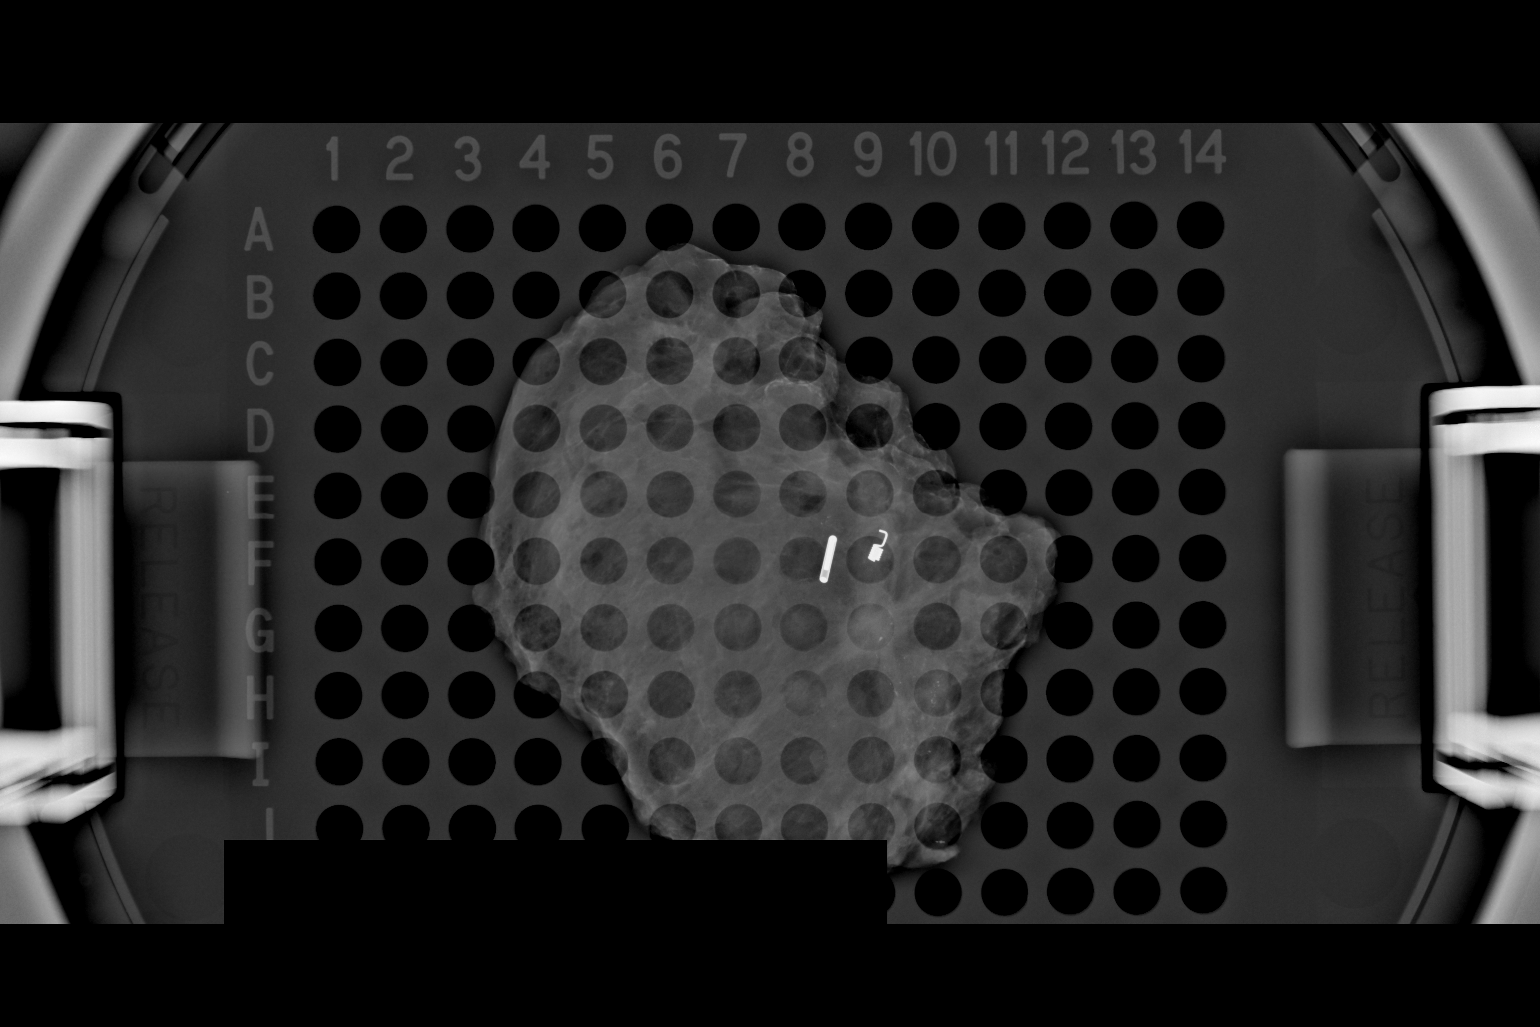

[1 of 1 positions shown; findings below may reference images not displayed]

FINDINGS: Status post excision of the right breast. The radioactive seed and
biopsy marker clip are present, completely intact, and were marked
for pathology.
IMPRESSION: Specimen radiograph of the right breast.

## 2022-01-20 DIAGNOSIS — Z0001 Encounter for general adult medical examination with abnormal findings: Secondary | ICD-10-CM | POA: Diagnosis not present

## 2022-01-20 DIAGNOSIS — E7849 Other hyperlipidemia: Secondary | ICD-10-CM | POA: Diagnosis not present

## 2022-01-20 DIAGNOSIS — I2510 Atherosclerotic heart disease of native coronary artery without angina pectoris: Secondary | ICD-10-CM | POA: Diagnosis not present

## 2022-01-20 DIAGNOSIS — I129 Hypertensive chronic kidney disease with stage 1 through stage 4 chronic kidney disease, or unspecified chronic kidney disease: Secondary | ICD-10-CM | POA: Diagnosis not present

## 2022-02-22 DIAGNOSIS — N1831 Chronic kidney disease, stage 3a: Secondary | ICD-10-CM | POA: Diagnosis not present

## 2022-02-22 DIAGNOSIS — I129 Hypertensive chronic kidney disease with stage 1 through stage 4 chronic kidney disease, or unspecified chronic kidney disease: Secondary | ICD-10-CM | POA: Diagnosis not present

## 2022-05-02 DIAGNOSIS — N390 Urinary tract infection, site not specified: Secondary | ICD-10-CM | POA: Diagnosis not present

## 2022-05-02 DIAGNOSIS — M109 Gout, unspecified: Secondary | ICD-10-CM | POA: Diagnosis not present

## 2022-05-02 DIAGNOSIS — N183 Chronic kidney disease, stage 3 unspecified: Secondary | ICD-10-CM | POA: Diagnosis not present

## 2022-05-02 DIAGNOSIS — I129 Hypertensive chronic kidney disease with stage 1 through stage 4 chronic kidney disease, or unspecified chronic kidney disease: Secondary | ICD-10-CM | POA: Diagnosis not present

## 2022-05-02 DIAGNOSIS — Z6835 Body mass index (BMI) 35.0-35.9, adult: Secondary | ICD-10-CM | POA: Diagnosis not present

## 2022-05-12 ENCOUNTER — Ambulatory Visit
Admission: RE | Admit: 2022-05-12 | Discharge: 2022-05-12 | Disposition: A | Discharge status: Home / Self Care | Payer: PPO | Source: Ambulatory Visit | Attending: Hematology | Admitting: Hematology

## 2022-05-12 DIAGNOSIS — C50411 Malignant neoplasm of upper-outer quadrant of right female breast: Secondary | ICD-10-CM

## 2022-05-12 DIAGNOSIS — Z853 Personal history of malignant neoplasm of breast: Secondary | ICD-10-CM | POA: Diagnosis not present

## 2022-05-12 DIAGNOSIS — R921 Mammographic calcification found on diagnostic imaging of breast: Secondary | ICD-10-CM

## 2022-05-16 ENCOUNTER — Ambulatory Visit: Payer: PPO | Admitting: Cardiovascular Disease

## 2022-05-16 ENCOUNTER — Other Ambulatory Visit: Payer: Self-pay | Admitting: Cardiovascular Disease

## 2022-06-02 ENCOUNTER — Telehealth: Payer: Self-pay | Admitting: Hematology
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
## 2022-06-02 NOTE — Telephone Encounter (Signed)
Contacted patient to scheduled appointments. Left message with appointment details and a call back number if patient had any questions or could not accommodate the time we provided.   

## 2022-06-03 ENCOUNTER — Inpatient Hospital Stay: Payer: PPO | Attending: Hematology | Admitting: Physician Assistant

## 2022-06-03 ENCOUNTER — Inpatient Hospital Stay: Payer: PPO

## 2022-06-03 VITALS — BP 144/70 | HR 67 | Temp 98.0°F | Resp 15 | Wt 145.2 lb

## 2022-06-03 DIAGNOSIS — Z9049 Acquired absence of other specified parts of digestive tract: Secondary | ICD-10-CM | POA: Diagnosis not present

## 2022-06-03 DIAGNOSIS — Z8719 Personal history of other diseases of the digestive system: Secondary | ICD-10-CM | POA: Diagnosis not present

## 2022-06-03 DIAGNOSIS — Z170 Estrogen receptor positive status [ER+]: Secondary | ICD-10-CM | POA: Diagnosis not present

## 2022-06-03 DIAGNOSIS — Z86718 Personal history of other venous thrombosis and embolism: Secondary | ICD-10-CM | POA: Diagnosis not present

## 2022-06-03 DIAGNOSIS — Z79810 Long term (current) use of selective estrogen receptor modulators (SERMs): Secondary | ICD-10-CM | POA: Insufficient documentation

## 2022-06-03 DIAGNOSIS — K649 Unspecified hemorrhoids: Secondary | ICD-10-CM | POA: Diagnosis not present

## 2022-06-03 DIAGNOSIS — Z90710 Acquired absence of both cervix and uterus: Secondary | ICD-10-CM | POA: Insufficient documentation

## 2022-06-03 DIAGNOSIS — I4891 Unspecified atrial fibrillation: Secondary | ICD-10-CM | POA: Insufficient documentation

## 2022-06-03 DIAGNOSIS — R197 Diarrhea, unspecified: Secondary | ICD-10-CM | POA: Diagnosis not present

## 2022-06-03 DIAGNOSIS — I10 Essential (primary) hypertension: Secondary | ICD-10-CM | POA: Diagnosis not present

## 2022-06-03 DIAGNOSIS — C50411 Malignant neoplasm of upper-outer quadrant of right female breast: Secondary | ICD-10-CM | POA: Insufficient documentation

## 2022-06-03 DIAGNOSIS — Z85819 Personal history of malignant neoplasm of unspecified site of lip, oral cavity, and pharynx: Secondary | ICD-10-CM | POA: Diagnosis not present

## 2022-06-03 DIAGNOSIS — Z79899 Other long term (current) drug therapy: Secondary | ICD-10-CM | POA: Diagnosis not present

## 2022-06-03 DIAGNOSIS — D649 Anemia, unspecified: Secondary | ICD-10-CM | POA: Insufficient documentation

## 2022-06-03 DIAGNOSIS — Z885 Allergy status to narcotic agent status: Secondary | ICD-10-CM | POA: Insufficient documentation

## 2022-06-03 DIAGNOSIS — Z888 Allergy status to other drugs, medicaments and biological substances status: Secondary | ICD-10-CM | POA: Diagnosis not present

## 2022-06-03 DIAGNOSIS — Z7901 Long term (current) use of anticoagulants: Secondary | ICD-10-CM | POA: Diagnosis not present

## 2022-06-03 LAB — CMP (CANCER CENTER ONLY)
ALT: 12 U/L (ref 0–44)
AST: 25 U/L (ref 15–41)
Albumin: 3.6 g/dL (ref 3.5–5.0)
Alkaline Phosphatase: 54 U/L (ref 38–126)
Anion gap: 7 (ref 5–15)
BUN: 37 mg/dL — ABNORMAL HIGH (ref 8–23)
CO2: 30 mmol/L (ref 22–32)
Calcium: 9.1 mg/dL (ref 8.9–10.3)
Chloride: 106 mmol/L (ref 98–111)
Creatinine: 1.41 mg/dL — ABNORMAL HIGH (ref 0.44–1.00)
GFR, Estimated: 37 mL/min — ABNORMAL LOW (ref >60)
Glucose, Bld: 118 mg/dL — ABNORMAL HIGH (ref 70–99)
Potassium: 3.7 mmol/L (ref 3.5–5.1)
Sodium: 143 mmol/L (ref 135–145)
Total Bilirubin: 0.5 mg/dL (ref 0.3–1.2)
Total Protein: 6.9 g/dL (ref 6.5–8.1)

## 2022-06-03 LAB — CBC WITH DIFFERENTIAL (CANCER CENTER ONLY)
Abs Immature Granulocytes: 0.02 10*3/uL (ref 0.00–0.07)
Basophils Absolute: 0 10*3/uL (ref 0.0–0.1)
Basophils Relative: 0 %
Eosinophils Absolute: 0.1 10*3/uL (ref 0.0–0.5)
Eosinophils Relative: 1 %
HCT: 36.5 % (ref 36.0–46.0)
Hemoglobin: 12.1 g/dL (ref 12.0–15.0)
Immature Granulocytes: 0 %
Lymphocytes Relative: 16 %
Lymphs Abs: 1.4 10*3/uL (ref 0.7–4.0)
MCH: 31.9 pg (ref 26.0–34.0)
MCHC: 33.2 g/dL (ref 30.0–36.0)
MCV: 96.3 fL (ref 80.0–100.0)
Monocytes Absolute: 0.6 10*3/uL (ref 0.1–1.0)
Monocytes Relative: 7 %
Neutro Abs: 6.5 10*3/uL (ref 1.7–7.7)
Neutrophils Relative %: 76 %
Platelet Count: 150 10*3/uL (ref 150–400)
RBC: 3.79 MIL/uL — ABNORMAL LOW (ref 3.87–5.11)
RDW: 14 % (ref 11.5–15.5)
WBC Count: 8.6 10*3/uL (ref 4.0–10.5)
nRBC: 0 % (ref 0.0–0.2)

## 2022-06-10 DIAGNOSIS — N183 Chronic kidney disease, stage 3 unspecified: Secondary | ICD-10-CM | POA: Diagnosis not present

## 2022-06-16 ENCOUNTER — Other Ambulatory Visit: Payer: Self-pay | Admitting: Cardiovascular Disease

## 2022-07-17 ENCOUNTER — Other Ambulatory Visit: Payer: Self-pay | Admitting: Cardiology

## 2022-07-17 DIAGNOSIS — I4819 Other persistent atrial fibrillation: Secondary | ICD-10-CM

## 2022-07-18 NOTE — Telephone Encounter (Signed)
Prescription refill request for Xarelto received.  Indication:PAF Last office visit: 05/20/21. Overdue Weight: 145# Age: 82 Scr: 1.41 06/03/22 epic CrCl: 31 mL/min

## 2022-07-20 ENCOUNTER — Ambulatory Visit: Payer: PPO | Attending: Cardiovascular Disease | Admitting: Cardiovascular Disease

## 2022-07-20 ENCOUNTER — Encounter: Payer: Self-pay | Admitting: Cardiovascular Disease

## 2022-07-20 VITALS — BP 130/60 | HR 86 | Ht 61.5 in | Wt 150.6 lb

## 2022-07-20 DIAGNOSIS — T466X5A Adverse effect of antihyperlipidemic and antiarteriosclerotic drugs, initial encounter: Secondary | ICD-10-CM | POA: Diagnosis not present

## 2022-07-20 DIAGNOSIS — I25708 Atherosclerosis of coronary artery bypass graft(s), unspecified, with other forms of angina pectoris: Secondary | ICD-10-CM

## 2022-07-20 DIAGNOSIS — G720 Drug-induced myopathy: Secondary | ICD-10-CM

## 2022-07-20 DIAGNOSIS — E7800 Pure hypercholesterolemia, unspecified: Secondary | ICD-10-CM | POA: Diagnosis not present

## 2022-07-20 DIAGNOSIS — I4821 Permanent atrial fibrillation: Secondary | ICD-10-CM | POA: Diagnosis not present

## 2022-07-20 DIAGNOSIS — I340 Nonrheumatic mitral (valve) insufficiency: Secondary | ICD-10-CM

## 2022-07-20 DIAGNOSIS — N1832 Chronic kidney disease, stage 3b: Secondary | ICD-10-CM | POA: Diagnosis not present

## 2022-07-20 DIAGNOSIS — I10 Essential (primary) hypertension: Secondary | ICD-10-CM

## 2022-07-20 DIAGNOSIS — E663 Overweight: Secondary | ICD-10-CM

## 2022-07-20 DIAGNOSIS — D6869 Other thrombophilia: Secondary | ICD-10-CM

## 2022-07-20 NOTE — Patient Instructions (Signed)
Medication Instructions:  No changes *If you need a refill on your cardiac medications before your next appointment, please call your pharmacy*  Follow-Up: At [facility], you and your health needs are our priority.  As part of our continuing mission to provide you with exceptional heart care, we have created designated Provider Care Teams.  These Care Teams include your primary Cardiologist (physician) and Advanced Practice Providers (APPs -  Physician Assistants and Nurse Practitioners) who all work together to provide you with the care you need, when you need it.  We recommend signing up for the patient portal called "MyChart".  Sign up information is provided on this After Visit Summary.  MyChart is used to connect with patients for Virtual Visits (Telemedicine).  Patients are able to view lab/test results, encounter notes, upcoming appointments, etc.  Non-urgent messages can be sent to your provider as well.   To learn more about what you can do with MyChart, go to [email or web address].    Your next appointment:   1 year(s)  Provider:   Mihai Croitoru, MD      
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
## 2022-07-29 DIAGNOSIS — L570 Actinic keratosis: Secondary | ICD-10-CM | POA: Diagnosis not present

## 2022-07-29 DIAGNOSIS — C44319 Basal cell carcinoma of skin of other parts of face: Secondary | ICD-10-CM | POA: Diagnosis not present

## 2022-07-29 DIAGNOSIS — C4442 Squamous cell carcinoma of skin of scalp and neck: Secondary | ICD-10-CM | POA: Diagnosis not present

## 2022-07-29 DIAGNOSIS — C44311 Basal cell carcinoma of skin of nose: Secondary | ICD-10-CM | POA: Diagnosis not present

## 2022-07-29 DIAGNOSIS — C4441 Basal cell carcinoma of skin of scalp and neck: Secondary | ICD-10-CM | POA: Diagnosis not present

## 2022-07-29 DIAGNOSIS — D485 Neoplasm of uncertain behavior of skin: Secondary | ICD-10-CM | POA: Diagnosis not present

## 2022-07-29 DIAGNOSIS — L814 Other melanin hyperpigmentation: Secondary | ICD-10-CM | POA: Diagnosis not present

## 2022-08-23 DIAGNOSIS — M79661 Pain in right lower leg: Secondary | ICD-10-CM | POA: Diagnosis not present

## 2022-08-23 DIAGNOSIS — M25511 Pain in right shoulder: Secondary | ICD-10-CM | POA: Diagnosis not present

## 2022-09-01 DIAGNOSIS — C4442 Squamous cell carcinoma of skin of scalp and neck: Secondary | ICD-10-CM | POA: Diagnosis not present

## 2022-09-01 DIAGNOSIS — C44319 Basal cell carcinoma of skin of other parts of face: Secondary | ICD-10-CM | POA: Diagnosis not present

## 2022-10-13 DIAGNOSIS — C44311 Basal cell carcinoma of skin of nose: Secondary | ICD-10-CM | POA: Diagnosis not present

## 2022-10-13 DIAGNOSIS — C4441 Basal cell carcinoma of skin of scalp and neck: Secondary | ICD-10-CM | POA: Diagnosis not present

## 2022-10-23 DIAGNOSIS — I129 Hypertensive chronic kidney disease with stage 1 through stage 4 chronic kidney disease, or unspecified chronic kidney disease: Secondary | ICD-10-CM | POA: Diagnosis not present

## 2022-10-23 DIAGNOSIS — N1831 Chronic kidney disease, stage 3a: Secondary | ICD-10-CM | POA: Diagnosis not present

## 2022-10-23 DIAGNOSIS — E782 Mixed hyperlipidemia: Secondary | ICD-10-CM | POA: Diagnosis not present

## 2022-10-25 DIAGNOSIS — T560X1A Toxic effect of lead and its compounds, accidental (unintentional), initial encounter: Secondary | ICD-10-CM | POA: Diagnosis not present

## 2022-11-18 ENCOUNTER — Other Ambulatory Visit: Payer: Self-pay | Admitting: Cardiovascular Disease

## 2022-11-23 DIAGNOSIS — L821 Other seborrheic keratosis: Secondary | ICD-10-CM | POA: Diagnosis not present

## 2022-11-23 DIAGNOSIS — L570 Actinic keratosis: Secondary | ICD-10-CM | POA: Diagnosis not present

## 2022-11-23 DIAGNOSIS — L814 Other melanin hyperpigmentation: Secondary | ICD-10-CM | POA: Diagnosis not present

## 2022-11-23 DIAGNOSIS — D225 Melanocytic nevi of trunk: Secondary | ICD-10-CM | POA: Diagnosis not present

## 2022-11-23 DIAGNOSIS — Z08 Encounter for follow-up examination after completed treatment for malignant neoplasm: Secondary | ICD-10-CM | POA: Diagnosis not present

## 2022-11-23 DIAGNOSIS — Z85828 Personal history of other malignant neoplasm of skin: Secondary | ICD-10-CM | POA: Diagnosis not present

## 2022-11-23 DIAGNOSIS — Z7189 Other specified counseling: Secondary | ICD-10-CM | POA: Diagnosis not present

## 2022-11-30 ENCOUNTER — Other Ambulatory Visit: Payer: Self-pay

## 2022-11-30 DIAGNOSIS — Z170 Estrogen receptor positive status [ER+]: Secondary | ICD-10-CM

## 2022-11-30 NOTE — Assessment & Plan Note (Deleted)
-  Diagnosed in 08/2019. S/p right lumpectomy with Dr Corliss Skains and Adjuvant radiation.  -She started Tamoxifen in 12/2019. She is tolerating well. Continue for 5 years. -Bilateral mammogram 05/03/2021 showed no evidence of malignancy on the left and probably benign calcifications in the upper outer right breast, these were stable on 20-month recall right mammo on 11/04/2021.  She had annual bilateral mammogram 05/12/2022 which showed stable, likely benign calcifications in upper outer quadrant of the right breast. Recommended diagnostic mammogram in 12 months to ensure 2 years of stability.  -Sandra Bradford is clinically doing well.  Tolerating tamoxifen. Labs are stable.  Overall there is no clinical concern for breast cancer recurrence. She declined a breast exam today since she just had her mammogram and reportedly had a breast exam that day as well.  -due for bilateral, diagnostic mammogram in 04/2023.

## 2022-11-30 NOTE — Progress Notes (Deleted)
Patient Care Team: Assunta Found, MD as PCP - General (Family Medicine) Croitoru, Rachelle Hora, MD as PCP - Cardiology (Cardiology) Laqueta Linden, MD (Inactive) as Attending Physician (Cardiology) Donnelly Angelica, RN as Oncology Nurse Navigator Pershing Proud, RN as Oncology Nurse Navigator Dorothy Puffer, MD as Consulting Physician (Radiation Oncology) Malachy Mood, MD as Consulting Physician (Hematology) Manus Rudd, MD as Consulting Physician (General Surgery) Pollyann Samples, NP as Nurse Practitioner (Nurse Practitioner)  Clinic Day:  11/30/2022  Referring physician: Assunta Found, MD  ASSESSMENT & PLAN:   Assessment & Plan: Malignant neoplasm of upper-outer quadrant of right breast in female, estrogen receptor positive (HCC) -Diagnosed in 08/2019. S/p right lumpectomy with Dr Corliss Skains and Adjuvant radiation.  -She started Tamoxifen in 12/2019. She is tolerating well. Continue for 5 years. -Bilateral mammogram 05/03/2021 showed no evidence of malignancy on the left and probably benign calcifications in the upper outer right breast, these were stable on 44-month recall right mammo on 11/04/2021.  She had annual bilateral mammogram 05/12/2022 which showed stable, likely benign calcifications in upper outer quadrant of the right breast. Recommended diagnostic mammogram in 12 months to ensure 2 years of stability.  -Sandra Bradford is clinically doing well.  Tolerating tamoxifen. Labs are stable.  Overall there is no clinical concern for breast cancer recurrence. She declined a breast exam today since she just had her mammogram and reportedly had a breast exam that day as well.  -due for bilateral, diagnostic mammogram in 04/2023.    Plan: Review labs Continue tamoxifen daily through 12/2024 Bilateral, diagnostic mammogram due 04/2023 and has been ordered.  Labs, follow up, imaging review in 6 months.  The patient understands the plans discussed today and is in agreement with them.  She knows to contact  our office if she develops concerns prior to her next appointment.  I provided *** minutes of face-to-face time during this encounter and > 50% was spent counseling as documented under my assessment and plan.    Carlean Jews, NP  [place] [facility] AT [facility] [address] [place] [address] Dept: [phone] Dept Fax: [phone]   No orders of the defined types were placed in this encounter.     CHIEF COMPLAINT:  CC: right breast cancer; ER+, PR+, HER2-  Current Treatment:  tamoxifen 20 mg daily   INTERVAL HISTORY:  Sandra Bradford is here today for repeat clinical assessment. She denies fevers or chills. She denies pain. Her appetite is good. Her weight {Weight change:10426}.  mammogram 05/12/2022 showed stable, likely benign calcifications in upper outer quadrant of the right breast. Recommended diagnostic mammogram in 12 months to ensure 2 years of stability.  -Sandra Bradford is clinically doing well.  Tolerating tamoxifen. Labs are stable.  Overall there is no clinical concern for breast cancer recurrence. She declined a breast exam today since she just had her mammogram and reportedly had a breast exam that day as well.  -due for bilateral, diagnostic mammogram in 04/2023.   I have reviewed the past medical history, past surgical history, social history and family history with the patient and they are unchanged from previous note.  ALLERGIES:  is allergic to codeine and statins.  MEDICATIONS:  Current Outpatient Medications  Medication Sig Dispense Refill   REPATHA SURECLICK 140 MG/ML SOAJ INJECT THE contents of ONE syringe into THE SKIN once every 14 days 2 mL 11   acetaminophen (TYLENOL) 325 MG tablet Take 650 mg by mouth every  6 (six) hours as needed for mild pain, moderate pain, fever or headache.      allopurinol (ZYLOPRIM) 100 MG tablet Take 100 mg by mouth daily. (Patient not taking: Reported on 07/20/2022)     colchicine  0.6 MG tablet Take 0.6 mg by mouth daily as needed (gout).      furosemide (LASIX) 20 MG tablet TAKE ONE TABLET BY MOUTH EVERY MORNING 90 tablet 1   metoprolol tartrate (LOPRESSOR) 50 MG tablet TAKE ONE TABLET BY MOUTH TWICE DAILY 180 tablet 1   Multiple Vitamin (MULITIVITAMIN WITH MINERALS) TABS Take 1 tablet by mouth daily.     quinapril (ACCUPRIL) 10 MG tablet TAKE ONE TABLET BY MOUTH EVERY MORNING 90 tablet 3   tamoxifen (NOLVADEX) 20 MG tablet Take 1 tablet (20 mg total) by mouth every morning. 90 tablet 3   XARELTO 15 MG TABS tablet TAKE ONE TABLET BY MOUTH EVERY EVENING 90 tablet 2   No current facility-administered medications for this visit.    HISTORY OF PRESENT ILLNESS:   Oncology History Overview Note  Cancer Staging Malignant neoplasm of upper-outer quadrant of right breast in female, estrogen receptor positive (HCC) Staging form: Breast, AJCC 8th Edition - Clinical stage from 09/17/2019: Stage IA (cT1c, cN0, cM0, G2, ER+, PR+, HER2-) - Signed by Malachy Mood, MD on 09/24/2019 - Pathologic stage from 11/07/2019: Stage Unknown (pT1b, pNX, cM0, G2, ER+, PR+, HER2-) - Unsigned    Malignant neoplasm of upper-outer quadrant of right breast in female, estrogen receptor positive (HCC)  09/02/2019 Mammogram   Diagnostic Mammogram  IMPRESSION: Indeterminate calcifications which measures 1.0 x 1.7 x 1.6 centimeters and associated parenchymal density in the UPPER-OUTER QUADRANT of the RIGHT breast, warranting tissue diagnosis.   09/17/2019 Cancer Staging   Staging form: Breast, AJCC 8th Edition - Clinical stage from 09/17/2019: Stage IA (cT1c, cN0, cM0, G2, ER+, PR+, HER2-) - Signed by Malachy Mood, MD on 09/24/2019   09/17/2019 Initial Biopsy   Diagnosis Breast, right, needle core biopsy, post 1/3 OUQ - INVASIVE MAMMARY CARCINOMA. - MAMMARY CARCINOMA IN SITU WITH ASSOCIATED CALCIFICATIONS. - SEE COMMENT. Microscopic Comment The carcinoma appears grade 2. The longest span of tumor is 0.4 cm.  An E-cadherin and a breast prognostic profile will be performed and the results reported separately. Dr. Valinda Hoar has reviewed the case and concurs with this interpretation. The results are called to [facility] on 09/18/2019.   09/17/2019 Receptors her2   PROGNOSTIC INDICATORS Results: IMMUNOHISTOCHEMICAL AND MORPHOMETRIC ANALYSIS PERFORMED MANUALLY The tumor cells are NEGATIVE for Her2 (0). Estrogen Receptor: 80%, POSITIVE, STRONG STAINING INTENSITY Progesterone Receptor: 70%, POSITIVE, STRONG STAINING INTENSITY Proliferation Marker Ki67: 15%   09/24/2019 Initial Diagnosis   Malignant neoplasm of upper-outer quadrant of right breast in female, estrogen receptor positive (HCC)   10/17/2019 Surgery   RIGHT BREAST LUMPECTOMY WITH RADIOACTIVE SEED LOCALIZATION by Dr Corliss Skains    10/17/2019 Pathology Results   FINAL MICROSCOPIC DIAGNOSIS:   A. BREAST, RIGHT, LUMPECTOMY:  - Invasive ductal carcinoma, grade 2, 0.8 cm  - Ductal carcinoma in situ, intermediate grade, with calcifications  - Resection margins are negative for invasive carcinoma; closest is the  inferior margin at 0.3 cm  - DCIS is focally less than 1 mm from superior margin  - Negative for lymphovascular or perineural invasion  - Biopsy site changes  - See oncology table    11/07/2019 Cancer Staging   Staging form: Breast, AJCC 8th Edition - Pathologic stage from 11/07/2019: Stage Unknown (  pT1b, pNX, cM0, G2, ER+, PR+, HER2-) - Signed by Pollyann Samples, NP on 03/10/2020   11/18/2019 - 12/13/2019 Radiation Therapy   Adjuvant Radiation with Dr Mitzi Hansen    12/2019 -  Anti-estrogen oral therapy   Tamoxifen 20 mg once daily    03/12/2020 Survivorship   SCP delivered by Santiago Glad, NP        REVIEW OF SYSTEMS:   Constitutional: Denies fevers, chills or abnormal weight loss Eyes: Denies blurriness of vision Ears, nose, mouth, throat, and face: Denies mucositis or sore throat Respiratory: Denies cough,  dyspnea or wheezes Cardiovascular: Denies palpitation, chest discomfort or lower extremity swelling Gastrointestinal:  Denies nausea, heartburn or change in bowel habits Skin: Denies abnormal skin rashes Lymphatics: Denies new lymphadenopathy or easy bruising Neurological:Denies numbness, tingling or new weaknesses Behavioral/Psych: Mood is stable, no new changes  All other systems were reviewed with the patient and are negative.   VITALS:  Last menstrual period 04/25/1978.  Wt Readings from Last 3 Encounters:  07/20/22 150 lb 9.6 oz (68.3 kg)  06/03/22 145 lb 3.2 oz (65.9 kg)  12/20/21 145 lb (65.8 kg)    There is no height or weight on file to calculate BMI.  Performance status (ECOG): {CHL ONC [id number]  PHYSICAL EXAM:   GENERAL:alert, no distress and comfortable SKIN: skin color, texture, turgor are normal, no rashes or significant lesions EYES: normal, Conjunctiva are pink and non-injected, sclera clear OROPHARYNX:no exudate, no erythema and lips, buccal mucosa, and tongue normal  NECK: supple, thyroid normal size, non-tender, without nodularity LYMPH:  no palpable lymphadenopathy in the cervical, axillary or inguinal LUNGS: clear to auscultation and percussion with normal breathing effort HEART: regular rate & rhythm and no murmurs and no lower extremity edema ABDOMEN:abdomen soft, non-tender and normal bowel sounds Musculoskeletal:no cyanosis of digits and no clubbing  NEURO: alert & oriented x 3 with fluent speech, no focal motor/sensory deficits  LABORATORY DATA:  I have reviewed the data as listed    Component Value Date/Time   NA 143 06/03/2022 0926   K 3.7 06/03/2022 0926   CL 106 06/03/2022 0926   CO2 30 06/03/2022 0926   GLUCOSE 118 (H) 06/03/2022 0926   BUN 37 (H) 06/03/2022 0926   CREATININE 1.41 (H) 06/03/2022 0926   CREATININE 1.62 (H) 06/16/2014 1039   CALCIUM 9.1 06/03/2022 0926   PROT 6.9 06/03/2022 0926   ALBUMIN 3.6 06/03/2022 0926   AST  25 06/03/2022 0926   ALT 12 06/03/2022 0926   ALKPHOS 54 06/03/2022 0926   BILITOT 0.5 06/03/2022 0926   GFRNONAA 37 (L) 06/03/2022 0926   GFRAA 42 (L) 10/14/2019 1422   GFRAA 41 (L) 09/25/2019 1142    Lab Results  Component Value Date   WBC 8.6 06/03/2022   NEUTROABS 6.5 06/03/2022   HGB 12.1 06/03/2022   HCT 36.5 06/03/2022   MCV 96.3 06/03/2022   PLT 150 06/03/2022    Plan: Review labs Continue tamoxifen daily through 12/2024 Bilateral, diagnostic mammogram due 04/2023 and has been ordered.  Labs, follow up, imaging review in 6 months.

## 2022-12-01 ENCOUNTER — Inpatient Hospital Stay: Payer: PPO | Attending: Nurse Practitioner

## 2022-12-01 ENCOUNTER — Inpatient Hospital Stay: Payer: PPO | Admitting: Nurse Practitioner

## 2022-12-01 DIAGNOSIS — Z170 Estrogen receptor positive status [ER+]: Secondary | ICD-10-CM

## 2022-12-01 DIAGNOSIS — Z79810 Long term (current) use of selective estrogen receptor modulators (SERMs): Secondary | ICD-10-CM | POA: Insufficient documentation

## 2022-12-01 DIAGNOSIS — C50411 Malignant neoplasm of upper-outer quadrant of right female breast: Secondary | ICD-10-CM | POA: Insufficient documentation

## 2022-12-01 DIAGNOSIS — Z923 Personal history of irradiation: Secondary | ICD-10-CM | POA: Insufficient documentation

## 2022-12-02 ENCOUNTER — Telehealth: Payer: Self-pay | Admitting: Hematology
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
## 2022-12-05 ENCOUNTER — Encounter: Payer: Self-pay | Admitting: Nurse Practitioner

## 2022-12-05 ENCOUNTER — Inpatient Hospital Stay: Payer: PPO | Admitting: Nurse Practitioner

## 2022-12-05 ENCOUNTER — Other Ambulatory Visit: Payer: Self-pay

## 2022-12-05 ENCOUNTER — Other Ambulatory Visit: Payer: PPO

## 2022-12-05 DIAGNOSIS — C50411 Malignant neoplasm of upper-outer quadrant of right female breast: Secondary | ICD-10-CM

## 2022-12-05 DIAGNOSIS — Z170 Estrogen receptor positive status [ER+]: Secondary | ICD-10-CM

## 2022-12-05 DIAGNOSIS — Z923 Personal history of irradiation: Secondary | ICD-10-CM | POA: Diagnosis not present

## 2022-12-05 DIAGNOSIS — Z79810 Long term (current) use of selective estrogen receptor modulators (SERMs): Secondary | ICD-10-CM | POA: Diagnosis not present

## 2022-12-05 LAB — CBC WITH DIFFERENTIAL (CANCER CENTER ONLY)
Abs Immature Granulocytes: 0.02 10*3/uL (ref 0.00–0.07)
Basophils Absolute: 0.1 10*3/uL (ref 0.0–0.1)
Basophils Relative: 1 %
Eosinophils Absolute: 0.1 10*3/uL (ref 0.0–0.5)
Eosinophils Relative: 2 %
HCT: 35.1 % — ABNORMAL LOW (ref 36.0–46.0)
Hemoglobin: 11.7 g/dL — ABNORMAL LOW (ref 12.0–15.0)
Immature Granulocytes: 0 %
Lymphocytes Relative: 22 %
Lymphs Abs: 1.5 10*3/uL (ref 0.7–4.0)
MCH: 31.7 pg (ref 26.0–34.0)
MCHC: 33.3 g/dL (ref 30.0–36.0)
MCV: 95.1 fL (ref 80.0–100.0)
Monocytes Absolute: 0.6 10*3/uL (ref 0.1–1.0)
Monocytes Relative: 9 %
Neutro Abs: 4.4 10*3/uL (ref 1.7–7.7)
Neutrophils Relative %: 66 %
Platelet Count: 145 10*3/uL — ABNORMAL LOW (ref 150–400)
RBC: 3.69 MIL/uL — ABNORMAL LOW (ref 3.87–5.11)
RDW: 13.9 % (ref 11.5–15.5)
WBC Count: 6.7 10*3/uL (ref 4.0–10.5)
nRBC: 0 % (ref 0.0–0.2)

## 2022-12-05 LAB — CMP (CANCER CENTER ONLY)
ALT: 15 U/L (ref 0–44)
AST: 27 U/L (ref 15–41)
Albumin: 3.6 g/dL (ref 3.5–5.0)
Alkaline Phosphatase: 53 U/L (ref 38–126)
Anion gap: 7 (ref 5–15)
BUN: 25 mg/dL — ABNORMAL HIGH (ref 8–23)
CO2: 30 mmol/L (ref 22–32)
Calcium: 8.9 mg/dL (ref 8.9–10.3)
Chloride: 105 mmol/L (ref 98–111)
Creatinine: 1.3 mg/dL — ABNORMAL HIGH (ref 0.44–1.00)
GFR, Estimated: 41 mL/min — ABNORMAL LOW (ref >60)
Glucose, Bld: 97 mg/dL (ref 70–99)
Potassium: 4.5 mmol/L (ref 3.5–5.1)
Sodium: 142 mmol/L (ref 135–145)
Total Bilirubin: 0.8 mg/dL (ref 0.3–1.2)
Total Protein: 6.7 g/dL (ref 6.5–8.1)

## 2022-12-05 MED ORDER — TAMOXIFEN CITRATE 20 MG PO TABS: 20.0000 mg | ORAL_TABLET | Freq: Every morning | ORAL | 3 refills | Status: DC

## 2022-12-06 ENCOUNTER — Telehealth: Payer: Self-pay | Admitting: Nurse Practitioner

## 2022-12-21 DIAGNOSIS — Z09 Encounter for follow-up examination after completed treatment for conditions other than malignant neoplasm: Secondary | ICD-10-CM | POA: Diagnosis not present

## 2022-12-21 DIAGNOSIS — Z7189 Other specified counseling: Secondary | ICD-10-CM | POA: Diagnosis not present

## 2022-12-21 DIAGNOSIS — Z872 Personal history of diseases of the skin and subcutaneous tissue: Secondary | ICD-10-CM | POA: Diagnosis not present

## 2022-12-21 DIAGNOSIS — D485 Neoplasm of uncertain behavior of skin: Secondary | ICD-10-CM | POA: Diagnosis not present

## 2022-12-24 DIAGNOSIS — D6869 Other thrombophilia: Secondary | ICD-10-CM | POA: Diagnosis not present

## 2022-12-24 DIAGNOSIS — I4821 Permanent atrial fibrillation: Secondary | ICD-10-CM | POA: Diagnosis not present

## 2022-12-24 DIAGNOSIS — I129 Hypertensive chronic kidney disease with stage 1 through stage 4 chronic kidney disease, or unspecified chronic kidney disease: Secondary | ICD-10-CM | POA: Diagnosis not present

## 2022-12-24 DIAGNOSIS — M109 Gout, unspecified: Secondary | ICD-10-CM | POA: Diagnosis not present

## 2023-02-06 DIAGNOSIS — Z23 Encounter for immunization: Secondary | ICD-10-CM | POA: Diagnosis not present

## 2023-02-06 DIAGNOSIS — E663 Overweight: Secondary | ICD-10-CM | POA: Diagnosis not present

## 2023-02-06 DIAGNOSIS — Z0001 Encounter for general adult medical examination with abnormal findings: Secondary | ICD-10-CM | POA: Diagnosis not present

## 2023-02-06 DIAGNOSIS — M45A6 Non-radiographic axial spondyloarthritis of lumbar region: Secondary | ICD-10-CM | POA: Diagnosis not present

## 2023-02-06 DIAGNOSIS — N1831 Chronic kidney disease, stage 3a: Secondary | ICD-10-CM | POA: Diagnosis not present

## 2023-02-06 DIAGNOSIS — Z6826 Body mass index (BMI) 26.0-26.9, adult: Secondary | ICD-10-CM | POA: Diagnosis not present

## 2023-02-06 DIAGNOSIS — Z1331 Encounter for screening for depression: Secondary | ICD-10-CM | POA: Diagnosis not present

## 2023-02-06 DIAGNOSIS — I4821 Permanent atrial fibrillation: Secondary | ICD-10-CM | POA: Diagnosis not present

## 2023-02-06 DIAGNOSIS — C50911 Malignant neoplasm of unspecified site of right female breast: Secondary | ICD-10-CM | POA: Diagnosis not present

## 2023-02-06 DIAGNOSIS — I2510 Atherosclerotic heart disease of native coronary artery without angina pectoris: Secondary | ICD-10-CM | POA: Diagnosis not present

## 2023-02-14 DIAGNOSIS — M25551 Pain in right hip: Secondary | ICD-10-CM | POA: Diagnosis not present

## 2023-02-14 DIAGNOSIS — M545 Low back pain, unspecified: Secondary | ICD-10-CM | POA: Diagnosis not present

## 2023-02-15 ENCOUNTER — Other Ambulatory Visit (HOSPITAL_COMMUNITY): Payer: Self-pay | Admitting: Orthopedic Surgery

## 2023-02-15 DIAGNOSIS — M5459 Other low back pain: Secondary | ICD-10-CM

## 2023-02-24 ENCOUNTER — Ambulatory Visit (HOSPITAL_COMMUNITY)
Admission: RE | Admit: 2023-02-24 | Discharge: 2023-02-24 | Disposition: A | Discharge status: Home / Self Care | Payer: PPO | Source: Ambulatory Visit | Attending: Orthopedic Surgery | Admitting: Orthopedic Surgery

## 2023-02-24 DIAGNOSIS — M48061 Spinal stenosis, lumbar region without neurogenic claudication: Secondary | ICD-10-CM | POA: Diagnosis not present

## 2023-02-24 DIAGNOSIS — M5459 Other low back pain: Secondary | ICD-10-CM | POA: Diagnosis not present

## 2023-02-24 DIAGNOSIS — M5117 Intervertebral disc disorders with radiculopathy, lumbosacral region: Secondary | ICD-10-CM | POA: Diagnosis not present

## 2023-02-24 DIAGNOSIS — K5730 Diverticulosis of large intestine without perforation or abscess without bleeding: Secondary | ICD-10-CM | POA: Diagnosis not present

## 2023-02-24 DIAGNOSIS — M7602 Gluteal tendinitis, left hip: Secondary | ICD-10-CM | POA: Diagnosis not present

## 2023-02-24 DIAGNOSIS — M5116 Intervertebral disc disorders with radiculopathy, lumbar region: Secondary | ICD-10-CM | POA: Diagnosis not present

## 2023-02-24 DIAGNOSIS — Z170 Estrogen receptor positive status [ER+]: Secondary | ICD-10-CM

## 2023-02-24 DIAGNOSIS — M4726 Other spondylosis with radiculopathy, lumbar region: Secondary | ICD-10-CM | POA: Diagnosis not present

## 2023-02-24 DIAGNOSIS — M7601 Gluteal tendinitis, right hip: Secondary | ICD-10-CM | POA: Diagnosis not present

## 2023-02-24 DIAGNOSIS — M47816 Spondylosis without myelopathy or radiculopathy, lumbar region: Secondary | ICD-10-CM | POA: Diagnosis not present

## 2023-02-24 DIAGNOSIS — C50411 Malignant neoplasm of upper-outer quadrant of right female breast: Secondary | ICD-10-CM | POA: Insufficient documentation

## 2023-03-14 DIAGNOSIS — M25551 Pain in right hip: Secondary | ICD-10-CM | POA: Diagnosis not present

## 2023-04-11 DIAGNOSIS — M47816 Spondylosis without myelopathy or radiculopathy, lumbar region: Secondary | ICD-10-CM | POA: Diagnosis not present

## 2023-04-17 ENCOUNTER — Other Ambulatory Visit: Payer: Self-pay | Admitting: Cardiovascular Disease

## 2023-04-17 DIAGNOSIS — Z170 Estrogen receptor positive status [ER+]: Secondary | ICD-10-CM

## 2023-04-17 DIAGNOSIS — I4819 Other persistent atrial fibrillation: Secondary | ICD-10-CM

## 2023-04-17 MED ORDER — METOPROLOL TARTRATE 50 MG PO TABS: 50.0000 mg | ORAL_TABLET | Freq: Two times a day (BID) | ORAL | 0 refills | Status: DC

## 2023-04-17 MED ORDER — RIVAROXABAN 15 MG PO TABS: 15.0000 mg | ORAL_TABLET | Freq: Every evening | ORAL | 1 refills | Status: DC

## 2023-04-17 MED ORDER — TAMOXIFEN CITRATE 20 MG PO TABS: 20.0000 mg | ORAL_TABLET | Freq: Every morning | ORAL | 0 refills | Status: DC

## 2023-04-17 MED ORDER — FUROSEMIDE 20 MG PO TABS: 20.0000 mg | ORAL_TABLET | Freq: Every morning | ORAL | 0 refills | Status: DC

## 2023-04-17 NOTE — Telephone Encounter (Signed)
*  STAT* If patient is at the pharmacy, call can be transferred to refill team.   1. Which medications need to be refilled? (please list name of each medication and dose if known) new prescriptions for Metoprolol, Xareelto,  Tamoxifen, Allopurinol, and Furosemide   2. Would you like to learn more about the convenience, safety, & potential cost savings by using the [facility]?     3. Are you open to using the [facility] (Type [facility].  4. Which pharmacy/location (including street and city if local pharmacy) is medication to be sent to? [facility] [address] [place]   5. Do they need a 30 day or 90 day supply? 90 days and refills- please call in today- out of medicine

## 2023-04-17 NOTE — Telephone Encounter (Signed)
Xarelto 15mg  refill request received. Pt is 82 years old, weight-67.9kg, Crea-1.30 on 12/05/22, last seen by Dr. Royann Shivers on 07/20/22, Diagnosis-Afib, CrCl- 34.53 mL/min; Dose is appropriate based on dosing criteria. Will send in refill to requested pharmacy.

## 2023-04-17 NOTE — Telephone Encounter (Signed)
 Pt is requesting [facility]

## 2023-04-28 DIAGNOSIS — M5459 Other low back pain: Secondary | ICD-10-CM | POA: Diagnosis not present

## 2023-04-28 DIAGNOSIS — M48061 Spinal stenosis, lumbar region without neurogenic claudication: Secondary | ICD-10-CM | POA: Diagnosis not present

## 2023-05-02 DIAGNOSIS — M48061 Spinal stenosis, lumbar region without neurogenic claudication: Secondary | ICD-10-CM | POA: Diagnosis not present

## 2023-05-02 DIAGNOSIS — M5459 Other low back pain: Secondary | ICD-10-CM | POA: Diagnosis not present

## 2023-05-11 DIAGNOSIS — M48061 Spinal stenosis, lumbar region without neurogenic claudication: Secondary | ICD-10-CM | POA: Diagnosis not present

## 2023-05-11 DIAGNOSIS — M5459 Other low back pain: Secondary | ICD-10-CM | POA: Diagnosis not present

## 2023-05-16 DIAGNOSIS — M5459 Other low back pain: Secondary | ICD-10-CM | POA: Diagnosis not present

## 2023-05-16 DIAGNOSIS — M48061 Spinal stenosis, lumbar region without neurogenic claudication: Secondary | ICD-10-CM | POA: Diagnosis not present

## 2023-05-18 ENCOUNTER — Telehealth: Payer: Self-pay | Admitting: Emergency Medicine

## 2023-05-18 NOTE — Telephone Encounter (Signed)
Called to get scheduled for yearly appt due in March/April- also received a fax from the [employer] about her being approved for the grant from 04/01/23-03/30/24. Amount is $2,500. Wanted to make sure she is aware of this information. Left number to call back  (I have the copy in red folder on cart)

## 2023-05-23 DIAGNOSIS — M48061 Spinal stenosis, lumbar region without neurogenic claudication: Secondary | ICD-10-CM | POA: Diagnosis not present

## 2023-05-23 DIAGNOSIS — M5459 Other low back pain: Secondary | ICD-10-CM | POA: Diagnosis not present

## 2023-05-25 DIAGNOSIS — M5459 Other low back pain: Secondary | ICD-10-CM | POA: Diagnosis not present

## 2023-05-25 DIAGNOSIS — M48061 Spinal stenosis, lumbar region without neurogenic claudication: Secondary | ICD-10-CM | POA: Diagnosis not present

## 2023-06-04 NOTE — Progress Notes (Addendum)
Patient Care Team: Assunta Found, MD as PCP - General (Family Medicine) Croitoru, Rachelle Hora, MD as PCP - Cardiology (Cardiology) Laqueta Linden, MD (Inactive) as Attending Physician (Cardiology) Donnelly Angelica, RN as Oncology Nurse Navigator Pershing Proud, RN as Oncology Nurse Navigator Dorothy Puffer, MD as Consulting Physician (Radiation Oncology) Malachy Mood, MD as Consulting Physician (Hematology) Manus Rudd, MD as Consulting Physician (General Surgery) Pollyann Samples, NP as Nurse Practitioner (Nurse Practitioner)  Clinic Day:  06/11/2023  Referring physician: Assunta Found, MD  ASSESSMENT & PLAN:   Assessment & Plan: Malignant neoplasm of upper-outer quadrant of right breast in female, estrogen receptor positive (HCC) Stage Ia, p(T1bN0M0), with DCIS, ER/PR+/HER2-, Grade II  -Diagnosed in 08/2019. S/p right lumpectomy with Dr Corliss Skains and Adjuvant radiation.  -She started Tamoxifen in 12/2019. She is tolerating well. Continue for 5 years. -Most recent mammogram 05/12/2022 she has stable likely benign calcifications, no evidence of malignancy -Ms. Reasner is clinically doing well.  Tolerating tamoxifen without side effects.  Breast exam is benign, labs are stable.  Overall no clinical concern for recurrence -Continue breast cancer surveillance She is scheduled for 3D diagnostic mammogram in March 2025. Lab and follow-up in 6 months, or sooner if needed   Plan: Labs are stable. Benign exam. 3D diagnostic mammogram scheduled for March 2025. Continue tamoxifen 20 mg daily. Labs improving with in 6 months.  Sooner if needed.  The patient understands the plans discussed today and is in agreement with them.  She knows to contact our office if she develops concerns prior to her next appointment.  I provided 25 minutes of face-to-face time during this encounter and > 50% was spent counseling as documented under my assessment and plan.    Carlean Jews, NP  [facility] - A DEPT OF [facility] [address] [place] [address] Dept: [phone] Dept Fax: [phone]   No orders of the defined types were placed in this encounter.     CHIEF COMPLAINT:  CC: Right breast cancer, estrogen receptor positive  Current Treatment: Tamoxifen 20 mg daily, started 12/2019  INTERVAL HISTORY:  Terianna is here today for repeat clinical assessment.  She was last seen by Clayborn Heron, NP on 12/05/2022.  Tolerated tamoxifen well with minimal side effects.  She is scheduled for 3D diagnostic mammogram in March 2025.  She denies any new masses or lumps in either breast.  Denies problems with inversion or nipple discharge.  She denies chest pain, chest pressure, or shortness of breath. She denies headaches or visual disturbances. She denies abdominal pain, nausea, vomiting, or changes in bowel or bladder habits.  She denies fevers or chills. She denies pain. Her appetite is good. Her weight has been stable.  I have reviewed the past medical history, past surgical history, social history and family history with the patient and they are unchanged from previous note.  ALLERGIES:  is allergic to codeine and statins.  MEDICATIONS:  Current Outpatient Medications  Medication Sig Dispense Refill   acetaminophen (TYLENOL) 325 MG tablet Take 650 mg by mouth every 6 (six) hours as needed for mild pain, moderate pain, fever or headache.      allopurinol (ZYLOPRIM) 100 MG tablet Take 100 mg by mouth daily.     colchicine 0.6 MG tablet Take 0.6 mg by mouth daily as needed (gout).      furosemide (LASIX) 20 MG tablet Take 1 tablet (20 mg total)  by mouth every morning. 90 tablet 0   metoprolol tartrate (LOPRESSOR) 50 MG tablet Take 1 tablet (50 mg total) by mouth 2 (two) times daily. 180 tablet 0   Multiple Vitamin (MULITIVITAMIN WITH MINERALS) TABS Take 1 tablet by mouth daily.     REPATHA SURECLICK 140 MG/ML SOAJ INJECT THE contents  of ONE syringe into THE SKIN once every 14 days 2 mL 11   Rivaroxaban (XARELTO) 15 MG TABS tablet Take 1 tablet (15 mg total) by mouth every evening. 90 tablet 1   tamoxifen (NOLVADEX) 20 MG tablet Take 1 tablet (20 mg total) by mouth every morning. 90 tablet 0   No current facility-administered medications for this visit.    HISTORY OF PRESENT ILLNESS:   Oncology History Overview Note  Cancer Staging Malignant neoplasm of upper-outer quadrant of right breast in female, estrogen receptor positive (HCC) Staging form: Breast, AJCC 8th Edition - Clinical stage from 09/17/2019: Stage IA (cT1c, cN0, cM0, G2, ER+, PR+, HER2-) - Signed by Malachy Mood, MD on 09/24/2019 - Pathologic stage from 11/07/2019: Stage Unknown (pT1b, pNX, cM0, G2, ER+, PR+, HER2-) - Unsigned    Malignant neoplasm of upper-outer quadrant of right breast in female, estrogen receptor positive (HCC)  09/02/2019 Mammogram   Diagnostic Mammogram  IMPRESSION: Indeterminate calcifications which measures 1.0 x 1.7 x 1.6 centimeters and associated parenchymal density in the UPPER-OUTER QUADRANT of the RIGHT breast, warranting tissue diagnosis.   09/17/2019 Cancer Staging   Staging form: Breast, AJCC 8th Edition - Clinical stage from 09/17/2019: Stage IA (cT1c, cN0, cM0, G2, ER+, PR+, HER2-) - Signed by Malachy Mood, MD on 09/24/2019   09/17/2019 Initial Biopsy   Diagnosis Breast, right, needle core biopsy, post 1/3 OUQ - INVASIVE MAMMARY CARCINOMA. - MAMMARY CARCINOMA IN SITU WITH ASSOCIATED CALCIFICATIONS. - SEE COMMENT. Microscopic Comment The carcinoma appears grade 2. The longest span of tumor is 0.4 cm. An E-cadherin and a breast prognostic profile will be performed and the results reported separately. Dr. Valinda Hoar has reviewed the case and concurs with this interpretation. The results are called to [facility] on 09/18/2019.   09/17/2019 Receptors her2   PROGNOSTIC INDICATORS Results: IMMUNOHISTOCHEMICAL  AND MORPHOMETRIC ANALYSIS PERFORMED MANUALLY The tumor cells are NEGATIVE for Her2 (0). Estrogen Receptor: 80%, POSITIVE, STRONG STAINING INTENSITY Progesterone Receptor: 70%, POSITIVE, STRONG STAINING INTENSITY Proliferation Marker Ki67: 15%   09/24/2019 Initial Diagnosis   Malignant neoplasm of upper-outer quadrant of right breast in female, estrogen receptor positive (HCC)   10/17/2019 Surgery   RIGHT BREAST LUMPECTOMY WITH RADIOACTIVE SEED LOCALIZATION by Dr Corliss Skains    10/17/2019 Pathology Results   FINAL MICROSCOPIC DIAGNOSIS:   A. BREAST, RIGHT, LUMPECTOMY:  - Invasive ductal carcinoma, grade 2, 0.8 cm  - Ductal carcinoma in situ, intermediate grade, with calcifications  - Resection margins are negative for invasive carcinoma; closest is the  inferior margin at 0.3 cm  - DCIS is focally less than 1 mm from superior margin  - Negative for lymphovascular or perineural invasion  - Biopsy site changes  - See oncology table    11/07/2019 Cancer Staging   Staging form: Breast, AJCC 8th Edition - Pathologic stage from 11/07/2019: Stage Unknown (pT1b, pNX, cM0, G2, ER+, PR+, HER2-) - Signed by Pollyann Samples, NP on 03/10/2020   11/18/2019 - 12/13/2019 Radiation Therapy   Adjuvant Radiation with Dr Mitzi Hansen    12/2019 -  Anti-estrogen oral therapy   Tamoxifen 20 mg once daily    03/12/2020  Survivorship   SCP delivered by Santiago Glad, NP        REVIEW OF SYSTEMS:   Constitutional: Denies fevers, chills or abnormal weight loss Eyes: Denies blurriness of vision Ears, nose, mouth, throat, and face: Denies mucositis or sore throat Respiratory: Denies cough, dyspnea or wheezes Cardiovascular: Denies palpitation, chest discomfort or lower extremity swelling Gastrointestinal:  Denies nausea, heartburn or change in bowel habits Skin: Denies abnormal skin rashes Lymphatics: Denies new lymphadenopathy or easy bruising Neurological:Denies numbness, tingling or new  weaknesses Behavioral/Psych: Mood is stable, no new changes  All other systems were reviewed with the patient and are negative.   VITALS:   Today's Vitals   06/05/23 1053 06/05/23 1117  BP: (!) 153/82 (!) 140/80  Pulse: 83   Resp: 18   Temp: 97.8 F (36.6 C)   TempSrc: Temporal   SpO2: 100%   Weight: 149 lb 12.8 oz (67.9 kg)    Body mass index is 28.3 kg/m.   Wt Readings from Last 3 Encounters:  06/05/23 149 lb 12.8 oz (67.9 kg)  12/05/22 149 lb 12.8 oz (67.9 kg)  07/20/22 150 lb 9.6 oz (68.3 kg)    Body mass index is 28.3 kg/m.  Performance status (ECOG): 0 - Asymptomatic  PHYSICAL EXAM:   GENERAL:alert, no distress and comfortable SKIN: skin color, texture, turgor are normal, no rashes or significant lesions EYES: normal, Conjunctiva are pink and non-injected, sclera clear OROPHARYNX:no exudate, no erythema and lips, buccal mucosa, and tongue normal  NECK: supple, thyroid normal size, non-tender, without nodularity LYMPH:  no palpable lymphadenopathy in the cervical, axillary or inguinal LUNGS: clear to auscultation and percussion with normal breathing effort HEART: regular rate & rhythm and no murmurs and no lower extremity edema ABDOMEN:abdomen soft, non-tender and normal bowel sounds Musculoskeletal:no cyanosis of digits and no clubbing  NEURO: alert & oriented x 3 with fluent speech, no focal motor/sensory deficits BREAST: SP right lumpectomy with scar is completely healed.  No palpable lump or mass in the right breast.  No nipple inversion or nipple discharge.  There is no axillary lymphadenopathy on the right.  Left breast is without palpable masses or lumps.  There is no nipple inversion or nipple discharge.  There is no early axillary lymphadenopathy on the left.  LABORATORY DATA:  I have reviewed the data as listed    Component Value Date/Time   NA 141 06/05/2023 1027   K 3.9 06/05/2023 1027   CL 102 06/05/2023 1027   CO2 33 (H) 06/05/2023 1027    GLUCOSE 92 06/05/2023 1027   BUN 24 (H) 06/05/2023 1027   CREATININE 1.19 (H) 06/05/2023 1027   CREATININE 1.62 (H) 06/16/2014 1039   CALCIUM 9.3 06/05/2023 1027   PROT 7.3 06/05/2023 1027   ALBUMIN 3.9 06/05/2023 1027   AST 31 06/05/2023 1027   ALT 17 06/05/2023 1027   ALKPHOS 66 06/05/2023 1027   BILITOT 0.8 06/05/2023 1027   GFRNONAA 45 (L) 06/05/2023 1027   GFRAA 42 (L) 10/14/2019 1422   GFRAA 41 (L) 09/25/2019 1142    Lab Results  Component Value Date   WBC 6.9 06/05/2023   NEUTROABS 4.2 06/05/2023   HGB 12.5 06/05/2023   HCT 38.3 06/05/2023   MCV 96.0 06/05/2023   PLT 153 06/05/2023

## 2023-06-04 NOTE — Assessment & Plan Note (Addendum)
 Stage Ia, p(T1bN0M0), with DCIS, ER/PR+/HER2-, Grade II  -Diagnosed in 08/2019. S/p right lumpectomy with Dr Belinda and Adjuvant radiation.  -She started Tamoxifen  in 12/2019. She is tolerating well. Continue for 5 years. -Most recent mammogram 05/12/2022 she has stable likely benign calcifications, no evidence of malignancy -Ms. Cude is clinically doing well.  Tolerating tamoxifen  without side effects.  Breast exam is benign, labs are stable.  Overall no clinical concern for recurrence -Continue breast cancer surveillance She is scheduled for 3D diagnostic mammogram in March 2025. Lab and follow-up in 6 months, or sooner if needed

## 2023-06-05 ENCOUNTER — Inpatient Hospital Stay: Payer: PPO | Admitting: Nurse Practitioner

## 2023-06-05 ENCOUNTER — Inpatient Hospital Stay: Payer: PPO | Attending: Hematology

## 2023-06-05 VITALS — BP 140/80 | HR 83 | Temp 97.8°F | Resp 18 | Wt 149.8 lb

## 2023-06-05 DIAGNOSIS — Z170 Estrogen receptor positive status [ER+]: Secondary | ICD-10-CM | POA: Insufficient documentation

## 2023-06-05 DIAGNOSIS — Z1732 Human epidermal growth factor receptor 2 negative status: Secondary | ICD-10-CM | POA: Insufficient documentation

## 2023-06-05 DIAGNOSIS — Z1721 Progesterone receptor positive status: Secondary | ICD-10-CM | POA: Insufficient documentation

## 2023-06-05 DIAGNOSIS — C50411 Malignant neoplasm of upper-outer quadrant of right female breast: Secondary | ICD-10-CM | POA: Insufficient documentation

## 2023-06-05 DIAGNOSIS — Z79899 Other long term (current) drug therapy: Secondary | ICD-10-CM | POA: Diagnosis not present

## 2023-06-05 LAB — CBC WITH DIFFERENTIAL (CANCER CENTER ONLY)
Abs Immature Granulocytes: 0.02 10*3/uL (ref 0.00–0.07)
Basophils Absolute: 0 10*3/uL (ref 0.0–0.1)
Basophils Relative: 0 %
Eosinophils Absolute: 0.1 10*3/uL (ref 0.0–0.5)
Eosinophils Relative: 2 %
HCT: 38.3 % (ref 36.0–46.0)
Hemoglobin: 12.5 g/dL (ref 12.0–15.0)
Immature Granulocytes: 0 %
Lymphocytes Relative: 27 %
Lymphs Abs: 1.8 10*3/uL (ref 0.7–4.0)
MCH: 31.3 pg (ref 26.0–34.0)
MCHC: 32.6 g/dL (ref 30.0–36.0)
MCV: 96 fL (ref 80.0–100.0)
Monocytes Absolute: 0.7 10*3/uL (ref 0.1–1.0)
Monocytes Relative: 10 %
Neutro Abs: 4.2 10*3/uL (ref 1.7–7.7)
Neutrophils Relative %: 61 %
Platelet Count: 153 10*3/uL (ref 150–400)
RBC: 3.99 MIL/uL (ref 3.87–5.11)
RDW: 13.4 % (ref 11.5–15.5)
WBC Count: 6.9 10*3/uL (ref 4.0–10.5)
nRBC: 0 % (ref 0.0–0.2)

## 2023-06-05 LAB — CMP (CANCER CENTER ONLY)
ALT: 17 U/L (ref 0–44)
AST: 31 U/L (ref 15–41)
Albumin: 3.9 g/dL (ref 3.5–5.0)
Alkaline Phosphatase: 66 U/L (ref 38–126)
Anion gap: 6 (ref 5–15)
BUN: 24 mg/dL — ABNORMAL HIGH (ref 8–23)
CO2: 33 mmol/L — ABNORMAL HIGH (ref 22–32)
Calcium: 9.3 mg/dL (ref 8.9–10.3)
Chloride: 102 mmol/L (ref 98–111)
Creatinine: 1.19 mg/dL — ABNORMAL HIGH (ref 0.44–1.00)
GFR, Estimated: 45 mL/min — ABNORMAL LOW (ref >60)
Glucose, Bld: 92 mg/dL (ref 70–99)
Potassium: 3.9 mmol/L (ref 3.5–5.1)
Sodium: 141 mmol/L (ref 135–145)
Total Bilirubin: 0.8 mg/dL (ref 0.0–1.2)
Total Protein: 7.3 g/dL (ref 6.5–8.1)

## 2023-06-07 ENCOUNTER — Telehealth: Payer: Self-pay | Admitting: Nurse Practitioner

## 2023-06-07 NOTE — Telephone Encounter (Signed)
Patient is aware of scheduled appointment times/dates

## 2023-06-08 DIAGNOSIS — N183 Chronic kidney disease, stage 3 unspecified: Secondary | ICD-10-CM | POA: Diagnosis not present

## 2023-06-08 DIAGNOSIS — Z6835 Body mass index (BMI) 35.0-35.9, adult: Secondary | ICD-10-CM | POA: Diagnosis not present

## 2023-06-08 DIAGNOSIS — M109 Gout, unspecified: Secondary | ICD-10-CM | POA: Diagnosis not present

## 2023-06-08 DIAGNOSIS — N281 Cyst of kidney, acquired: Secondary | ICD-10-CM | POA: Diagnosis not present

## 2023-06-08 DIAGNOSIS — I129 Hypertensive chronic kidney disease with stage 1 through stage 4 chronic kidney disease, or unspecified chronic kidney disease: Secondary | ICD-10-CM | POA: Diagnosis not present

## 2023-06-09 LAB — LAB REPORT - SCANNED: Creatinine, POC: 127.1 mg/dL

## 2023-06-11 ENCOUNTER — Encounter: Payer: Self-pay | Admitting: Nurse Practitioner

## 2023-06-21 ENCOUNTER — Telehealth: Payer: Self-pay | Admitting: Nurse Practitioner

## 2023-06-21 NOTE — Telephone Encounter (Signed)
 Left patient a voicemail in regards to rescheduled appointment times/dates; left patient callback number if needing to reschedule or make changes to scheduled appointment

## 2023-07-11 ENCOUNTER — Other Ambulatory Visit: Payer: Self-pay | Admitting: Cardiovascular Disease

## 2023-07-11 DIAGNOSIS — Z170 Estrogen receptor positive status [ER+]: Secondary | ICD-10-CM

## 2023-07-13 ENCOUNTER — Other Ambulatory Visit (HOSPITAL_COMMUNITY): Payer: Self-pay

## 2023-07-13 ENCOUNTER — Other Ambulatory Visit: Payer: Self-pay

## 2023-07-13 MED ORDER — METOPROLOL TARTRATE 50 MG PO TABS
50.0000 mg | ORAL_TABLET | Freq: Two times a day (BID) | ORAL | 0 refills | Status: DC
  Filled 2023-07-13: qty 180, 90d supply, fill #0

## 2023-07-13 NOTE — Telephone Encounter (Signed)
 Dr. Erin Hearing pt. Sheis requesting a refill of this. This is not a cardiac RX. Does Dr. Royann Shivers want to refill?

## 2023-07-13 NOTE — Telephone Encounter (Signed)
 OK for metoprolol . Defer tamoxifen to PCP or oncologist

## 2023-07-14 ENCOUNTER — Other Ambulatory Visit (HOSPITAL_COMMUNITY): Payer: Self-pay

## 2023-07-14 ENCOUNTER — Other Ambulatory Visit: Payer: Self-pay | Admitting: Cardiovascular Disease

## 2023-07-14 DIAGNOSIS — I25708 Atherosclerosis of coronary artery bypass graft(s), unspecified, with other forms of angina pectoris: Secondary | ICD-10-CM

## 2023-07-14 DIAGNOSIS — E7800 Pure hypercholesterolemia, unspecified: Secondary | ICD-10-CM

## 2023-07-14 DIAGNOSIS — I4819 Other persistent atrial fibrillation: Secondary | ICD-10-CM

## 2023-07-14 MED ORDER — RIVAROXABAN 15 MG PO TABS: 15.0000 mg | ORAL_TABLET | Freq: Every evening | ORAL | 1 refills | Status: DC

## 2023-07-14 MED ORDER — REPATHA SURECLICK 140 MG/ML ~~LOC~~ SOAJ: 140.0000 mg | SUBCUTANEOUS | 0 refills | Status: DC

## 2023-07-14 MED ORDER — METOPROLOL TARTRATE 50 MG PO TABS: 50.0000 mg | ORAL_TABLET | Freq: Two times a day (BID) | ORAL | 0 refills | Status: DC

## 2023-07-14 NOTE — Telephone Encounter (Signed)
 Prescription refill request for Xarelto received.  Indication: Afib  Last office visit: 07/20/22 (Croitoru)  Weight: 67.9kg Age: 83 Scr: 1.19 (06/05/23)  CrCl: 37.85ml/min  Appropriate dose. Refill sent.

## 2023-07-14 NOTE — Telephone Encounter (Signed)
 Called pt to inform her that she needed to contact her PCP for a refill on medications allopurinol and tamoxifen, because they were not cardiac medications and Dr. Royann Shivers did not prescribe them. Pt verbalized understanding. Pt other medication sent to pt's pharmacy as requested. Confirmation received.

## 2023-07-14 NOTE — Telephone Encounter (Signed)
*  STAT* If patient is at the pharmacy, call can be transferred to refill team.   1. Which medications need to be refilled? (please list name of each medication and dose if known) new prescriptions for Allopurinol, Metoprolol, Repatha SureClick, Rivaroxaban,and Tamoxifen   2. Would you like to learn more about the convenience, safety, & potential cost savings by using the [facility]?    3. Are you open to using the [facility] (Type [facility].    4. Which pharmacy/location (including street and city if local pharmacy) is medication to be sent to?[facility] [address] [place]  5. Do they need a 30 day or 90 day supply? 90 days and refills

## 2023-07-17 ENCOUNTER — Telehealth: Payer: Self-pay | Admitting: Pharmacy Technician

## 2023-07-17 ENCOUNTER — Other Ambulatory Visit (HOSPITAL_COMMUNITY): Payer: Self-pay

## 2023-07-17 ENCOUNTER — Other Ambulatory Visit: Payer: Self-pay

## 2023-07-17 DIAGNOSIS — I25708 Atherosclerosis of coronary artery bypass graft(s), unspecified, with other forms of angina pectoris: Secondary | ICD-10-CM

## 2023-07-17 DIAGNOSIS — E7800 Pure hypercholesterolemia, unspecified: Secondary | ICD-10-CM

## 2023-07-17 NOTE — Telephone Encounter (Signed)
 Spoke to patient Advised she will need fasting lipid panel to have approval for Repatha.Stated she will have done this week.I will make Dr.Croitoru aware.

## 2023-07-17 NOTE — Telephone Encounter (Signed)
 Called patient left message on personal voice mail PA for Repatha requires a LDL cholesterol.Advised patient to call back.

## 2023-07-17 NOTE — Telephone Encounter (Signed)
 Prior Authorization form/request asks a question that requires your assistance. Please see the question below and advise accordingly. The PA will not be submitted until the necessary information is received.  Patient needs updated labs-last labs were not within the last 4 months

## 2023-07-17 NOTE — Addendum Note (Signed)
 Addended by: Neoma Laming on: 07/17/2023 03:27 PM   Modules accepted: Orders

## 2023-07-17 NOTE — Telephone Encounter (Signed)
 Pharmacy Patient Advocate Encounter   Received notification from CoverMyMeds that prior authorization for Repatha is required/requested.   Insurance verification completed.   The patient is insured through [facility] ADVANTAGE/RX ADVANCE .   Per test claim: PA required; PA submitted to above mentioned insurance via CoverMyMeds Key/confirmation #/EOC [facility] Status is pending

## 2023-07-18 DIAGNOSIS — I25708 Atherosclerosis of coronary artery bypass graft(s), unspecified, with other forms of angina pectoris: Secondary | ICD-10-CM | POA: Diagnosis not present

## 2023-07-18 DIAGNOSIS — E7800 Pure hypercholesterolemia, unspecified: Secondary | ICD-10-CM | POA: Diagnosis not present

## 2023-07-19 ENCOUNTER — Encounter: Payer: Self-pay | Admitting: Cardiovascular Disease

## 2023-07-19 LAB — LIPID PANEL
Chol/HDL Ratio: 3 ratio (ref 0.0–4.4)
Cholesterol, Total: 210 mg/dL — ABNORMAL HIGH (ref 100–199)
HDL: 71 mg/dL (ref >39)
LDL Chol Calc (NIH): 115 mg/dL — ABNORMAL HIGH (ref 0–99)
Triglycerides: 140 mg/dL (ref 0–149)
VLDL Cholesterol Cal: 24 mg/dL (ref 5–40)

## 2023-07-21 ENCOUNTER — Other Ambulatory Visit (HOSPITAL_COMMUNITY): Payer: Self-pay

## 2023-07-21 ENCOUNTER — Telehealth: Payer: Self-pay | Admitting: Cardiovascular Disease

## 2023-07-21 ENCOUNTER — Telehealth: Payer: Self-pay | Admitting: Pharmacy Technician

## 2023-07-21 DIAGNOSIS — I25708 Atherosclerosis of coronary artery bypass graft(s), unspecified, with other forms of angina pectoris: Secondary | ICD-10-CM

## 2023-07-21 DIAGNOSIS — E7800 Pure hypercholesterolemia, unspecified: Secondary | ICD-10-CM

## 2023-07-21 NOTE — Telephone Encounter (Signed)
 Pharmacy Patient Advocate Encounter   Received notification from Pt Calls Messages that prior authorization for Repatha is required/requested.   Insurance verification completed.   The patient is insured through [facility] ADVANTAGE/RX ADVANCE .   Per test claim: PA required; PA submitted to above mentioned insurance via CoverMyMeds Key/confirmation #/EOC  [facility] Status is pending

## 2023-07-21 NOTE — Telephone Encounter (Signed)
 Pt c/o medication issue:  1. Name of Medication:   Evolocumab (REPATHA SURECLICK) 140 MG/ML SOAJ   2. How are you currently taking this medication (dosage and times per day)?   3. Are you having a reaction (difficulty breathing--STAT)?   4. What is your medication issue?   Caller Claris Che) is following up on patient's prior authorization for this medication.   Caller provider Reference# [id number]

## 2023-07-21 NOTE — Telephone Encounter (Signed)
 Pharmacy Patient Advocate Encounter  Received notification from [facility] ADVANTAGE/RX ADVANCE that Prior Authorization for Repatha has been APPROVED from 07/21/23 to 01/17/24. Ran test claim, Copay is $47.00- one month. This test claim was processed through [facility]- copay amounts may vary at other pharmacies due to pharmacy/plan contracts, or as the patient moves through the different stages of their insurance plan.   PA #/Case ID/Reference #: [id number]

## 2023-07-24 MED ORDER — REPATHA SURECLICK 140 MG/ML ~~LOC~~ SOAJ: 140.0000 mg | SUBCUTANEOUS | 11 refills | Status: DC

## 2023-07-24 NOTE — Telephone Encounter (Signed)
 Informed the patient that her Repatha PA was approved. I will send in a refill.  Talked about results- cholesterol was higher than in the past. The patient states that she missed a few doses of Repatha.  Instructed to come back in 3 months to repeat lab work, will mail her a slip.   Verbalized understanding

## 2023-07-31 ENCOUNTER — Ambulatory Visit
Admission: RE | Admit: 2023-07-31 | Discharge: 2023-07-31 | Disposition: A | Discharge status: Home / Self Care | Source: Ambulatory Visit | Attending: Nurse Practitioner | Admitting: Nurse Practitioner

## 2023-07-31 DIAGNOSIS — C50411 Malignant neoplasm of upper-outer quadrant of right female breast: Secondary | ICD-10-CM

## 2023-07-31 DIAGNOSIS — R921 Mammographic calcification found on diagnostic imaging of breast: Secondary | ICD-10-CM | POA: Diagnosis not present

## 2023-08-01 ENCOUNTER — Other Ambulatory Visit: Payer: Self-pay | Admitting: Cardiovascular Disease

## 2023-08-01 DIAGNOSIS — C50411 Malignant neoplasm of upper-outer quadrant of right female breast: Secondary | ICD-10-CM
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
## 2023-09-04 ENCOUNTER — Ambulatory Visit: Attending: Cardiovascular Disease | Admitting: Cardiovascular Disease

## 2023-09-04 ENCOUNTER — Encounter: Payer: Self-pay | Admitting: Cardiovascular Disease

## 2023-09-04 VITALS — BP 134/70 | HR 70 | Ht 62.0 in | Wt 149.2 lb

## 2023-09-04 DIAGNOSIS — N1832 Chronic kidney disease, stage 3b: Secondary | ICD-10-CM | POA: Diagnosis not present

## 2023-09-04 DIAGNOSIS — Z79899 Other long term (current) drug therapy: Secondary | ICD-10-CM | POA: Diagnosis not present

## 2023-09-04 DIAGNOSIS — D6869 Other thrombophilia: Secondary | ICD-10-CM

## 2023-09-04 DIAGNOSIS — I10 Essential (primary) hypertension: Secondary | ICD-10-CM

## 2023-09-04 DIAGNOSIS — I4821 Permanent atrial fibrillation: Secondary | ICD-10-CM

## 2023-09-04 DIAGNOSIS — E7800 Pure hypercholesterolemia, unspecified: Secondary | ICD-10-CM

## 2023-09-04 DIAGNOSIS — I340 Nonrheumatic mitral (valve) insufficiency: Secondary | ICD-10-CM

## 2023-09-04 DIAGNOSIS — I5032 Chronic diastolic (congestive) heart failure: Secondary | ICD-10-CM | POA: Diagnosis not present

## 2023-09-04 DIAGNOSIS — I25708 Atherosclerosis of coronary artery bypass graft(s), unspecified, with other forms of angina pectoris: Secondary | ICD-10-CM | POA: Diagnosis not present

## 2023-09-04 MED ORDER — SPIRONOLACTONE 25 MG PO TABS: 12.5000 mg | ORAL_TABLET | Freq: Every day | ORAL | 3 refills | Status: DC

## 2023-09-04 MED ORDER — FUROSEMIDE 20 MG PO TABS: 40.0000 mg | ORAL_TABLET | Freq: Every day | ORAL | 3 refills | Status: DC

## 2023-09-04 NOTE — Patient Instructions (Signed)
 Medication Instructions:  Increase Furosemide  to 40 mg daily Start Spironolactone to 12.5 mg daily *If you need a refill on your cardiac medications before your next appointment, please call your pharmacy*  Lab Work: CMP, Lipid panel- 6 weeks If you have labs (blood work) drawn today and your tests are completely normal, you will receive your results only by: MyChart Message (if you have MyChart) OR A paper copy in the mail If you have any lab test that is abnormal or we need to change your treatment, we will call you to review the results.  Follow-Up: At [facility], you and your health needs are our priority.  As part of our continuing mission to provide you with exceptional heart care, our providers are all part of one team.  This team includes your primary Cardiologist (physician) and Advanced Practice Providers or APPs (Physician Assistants and Nurse Practitioners) who all work together to provide you with the care you need, when you need it.  Your next appointment:   1 year(s)  Provider:   Luana Rumple, MD    We recommend signing up for the patient portal called "MyChart".  Sign up information is provided on this After Visit Summary.  MyChart is used to connect with patients for Virtual Visits (Telemedicine).  Patients are able to view lab/test results, encounter notes, upcoming appointments, etc.  Non-urgent messages can be sent to your provider as well.   To learn more about what you can do with MyChart, go to [email or web address].

## 2023-09-06 ENCOUNTER — Other Ambulatory Visit (HOSPITAL_COMMUNITY): Payer: Self-pay | Admitting: Nephrology

## 2023-09-06 ENCOUNTER — Encounter: Payer: Self-pay | Admitting: Cardiovascular Disease

## 2023-09-06 DIAGNOSIS — N183 Chronic kidney disease, stage 3 unspecified: Secondary | ICD-10-CM

## 2023-09-14 ENCOUNTER — Ambulatory Visit (HOSPITAL_COMMUNITY)
Admission: RE | Admit: 2023-09-14 | Discharge: 2023-09-14 | Disposition: A | Discharge status: Home / Self Care | Source: Ambulatory Visit | Attending: Nephrology | Admitting: Nephrology

## 2023-09-14 DIAGNOSIS — N183 Chronic kidney disease, stage 3 unspecified: Secondary | ICD-10-CM | POA: Diagnosis not present

## 2023-09-14 DIAGNOSIS — N189 Chronic kidney disease, unspecified: Secondary | ICD-10-CM | POA: Diagnosis not present

## 2023-10-02 ENCOUNTER — Inpatient Hospital Stay (HOSPITAL_COMMUNITY)
Admission: EM | Admit: 2023-10-02 | Discharge: 2023-10-24 | DRG: 064 | Disposition: E | Attending: Family Medicine | Admitting: Family Medicine

## 2023-10-02 ENCOUNTER — Emergency Department (HOSPITAL_COMMUNITY)

## 2023-10-02 ENCOUNTER — Other Ambulatory Visit: Payer: Self-pay

## 2023-10-02 ENCOUNTER — Encounter (HOSPITAL_COMMUNITY): Payer: Self-pay

## 2023-10-02 DIAGNOSIS — I2510 Atherosclerotic heart disease of native coronary artery without angina pectoris: Secondary | ICD-10-CM | POA: Diagnosis present

## 2023-10-02 DIAGNOSIS — I4821 Permanent atrial fibrillation: Secondary | ICD-10-CM | POA: Diagnosis present

## 2023-10-02 DIAGNOSIS — Z885 Allergy status to narcotic agent status: Secondary | ICD-10-CM

## 2023-10-02 DIAGNOSIS — Z515 Encounter for palliative care: Secondary | ICD-10-CM | POA: Diagnosis not present

## 2023-10-02 DIAGNOSIS — R4182 Altered mental status, unspecified: Secondary | ICD-10-CM | POA: Diagnosis not present

## 2023-10-02 DIAGNOSIS — R0902 Hypoxemia: Secondary | ICD-10-CM | POA: Diagnosis not present

## 2023-10-02 DIAGNOSIS — I4891 Unspecified atrial fibrillation: Secondary | ICD-10-CM

## 2023-10-02 DIAGNOSIS — Z66 Do not resuscitate: Secondary | ICD-10-CM | POA: Diagnosis present

## 2023-10-02 DIAGNOSIS — N1831 Chronic kidney disease, stage 3a: Secondary | ICD-10-CM | POA: Diagnosis not present

## 2023-10-02 DIAGNOSIS — G8104 Flaccid hemiplegia affecting left nondominant side: Secondary | ICD-10-CM | POA: Diagnosis present

## 2023-10-02 DIAGNOSIS — I129 Hypertensive chronic kidney disease with stage 1 through stage 4 chronic kidney disease, or unspecified chronic kidney disease: Secondary | ICD-10-CM | POA: Diagnosis present

## 2023-10-02 DIAGNOSIS — R1111 Vomiting without nausea: Secondary | ICD-10-CM | POA: Diagnosis not present

## 2023-10-02 DIAGNOSIS — R000 Tachycardia, unspecified: Secondary | ICD-10-CM | POA: Diagnosis present

## 2023-10-02 DIAGNOSIS — R471 Dysarthria and anarthria: Secondary | ICD-10-CM | POA: Diagnosis present

## 2023-10-02 DIAGNOSIS — I739 Peripheral vascular disease, unspecified: Secondary | ICD-10-CM | POA: Diagnosis present

## 2023-10-02 DIAGNOSIS — Z79810 Long term (current) use of selective estrogen receptor modulators (SERMs): Secondary | ICD-10-CM

## 2023-10-02 DIAGNOSIS — I63521 Cerebral infarction due to unspecified occlusion or stenosis of right anterior cerebral artery: Secondary | ICD-10-CM | POA: Diagnosis not present

## 2023-10-02 DIAGNOSIS — Z7189 Other specified counseling: Secondary | ICD-10-CM | POA: Diagnosis not present

## 2023-10-02 DIAGNOSIS — R4701 Aphasia: Secondary | ICD-10-CM | POA: Diagnosis present

## 2023-10-02 DIAGNOSIS — E785 Hyperlipidemia, unspecified: Secondary | ICD-10-CM | POA: Diagnosis present

## 2023-10-02 DIAGNOSIS — R29718 NIHSS score 18: Secondary | ICD-10-CM | POA: Diagnosis not present

## 2023-10-02 DIAGNOSIS — I639 Cerebral infarction, unspecified: Secondary | ICD-10-CM | POA: Diagnosis not present

## 2023-10-02 DIAGNOSIS — G936 Cerebral edema: Secondary | ICD-10-CM | POA: Diagnosis not present

## 2023-10-02 DIAGNOSIS — Z86718 Personal history of other venous thrombosis and embolism: Secondary | ICD-10-CM

## 2023-10-02 DIAGNOSIS — I6503 Occlusion and stenosis of bilateral vertebral arteries: Secondary | ICD-10-CM | POA: Diagnosis not present

## 2023-10-02 DIAGNOSIS — I616 Nontraumatic intracerebral hemorrhage, multiple localized: Secondary | ICD-10-CM | POA: Diagnosis present

## 2023-10-02 DIAGNOSIS — Z803 Family history of malignant neoplasm of breast: Secondary | ICD-10-CM

## 2023-10-02 DIAGNOSIS — Z9049 Acquired absence of other specified parts of digestive tract: Secondary | ICD-10-CM

## 2023-10-02 DIAGNOSIS — W19XXXA Unspecified fall, initial encounter: Secondary | ICD-10-CM | POA: Diagnosis not present

## 2023-10-02 DIAGNOSIS — Z8041 Family history of malignant neoplasm of ovary: Secondary | ICD-10-CM

## 2023-10-02 DIAGNOSIS — I63511 Cerebral infarction due to unspecified occlusion or stenosis of right middle cerebral artery: Principal | ICD-10-CM | POA: Diagnosis present

## 2023-10-02 DIAGNOSIS — R29810 Facial weakness: Secondary | ICD-10-CM | POA: Diagnosis present

## 2023-10-02 DIAGNOSIS — Z85819 Personal history of malignant neoplasm of unspecified site of lip, oral cavity, and pharynx: Secondary | ICD-10-CM

## 2023-10-02 DIAGNOSIS — I10 Essential (primary) hypertension: Secondary | ICD-10-CM | POA: Diagnosis present

## 2023-10-02 DIAGNOSIS — I6601 Occlusion and stenosis of right middle cerebral artery: Secondary | ICD-10-CM | POA: Diagnosis not present

## 2023-10-02 DIAGNOSIS — Z79899 Other long term (current) drug therapy: Secondary | ICD-10-CM

## 2023-10-02 DIAGNOSIS — N183 Chronic kidney disease, stage 3 unspecified: Secondary | ICD-10-CM | POA: Diagnosis present

## 2023-10-02 DIAGNOSIS — Z8249 Family history of ischemic heart disease and other diseases of the circulatory system: Secondary | ICD-10-CM

## 2023-10-02 DIAGNOSIS — I6782 Cerebral ischemia: Secondary | ICD-10-CM | POA: Diagnosis not present

## 2023-10-02 DIAGNOSIS — Z951 Presence of aortocoronary bypass graft: Secondary | ICD-10-CM

## 2023-10-02 DIAGNOSIS — I672 Cerebral atherosclerosis: Secondary | ICD-10-CM | POA: Diagnosis not present

## 2023-10-02 DIAGNOSIS — N1832 Chronic kidney disease, stage 3b: Secondary | ICD-10-CM | POA: Diagnosis present

## 2023-10-02 DIAGNOSIS — Z888 Allergy status to other drugs, medicaments and biological substances status: Secondary | ICD-10-CM

## 2023-10-02 DIAGNOSIS — Z7901 Long term (current) use of anticoagulants: Secondary | ICD-10-CM

## 2023-10-02 DIAGNOSIS — G9340 Encephalopathy, unspecified: Secondary | ICD-10-CM | POA: Diagnosis not present

## 2023-10-02 DIAGNOSIS — I6523 Occlusion and stenosis of bilateral carotid arteries: Secondary | ICD-10-CM | POA: Diagnosis not present

## 2023-10-02 LAB — DIFFERENTIAL
Abs Immature Granulocytes: 0.04 10*3/uL (ref 0.00–0.07)
Basophils Absolute: 0 10*3/uL (ref 0.0–0.1)
Basophils Relative: 0 %
Eosinophils Absolute: 0 10*3/uL (ref 0.0–0.5)
Eosinophils Relative: 0 %
Immature Granulocytes: 0 %
Lymphocytes Relative: 7 %
Lymphs Abs: 0.7 10*3/uL (ref 0.7–4.0)
Monocytes Absolute: 0.4 10*3/uL (ref 0.1–1.0)
Monocytes Relative: 4 %
Neutro Abs: 8.5 10*3/uL — ABNORMAL HIGH (ref 1.7–7.7)
Neutrophils Relative %: 89 %

## 2023-10-02 LAB — COMPREHENSIVE METABOLIC PANEL WITH GFR
ALT: 18 U/L (ref 0–44)
AST: 38 U/L (ref 15–41)
Albumin: 3 g/dL — ABNORMAL LOW (ref 3.5–5.0)
Alkaline Phosphatase: 62 U/L (ref 38–126)
Anion gap: 14 (ref 5–15)
BUN: 22 mg/dL (ref 8–23)
CO2: 21 mmol/L — ABNORMAL LOW (ref 22–32)
Calcium: 8.6 mg/dL — ABNORMAL LOW (ref 8.9–10.3)
Chloride: 103 mmol/L (ref 98–111)
Creatinine, Ser: 0.99 mg/dL (ref 0.44–1.00)
GFR, Estimated: 56 mL/min — ABNORMAL LOW (ref >60)
Glucose, Bld: 203 mg/dL — ABNORMAL HIGH (ref 70–99)
Potassium: 3.3 mmol/L — ABNORMAL LOW (ref 3.5–5.1)
Sodium: 138 mmol/L (ref 135–145)
Total Bilirubin: 1.2 mg/dL (ref 0.0–1.2)
Total Protein: 6.4 g/dL — ABNORMAL LOW (ref 6.5–8.1)

## 2023-10-02 LAB — CBC
HCT: 37 % (ref 36.0–46.0)
Hemoglobin: 12.2 g/dL (ref 12.0–15.0)
MCH: 31.4 pg (ref 26.0–34.0)
MCHC: 33 g/dL (ref 30.0–36.0)
MCV: 95.1 fL (ref 80.0–100.0)
Platelets: 163 10*3/uL (ref 150–400)
RBC: 3.89 MIL/uL (ref 3.87–5.11)
RDW: 14.4 % (ref 11.5–15.5)
WBC: 9.6 10*3/uL (ref 4.0–10.5)
nRBC: 0 % (ref 0.0–0.2)

## 2023-10-02 LAB — PROTIME-INR
INR: 1.6 — ABNORMAL HIGH (ref 0.8–1.2)
Prothrombin Time: 19.5 s — ABNORMAL HIGH (ref 11.4–15.2)

## 2023-10-02 LAB — ETHANOL: Alcohol, Ethyl (B): <15 mg/dL (ref <15)

## 2023-10-02 LAB — APTT: aPTT: 33 s (ref 24–36)

## 2023-10-02 MED ORDER — IOHEXOL 350 MG/ML SOLN
40.0000 mL | Freq: Once | INTRAVENOUS | Status: AC | PRN
  Administered 2023-10-02: 40 mL via INTRAVENOUS

## 2023-10-02 MED ORDER — IOHEXOL 350 MG/ML SOLN
75.0000 mL | Freq: Once | INTRAVENOUS | Status: AC | PRN
  Administered 2023-10-02: 75 mL via INTRAVENOUS

## 2023-10-02 NOTE — ED Notes (Signed)
 Patient transported to CT
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
## 2023-10-02 NOTE — ED Triage Notes (Signed)
 Patient from home for altered mental status. 911 called out for welfare check(patient lives alone), last spoken to "this morning". EMS reports patient was found lying in the floor, appears to have fallen off of a step stool onto her back. EMS reports patient vomited en route, and patient appears to have urinated on herself. Upon arrival to ER, patient is alert, oriented to person, place, and time. ER MD at bedside, patient flaccid on L side, L side facial droop noted, and fixed gaze to the R. Stroke alert called at 2250. Patient taken to CT

## 2023-10-02 NOTE — ED Notes (Signed)
Dr Rory Percy paged to Dr Sabra Heck @ [phone]

## 2023-10-02 NOTE — ED Provider Notes (Signed)
 [place] EMERGENCY DEPARTMENT AT [facility] Provider Note   CSN: [id number] Arrival date & time: 10/02/23  2241     History {Add pertinent medical, surgical, social history, OB history to HPI:1} Chief Complaint  Patient presents with   Altered Mental Status    Sandra Bradford is a 83 y.o. female.   Altered Mental Status    This patient is a critically ill-appearing 83 year old female, she has a known history of hypertension on metoprolol , suspected breast cancer on tamoxifen , and according to the medical record the patient has been treated for hypertensive chronic kidney disease, some chronic low back pain, last seen in 2023 by oncology for breast cancer.  There is no other information available at this time.  I have tried to call all of the patient's contacts on her demographic sheet including her 2 daughters and her son but nobody answers the phone.  The patient evidently had a visit from law enforcement for a welfare check, evidently she was not answering her phone, the paramedics were called when the patient was found on the floor, the patient was not answering questions correctly, she seemed to be weak, the paramedics thought that she was altered and tried to put a cervical collar on her because she was found beside a stepstool thinking that she might of fallen.  Because she was vomiting profusely on the ambulance they took the cervical collar off.  The patient cannot give me any history of what occurred.  Evidently somebody had spoken with her this morning but we do not know who that was or what the conversation was like.  Family arrived within 15 minutes, they report that the patient was last seen normal at 3:00 PM when a grandchild was at the house talking to her, she was totally normal at that time  Home Medications Prior to Admission medications   Medication Sig Start Date End Date Taking? Authorizing Provider  acetaminophen  (TYLENOL ) 325 MG tablet Take 650 mg by mouth  every 6 (six) hours as needed for mild pain, moderate pain, fever or headache.  Patient not taking: Reported on 09/04/2023    [provider]  allopurinol (ZYLOPRIM) 100 MG tablet Take 100 mg by mouth daily. Patient not taking: Reported on 09/04/2023 01/24/19   [provider]  colchicine 0.6 MG tablet Take 0.6 mg by mouth daily as needed (gout).  Patient not taking: Reported on 09/04/2023    [provider]  Evolocumab  (REPATHA  SURECLICK) 140 MG/ML SOAJ Inject 140 mg into the skin every 14 (fourteen) days. 07/24/23   Croitoru, Mihai, MD  furosemide  (LASIX ) 20 MG tablet Take 2 tablets (40 mg total) by mouth daily. 09/04/23   Croitoru, Mihai, MD  metoprolol  tartrate (LOPRESSOR ) 50 MG tablet Take 1 tablet (50 mg total) by mouth 2 (two) times daily. 07/14/23   Croitoru, Mihai, MD  Multiple Vitamin (MULITIVITAMIN WITH MINERALS) TABS Take 1 tablet by mouth daily.    [provider]  Rivaroxaban  (XARELTO ) 15 MG TABS tablet Take 1 tablet (15 mg total) by mouth every evening. 07/14/23   Croitoru, Mihai, MD  spironolactone  (ALDACTONE ) 25 MG tablet Take 0.5 tablets (12.5 mg total) by mouth daily. 09/04/23   Croitoru, Mihai, MD  tamoxifen  (NOLVADEX ) 20 MG tablet Take 1 tablet (20 mg total) by mouth every morning. Patient not taking: Reported on 09/04/2023 04/17/23   Croitoru, Karyl Paget, MD      Allergies    Codeine and Statins    Review of Systems  Review of Systems  Unable to perform ROS: Acuity of condition    Physical Exam Updated Vital Signs LMP 04/25/1978  Physical Exam Vitals and nursing note reviewed.  Constitutional:      General: She is in acute distress.     Appearance: She is well-developed. She is ill-appearing.  HENT:     Head: Normocephalic and atraumatic.     Mouth/Throat:     Pharynx: No oropharyngeal exudate.  Eyes:     General: No scleral icterus.       Right eye: No discharge.        Left eye: No discharge.     Conjunctiva/sclera: Conjunctivae  normal.     Pupils: Pupils are equal, round, and reactive to light.  Neck:     Thyroid : No thyromegaly.     Vascular: No JVD.  Cardiovascular:     Rate and Rhythm: Normal rate and regular rhythm.     Heart sounds: Normal heart sounds. No murmur heard.    No friction rub. No gallop.  Pulmonary:     Effort: Pulmonary effort is normal. No respiratory distress.     Breath sounds: Normal breath sounds. No wheezing or rales.  Abdominal:     General: Bowel sounds are normal. There is no distension.     Palpations: Abdomen is soft. There is no mass.     Tenderness: There is no abdominal tenderness.  Musculoskeletal:        General: No tenderness. Normal range of motion.     Cervical back: Normal range of motion and neck supple.     Comments: Bruising along the left arm  Lymphadenopathy:     Cervical: No cervical adenopathy.  Skin:    General: Skin is warm and dry.     Findings: No erythema or rash.  Neurological:     Coordination: Coordination normal.     Comments: The patient has difficulty answering questions, she is somnolent to obtunded but arousable to painful stimuli, she has a dense hemiparesis of the left arm and the left leg with left-sided facial droop.  She has left hemineglect  Psychiatric:        Behavior: Behavior normal.     ED Results / Procedures / Treatments   Labs (all labs ordered are listed, but only abnormal results are displayed) Labs Reviewed - No data to display  EKG None  Radiology No results found.  Procedures .Critical Care  Performed by: Early Glisson, MD Authorized by: Early Glisson, MD   Critical care provider statement:    Critical care time (minutes):  75   Critical care time was exclusive of:  Separately billable procedures and treating other patients   Critical care was necessary to treat or prevent imminent or life-threatening deterioration of the following conditions:  CNS failure or compromise   Critical care was time spent personally  by me on the following activities:  Development of treatment plan with patient or surrogate, discussions with consultants, evaluation of patient's response to treatment, examination of patient, obtaining history from patient or surrogate, review of old charts, re-evaluation of patient's condition, pulse oximetry, ordering and review of radiographic studies, ordering and review of laboratory studies and ordering and performing treatments and interventions   I assumed direction of critical care for this patient from another provider in my specialty: no     Care discussed with: admitting provider   Comments:         {Document cardiac monitor, telemetry assessment procedure when appropriate:1}  Medications Ordered in ED Medications - No data to display  ED Course/ Medical Decision Making/ A&P   {   Click here for ABCD2, HEART and other calculatorsREFRESH Note before signing :1}                              Medical Decision Making Amount and/or Complexity of Data Reviewed Labs: ordered. Radiology: ordered.  Risk Prescription drug management. Decision regarding hospitalization.    This patient presents to the ED for concern of acute neurologic abnormality, this involves an extensive number of treatment options, and is a complaint that carries with it a high risk of complications and morbidity.  The differential diagnosis includes stroke, hemorrhage, seizure   Co morbidities / Chronic conditions that complicate the patient evaluation  She is on Xarelto , history of atrial fibrillation, history of blood clots   Additional history obtained:  Additional history obtained from EMR External records from outside source obtained and reviewed including family members as well, they report prior history of cardiac surgery 20 years ago, atrial fibrillation on Xarelto    Lab Tests:  I Ordered, and personally interpreted labs.  The pertinent results include:  ***   Imaging Studies  ordered:  I ordered imaging studies including CT scan shows large area large territory ischemia I independently visualized and interpreted imaging which showed angiogram also showed that the patient had M1 occlusion I agree with the radiologist interpretation   Cardiac Monitoring: / EKG:  The patient was maintained on a cardiac monitor.  I personally viewed and interpreted the cardiac monitored which showed an underlying rhythm of: Atrial fibrillation, mild tachycardia   Problem List / ED Course / Critical interventions / Medication management  This patient is critically ill Perfusion study shows I have reviewed the patients home medicines and have made adjustments as needed   Consultations Obtained:  I requested consultation with the Neurologist Dr. Arora,  and discussed lab and imaging findings as well as pertinent plan - they recommend: ***   Social Determinants of Health:  Prior heart disease Anticoagulated on xarelto    Test / Admission - Considered:  Will need admission to high level of care.   {Document critical care time when appropriate:1} {Document review of labs and clinical decision tools ie heart score, Chads2Vasc2 etc:1}  {Document your independent review of radiology images, and any outside records:1} {Document your discussion with family members, caretakers, and with consultants:1} {Document social determinants of health affecting pt's care:1} {Document your decision making why or why not admission, treatments were needed:1} Final Clinical Impression(s) / ED Diagnoses Final diagnoses:  None    Rx / DC Orders ED Discharge Orders     None

## 2023-10-02 NOTE — ED Notes (Signed)
 Per family at bedside, grandson last saw patient at 1500 today

## 2023-10-03 ENCOUNTER — Inpatient Hospital Stay (HOSPITAL_COMMUNITY)

## 2023-10-03 ENCOUNTER — Encounter (HOSPITAL_COMMUNITY): Payer: Self-pay | Admitting: Family Medicine

## 2023-10-03 DIAGNOSIS — Z7901 Long term (current) use of anticoagulants: Secondary | ICD-10-CM | POA: Diagnosis not present

## 2023-10-03 DIAGNOSIS — I639 Cerebral infarction, unspecified: Secondary | ICD-10-CM | POA: Diagnosis not present

## 2023-10-03 DIAGNOSIS — Z951 Presence of aortocoronary bypass graft: Secondary | ICD-10-CM | POA: Diagnosis not present

## 2023-10-03 DIAGNOSIS — Z8249 Family history of ischemic heart disease and other diseases of the circulatory system: Secondary | ICD-10-CM | POA: Diagnosis not present

## 2023-10-03 DIAGNOSIS — Z7189 Other specified counseling: Secondary | ICD-10-CM | POA: Diagnosis not present

## 2023-10-03 DIAGNOSIS — R4182 Altered mental status, unspecified: Secondary | ICD-10-CM | POA: Diagnosis present

## 2023-10-03 DIAGNOSIS — I63521 Cerebral infarction due to unspecified occlusion or stenosis of right anterior cerebral artery: Secondary | ICD-10-CM | POA: Diagnosis not present

## 2023-10-03 DIAGNOSIS — Z79810 Long term (current) use of selective estrogen receptor modulators (SERMs): Secondary | ICD-10-CM | POA: Diagnosis not present

## 2023-10-03 DIAGNOSIS — Z885 Allergy status to narcotic agent status: Secondary | ICD-10-CM | POA: Diagnosis not present

## 2023-10-03 DIAGNOSIS — E785 Hyperlipidemia, unspecified: Secondary | ICD-10-CM | POA: Diagnosis not present

## 2023-10-03 DIAGNOSIS — I129 Hypertensive chronic kidney disease with stage 1 through stage 4 chronic kidney disease, or unspecified chronic kidney disease: Secondary | ICD-10-CM | POA: Diagnosis not present

## 2023-10-03 DIAGNOSIS — Z79899 Other long term (current) drug therapy: Secondary | ICD-10-CM | POA: Diagnosis not present

## 2023-10-03 DIAGNOSIS — G8104 Flaccid hemiplegia affecting left nondominant side: Secondary | ICD-10-CM | POA: Diagnosis not present

## 2023-10-03 DIAGNOSIS — Z86718 Personal history of other venous thrombosis and embolism: Secondary | ICD-10-CM | POA: Diagnosis not present

## 2023-10-03 DIAGNOSIS — Z803 Family history of malignant neoplasm of breast: Secondary | ICD-10-CM | POA: Diagnosis not present

## 2023-10-03 DIAGNOSIS — G936 Cerebral edema: Secondary | ICD-10-CM | POA: Diagnosis not present

## 2023-10-03 DIAGNOSIS — Z515 Encounter for palliative care: Secondary | ICD-10-CM | POA: Diagnosis not present

## 2023-10-03 DIAGNOSIS — I4821 Permanent atrial fibrillation: Secondary | ICD-10-CM | POA: Diagnosis not present

## 2023-10-03 DIAGNOSIS — I616 Nontraumatic intracerebral hemorrhage, multiple localized: Secondary | ICD-10-CM | POA: Diagnosis not present

## 2023-10-03 DIAGNOSIS — G9340 Encephalopathy, unspecified: Secondary | ICD-10-CM | POA: Diagnosis not present

## 2023-10-03 DIAGNOSIS — I63511 Cerebral infarction due to unspecified occlusion or stenosis of right middle cerebral artery: Secondary | ICD-10-CM | POA: Diagnosis not present

## 2023-10-03 DIAGNOSIS — Z66 Do not resuscitate: Secondary | ICD-10-CM | POA: Diagnosis not present

## 2023-10-03 DIAGNOSIS — I739 Peripheral vascular disease, unspecified: Secondary | ICD-10-CM | POA: Diagnosis not present

## 2023-10-03 DIAGNOSIS — N1832 Chronic kidney disease, stage 3b: Secondary | ICD-10-CM | POA: Diagnosis not present

## 2023-10-03 DIAGNOSIS — I2510 Atherosclerotic heart disease of native coronary artery without angina pectoris: Secondary | ICD-10-CM | POA: Diagnosis not present

## 2023-10-03 DIAGNOSIS — R4701 Aphasia: Secondary | ICD-10-CM | POA: Diagnosis not present

## 2023-10-03 DIAGNOSIS — R29718 NIHSS score 18: Secondary | ICD-10-CM | POA: Diagnosis not present

## 2023-10-03 LAB — BASIC METABOLIC PANEL WITH GFR
Anion gap: 12 (ref 5–15)
BUN: 21 mg/dL (ref 8–23)
CO2: 26 mmol/L (ref 22–32)
Calcium: 8.7 mg/dL — ABNORMAL LOW (ref 8.9–10.3)
Chloride: 102 mmol/L (ref 98–111)
Creatinine, Ser: 1.02 mg/dL — ABNORMAL HIGH (ref 0.44–1.00)
GFR, Estimated: 54 mL/min — ABNORMAL LOW (ref >60)
Glucose, Bld: 172 mg/dL — ABNORMAL HIGH (ref 70–99)
Potassium: 3.7 mmol/L (ref 3.5–5.1)
Sodium: 140 mmol/L (ref 135–145)

## 2023-10-03 LAB — CBC
HCT: 40.2 % (ref 36.0–46.0)
Hemoglobin: 13.2 g/dL (ref 12.0–15.0)
MCH: 30.9 pg (ref 26.0–34.0)
MCHC: 32.8 g/dL (ref 30.0–36.0)
MCV: 94.1 fL (ref 80.0–100.0)
Platelets: 167 10*3/uL (ref 150–400)
RBC: 4.27 MIL/uL (ref 3.87–5.11)
RDW: 14.3 % (ref 11.5–15.5)
WBC: 10.5 10*3/uL (ref 4.0–10.5)
nRBC: 0 % (ref 0.0–0.2)

## 2023-10-03 LAB — RAPID URINE DRUG SCREEN, HOSP PERFORMED
Amphetamines: NOT DETECTED
Barbiturates: NOT DETECTED
Benzodiazepines: NOT DETECTED
Cocaine: NOT DETECTED
Opiates: NOT DETECTED
Tetrahydrocannabinol: NOT DETECTED

## 2023-10-03 LAB — LIPID PANEL
Cholesterol: 177 mg/dL (ref 0–200)
HDL: 65 mg/dL (ref >40)
LDL Cholesterol: 100 mg/dL — ABNORMAL HIGH (ref 0–99)
Total CHOL/HDL Ratio: 2.7 ratio
Triglycerides: 58 mg/dL (ref <150)
VLDL: 12 mg/dL (ref 0–40)

## 2023-10-03 MED ORDER — ONDANSETRON HCL 4 MG/2ML IJ SOLN
4.0000 mg | Freq: Once | INTRAMUSCULAR | Status: AC
  Administered 2023-10-03: 4 mg via INTRAVENOUS
  Filled 2023-10-03: qty 2

## 2023-10-03 MED ORDER — MORPHINE SULFATE (PF) 2 MG/ML IV SOLN
2.0000 mg | INTRAVENOUS | Status: DC | PRN
  Administered 2023-10-03 (×2): 4 mg via INTRAVENOUS
  Filled 2023-10-03 (×2): qty 2

## 2023-10-03 MED ORDER — SODIUM CHLORIDE 0.9 % IV SOLN: INTRAVENOUS | Status: DC

## 2023-10-03 MED ORDER — ASPIRIN 81 MG PO TBEC: 81.0000 mg | DELAYED_RELEASE_TABLET | Freq: Every day | ORAL | Status: DC

## 2023-10-03 MED ORDER — DEXTROSE-SODIUM CHLORIDE 5-0.45 % IV SOLN: INTRAVENOUS | Status: DC

## 2023-10-03 MED ORDER — ACETAMINOPHEN 160 MG/5ML PO SOLN: 650.0000 mg | ORAL | Status: DC | PRN

## 2023-10-03 MED ORDER — GLYCOPYRROLATE 0.2 MG/ML IJ SOLN: 0.2000 mg | INTRAMUSCULAR | Status: DC | PRN

## 2023-10-03 MED ORDER — ACETAMINOPHEN 325 MG PO TABS: 650.0000 mg | ORAL_TABLET | ORAL | Status: DC | PRN

## 2023-10-03 MED ORDER — GLYCOPYRROLATE 0.2 MG/ML IJ SOLN
0.6000 mg | INTRAMUSCULAR | Status: DC
  Administered 2023-10-03 – 2023-10-04 (×8): 0.6 mg via INTRAVENOUS
  Filled 2023-10-03 (×8): qty 3

## 2023-10-03 MED ORDER — MORPHINE SULFATE (PF) 4 MG/ML IV SOLN
4.0000 mg | Freq: Once | INTRAVENOUS | Status: AC
  Administered 2023-10-03: 4 mg via INTRAVENOUS
  Filled 2023-10-03: qty 1

## 2023-10-03 MED ORDER — GLYCOPYRROLATE 1 MG PO TABS: 1.0000 mg | ORAL_TABLET | ORAL | Status: DC | PRN

## 2023-10-03 MED ORDER — ACETAMINOPHEN 650 MG RE SUPP
650.0000 mg | RECTAL | Status: DC | PRN
  Filled 2023-10-03: qty 1

## 2023-10-03 MED ORDER — LORAZEPAM 2 MG/ML IJ SOLN
1.0000 mg | INTRAMUSCULAR | Status: DC | PRN
  Administered 2023-10-03 – 2023-10-08 (×8): 1 mg via INTRAVENOUS
  Filled 2023-10-03 (×8): qty 1

## 2023-10-03 MED ORDER — SCOPOLAMINE 1 MG/3DAYS TD PT72
1.0000 | MEDICATED_PATCH | TRANSDERMAL | Status: DC
  Administered 2023-10-03 – 2023-10-06 (×2): 1.5 mg via TRANSDERMAL
  Filled 2023-10-03 (×2): qty 1

## 2023-10-03 MED ORDER — STROKE: EARLY STAGES OF RECOVERY BOOK
Freq: Once | Status: DC
  Filled 2023-10-03: qty 1

## 2023-10-03 MED ORDER — ONDANSETRON HCL 4 MG/2ML IJ SOLN
4.0000 mg | Freq: Four times a day (QID) | INTRAMUSCULAR | Status: DC | PRN
  Administered 2023-10-03: 4 mg via INTRAVENOUS
  Filled 2023-10-03: qty 2

## 2023-10-03 NOTE — ED Notes (Signed)
 Pt oxygen level 85% on RA. RT notified to come access patient for further respiratory management.

## 2023-10-03 NOTE — Evaluation (Signed)
 Occupational Therapy Evaluation Patient Details Name: Sandra Bradford MRN: [id number] DOB: 03/23/1941 Today's Date: 10/03/2023   History of Present Illness   Sandra Bradford is an 83 y.o. female with medical history significant for hypertension, hyperlipidemia, CAD status post CABG, permanent atrial fibrillation on Xarelto , history of breast cancer, and CKD 3B who presents with left-sided weakness after she was found down at home.     Patient was seen in her usual state at approximately 3 PM today and was later found down with left-sided weakness and decreased level of consciousness.  At her baseline, patient lives independently and performs all of her ADLs on her own. (per MD)     Clinical Impressions Pt lethargic initially but increased arousal once standing. Family in the room providing living history. Pt was independent living alone prior to CVA. Pt now required max A for bed mobility and transfer to chair with RW. Pt demonstrates L UE hemiplegia with no active movement noted, but pt did seem to maintain grip slightly on RW with L UE during mobility. Pt also presents with L visual neglect with neck rotation to R side. Difficult to fully assess vision at this time. Max to total assist for lower body ADL's and Max A for upper body. Pt left in the chair with family and MD present. Pt will benefit from continued OT in the hospital and recommended venue below to increase strength, balance, and endurance for safe ADL's.        If plan is discharge home, recommend the following:   Two people to help with walking and/or transfers;A lot of help with bathing/dressing/bathroom;Assistance with cooking/housework;Assistance with feeding;Direct supervision/assist for medications management;Assist for transportation;Help with stairs or ramp for entrance     Functional Status Assessment   Patient has had a recent decline in their functional status and demonstrates the ability to make significant improvements  in function in a reasonable and predictable amount of time.     Equipment Recommendations   None recommended by OT     Recommendations for Other Services   Rehab consult     Precautions/Restrictions   Precautions Precautions: Fall Recall of Precautions/Restrictions: Impaired Restrictions Weight Bearing Restrictions Per Provider Order: No     Mobility Bed Mobility Overal bed mobility: Needs Assistance Bed Mobility: Supine to Sit     Supine to sit: Max assist     General bed mobility comments: assist to move B LE to EOB and raise trunk to sit    Transfers Overall transfer level: Needs assistance Equipment used: Rolling walker (2 wheels) Transfers: Sit to/from Stand, Bed to chair/wheelchair/BSC Sit to Stand: Max assist Stand pivot transfers: Max assist         General transfer comment: 2 to 3 reps of sit to stand to move closer to chair; very unsteady with +2 assist with second person assisting most with maintaining grasp on RW with L UE. Poor ability to advance L LE.      Balance Overall balance assessment: Needs assistance Sitting-balance support: Single extremity supported, Feet supported Sitting balance-Leahy Scale: Poor Sitting balance - Comments: poor to fair seated at EOB   Standing balance support: Bilateral upper extremity supported, During functional activity, Reliant on assistive device for balance Standing balance-Leahy Scale: Poor Standing balance comment: using RW                           ADL either performed or assessed with clinical judgement  ADL Overall ADL's : Needs assistance/impaired Eating/Feeding: Moderate assistance;Sitting   Grooming: Maximal assistance;Sitting   Upper Body Bathing: Maximal assistance;Sitting   Lower Body Bathing: Maximal assistance;Total assistance;Bed level   Upper Body Dressing : Maximal assistance;Sitting   Lower Body Dressing: Maximal assistance;Total assistance;Bed level   Toilet  Transfer: Maximal assistance;Stand-pivot;Rolling walker (2 wheels) Toilet Transfer Details (indicate cue type and reason): Simualted via EOB to chair transfer with RW Toileting- Clothing Manipulation and Hygiene: Total assistance;Maximal assistance;Bed level   Tub/ Shower Transfer: Maximal assistance;Stand-pivot   Functional mobility during ADLs: Maximal assistance;Total assistance;+2 for physical assistance;Rolling walker (2 wheels)       Vision Baseline Vision/History: 1 Wears glasses Ability to See in Adequate Light: 1 Impaired Patient Visual Report: Other (comment) (No reports but pt clearly gazing off to R side with neck rotation to R side as well.) Vision Assessment?: Vision impaired- to be further tested in functional context;Yes Alignment/Gaze Preference: Gaze right;Head turned Tracking/Visual Pursuits: Impaired - to be further tested in functional context Visual Fields: Left visual field deficit Additional Comments: More testing needed. Pt difficult to assess at this time but shows clear singsl of L visual vield neglect/loss.     Perception Perception: Not tested       Praxis Praxis: Not tested       Pertinent Vitals/Pain Pain Assessment Pain Assessment: Faces Faces Pain Scale: Hurts little more Pain Location: B LE Pain Descriptors / Indicators: Discomfort, Guarding Pain Intervention(s): Limited activity within patient's tolerance, Monitored during session, Repositioned     Extremity/Trunk Assessment Upper Extremity Assessment Upper Extremity Assessment: RUE deficits/detail;LUE deficits/detail RUE Deficits / Details: 3-/5 shoulder flexion; generally weak otherwise. RUE Sensation: WNL RUE Coordination: decreased gross motor LUE Deficits / Details: Flaccid. ~75% P/ROM for shoulder flexion; able to grasp RW with assist but not trace grasp noted during supine testing. LUE Sensation: WNL LUE Coordination: decreased fine motor;decreased gross motor   Lower Extremity  Assessment Lower Extremity Assessment: Defer to PT evaluation   Cervical / Trunk Assessment Cervical / Trunk Assessment: Kyphotic   Communication Communication Communication: Impaired Factors Affecting Communication: Difficulty expressing self;Other (comment) (slurred speech)   Cognition Arousal: Lethargic Behavior During Therapy: Anxious Cognition: Cognition impaired             OT - Cognition Comments: Pt able to converse with family but difficult to understand at this time. Some difficulty following commands for visual assessment but this may be more related to lethargy.                 Following commands: Impaired       Cueing  General Comments   Cueing Techniques: Verbal cues;Tactile cues                 Home Living Family/patient expects to be discharged to:: Private residence Living Arrangements: Alone Available Help at Discharge: Family;Available PRN/intermittently Type of Home: House Home Access: Ramped entrance     Home Layout: Two level;Able to live on main level with bedroom/bathroom     Bathroom Shower/Tub: Chief Strategy Officer: Standard Bathroom Accessibility: No   Home Equipment: Cane - single point          Prior Functioning/Environment Prior Level of Function : Independent/Modified Independent             Mobility Comments: Tourist information centre manager without AD ADLs Comments: Independent    OT Problem List: Decreased strength;Decreased range of motion;Decreased activity tolerance;Impaired balance (sitting and/or standing);Impaired vision/perception;Decreased coordination;Decreased cognition;Decreased safety awareness;Impaired  sensation;Decreased knowledge of use of DME or AE;Impaired UE functional use;Pain   OT Treatment/Interventions: Self-care/ADL training;Therapeutic exercise;Neuromuscular education;DME and/or AE instruction;Energy conservation;Therapeutic activities;Cognitive  remediation/compensation;Visual/perceptual remediation/compensation;Patient/family education;Balance training      OT Goals(Current goals can be found in the care plan section)   Acute Rehab OT Goals Patient Stated Goal: improve function OT Goal Formulation: With family Time For Goal Achievement: 10/17/23 Potential to Achieve Goals: Fair   OT Frequency:  Min 2X/week    Co-evaluation PT/OT/SLP Co-Evaluation/Treatment: Yes Reason for Co-Treatment: To address functional/ADL transfers;Complexity of the patient's impairments (multi-system involvement)   OT goals addressed during session: ADL's and self-care                       End of Session Equipment Utilized During Treatment: Rolling walker (2 wheels);Gait belt  Activity Tolerance: Patient tolerated treatment well Patient left: in chair;with call bell/phone within reach;with family/visitor present  OT Visit Diagnosis: Unsteadiness on feet (R26.81);Other abnormalities of gait and mobility (R26.89);Muscle weakness (generalized) (M62.81);History of falling (Z91.81);Other symptoms and signs involving the nervous system (R29.898);Other symptoms and signs involving cognitive function;Cognitive communication deficit (R41.841);Hemiplegia and hemiparesis Symptoms and signs involving cognitive functions: Cerebral infarction Hemiplegia - Right/Left: Left Hemiplegia - caused by: Cerebral infarction                Time: 0981-1914 OT Time Calculation (min): 23 min Charges:  OT General Charges $OT Visit: 1 Visit OT Evaluation $OT Eval Moderate Complexity: 1 Mod  Mechel Haggard OT, MOT  [employer] 10/03/2023, 9:51 AM

## 2023-10-03 NOTE — Progress Notes (Signed)
 Code stroke activated: 2256 Stroke cart rolled to CT where patient was being scanned, arrived at 2358. Paged Telespecialst MD At 2300  LKW: "sometime this morning" per RN Provider at bedside: not witnessed upon activation. Returned to room at 2307; Dr. Annabell Key shared rad results. Family clarified LKW was 1500 today. Dr. Bevin Bucks connected at 2310. Report given to Dr. Bevin Bucks and she was ready to see patient at 2314.  Dr. Bonnita Buttner seeing patient on the other stroke cart at this time and Dr. Bevin Bucks was told that she was no longer needed.  I remained on cart for Dr. Margarite Shearer exam. Patient back to CT for CTP.  Not accepted for intervention.    Leni Quiet, Telestroke RN

## 2023-10-03 NOTE — Consult Note (Addendum)
 Consultation Note Date: 10/03/2023   Patient Name: Sandra Bradford  DOB: 1940-12-31  MRN: [id number]  Age / Sex: 83 y.o., female  PCP: Sandra Amel, MD Referring Physician: Bobbetta Burnet, MD  Reason for Consultation: Establishing goals of care  HPI/Patient Profile: 83 y.o. female  with past medical history of hypertension, hyperlipidemia (on Repatha , statin myopathy), CAD status post CABG, permanent atrial fibrillation on Xarelto , PAD, history of breast cancer (on tamoxifen ), and CKD 3B  admitted on 10/02/2023 with left-sided weakness after being found down at home.   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  Further workup including perfusion study revealed very large core with only small area of penumbra to save therefore, not a candidate for thrombectomy and outside of the window for tPA. Failed bedside swallow screen. PT/OT consulted. MRI reviewed and concerning for large acute right MCA and ACA with associated cytotoxic edema and scattered petechial hemorrhage.  Mild intracranial mass effect with no midline shift.  PMT has been consulted to assist with goals of care conversation.  Clinical Assessment and Goals of Care:  I have reviewed medical records including EPIC notes, labs and imaging, outside specialist notes, assessed the patient and then met with daughters (Sandra Bradford and Sandra Bradford) at bedside to discuss diagnosis prognosis, GOC, EOL wishes, disposition and options.  I introduced Palliative Medicine as specialized medical care for people living with serious illness. It focuses on providing relief from the symptoms and stress of a serious illness. The goal is to improve quality of life for both the patient and the family.  We discussed a brief life review of the patient and then focused on their current illness.   I attempted to elicit values and goals of care important to the patient.    Medical History  Review and Family/Patient Understanding:   Daughters understand that patient has suffered from a very bad stroke which has affected 50 to 70% of the brain.  They are aware that it is unlikely she will be able to return to independent living.  They are aware that there is swelling of the brain and are hopeful that once the swelling improves she may have some improvement but that there will very likely be profound deficits. Hopeful for therapy to see what progress/improvements she can make.   Social History:  Patient comes from a close family.  She has 2 daughters and 1 son as well as numerous grandchildren and great-grandchildren.  They have been through a lot of loss recently including the loss of her son-in-law and her ex-husband.  The entire family, including the patient and her children have had extensive experience with caring for those with serious illness.  They share about the patient's ex-husband and how he suffered from a stroke and then vascular dementia and that after his experience as well as that of numerous other family members (including the patient's mother) that Sandra Bradford has been clear she would not want to go through what he went through (he was in a skilled nursing facility, incontinent, required specialized diet including thickened liquids, often not aware of his surroundings). Quality of life has always been important to her and she did not to have to have her children care for her.   Functional and Nutritional State:  At baseline, patient was living at home alone.  She was very functional and completed all of her ADLs and IADLs and could even occasionally be found mowing her lawn.  However, they  share that over the past 2-3 weeks she was having some more issues with confusion in the evenings.  She does have a life alert with fall detection and 2 way speaker system and family checked on her frequently.  Family felt that she has a fair appetite.  Typically ate 1 large meal and snacks  throughout the day. She had been less active lately.    Advance Directives:  A detailed discussion regarding advanced directives was had.  See below.  Patient has an established living will and healthcare power of attorney documentation.  Copies made and given to emergency department staff to be scanned to EMR.    Code Status:  Full code, daughters plan to discuss with patient's son about transition to DNR/DNI.  Daughters are in agreement with DNR/DNI just need to speak with the son to confirm.  Discussion:  Extensive discussion with family regarding patient's current illness in the context of her comorbidities including concern that she has had a very severe stroke and will likely be left with profound deficits.  Fortunately, family has had numerous discussions regarding care the patient would want and not want after watching several of her family members in their medical journey.  It seemed that she was pretty clear that quality of life and being able to be independent and live in her home is something that she values.  The family is hopeful that with time and therapy she will make some improvements however, they seem to have a realistic understanding that the improvements may be limited.  Anticipatory care needs were discussed.  While we hope, that patient will make some improvements with therapy, it is possible and even likely that her health could worsen and could do so rapidly.  We specifically discussed that if the patient's health were to take a turn for the worse, would it make sense for her, given her goals and wishes, to perform CPR, defibrillation, and intubation.  Both daughters feel that patient would not want this especially knowing that it would not reverse the neurologic damage that has already taken place.  Also discussed artificial feedings including both short and long term trajectories including at end of life. They are clear she would not want a long term feeding tube. Uncertain  about the temporary use of feeding tube. Offered to complete MOST and DNR forms.  However, they state they need to speak with their brother prior to making this decision.  They are very interested in having her follow-up with therapy and receiving further workup at [facility] and having time to determine outcomes. Also desire to have palliative care continue to follow up at [facility].   Discussed the importance of continued conversation with family and the medical providers regarding overall plan of care and treatment options, ensuring decisions are within the context of the patient's values and GOCs.   Questions and concerns were addressed.  The family was encouraged to call with questions or concerns.  PMT will continue to support holistically.   HCPOA (patient is unable to speak for herself right now), therefore, Sandra Bradford (daughter) and son Sherena Machorro) to make decisions.   Update: Received notification that patient had a rapid deterioration.  Increased heart rate and declining respiratory status.  EDP followed up with patient and family.  Due to rapid decline in previously expressed goals of care, family decided to transition her to DNR/comfort care only.  Plan is to remain at [facility] for end-of-life care.  Followed back up with patient  and family.  She appears to have increased work of breathing and more secretions.  Family confirms that the goal is to transition to comfort and confirm DNR status.  We discussed what comfort care would look like including initiation of comfort medicines. An plan to keep inpatient here at [facility] for EOL care.  Family is thankful for the follow-up and agreeable to POC as detailed above.    SUMMARY OF RECOMMENDATIONS    Updated: given decline in status transition to DNR/DNI, full comfort care Initiate symptom meds, see below Admit to inpatient for EOL care Full comfort measures Unrestricted visitors PMT to follow to ensure comfort   Code Status/Advance  Care Planning: Updated: DNR/DNI, comfort care only    Symptom Management (updated): Robinul  scheduled Ativan PRN Morphine  PRN Zofran  PRN  Psycho-social/Spiritual:  Desire for further Chaplaincy support: Chaplain aware of decompensation and plans to follow up  Prognosis:  Update: Hours-days  Discharge Planning: Update: Anticipate hospital death     Primary Diagnoses: Present on Admission:  Acute ischemic stroke (HCC)  Permanent atrial fibrillation (HCC)  Essential hypertension  CAD-CABG 2002, cath 06/11/14  CKD (chronic kidney disease) stage 3, GFR 30-59 ml/min (HCC)    Physical Exam Constitutional:      General: She is not in acute distress.    Appearance: She is ill-appearing.  Cardiovascular:     Rate and Rhythm: Rhythm irregular.  Pulmonary:     Effort: Pulmonary effort is normal. No respiratory distress.  Neurological:     Mental Status: She is lethargic.     Comments: Left sided weakness, max assist to get from chair to bed   Update: Increased work of breathing, tachycardic, increased secretions, eyes are open and will squeeze hand of family at bedside with right hand.   Vital Signs: BP (!) 151/90 (BP Location: Right Arm)   Pulse 99   Temp 97.7 F (36.5 C) (Axillary)   Resp 18   Ht 5\' 4"  (1.626 m)   Wt 67.7 kg   LMP 04/25/1978   SpO2 94%   BMI 25.62 kg/m  Pain Scale: 0-10   Pain Score: 0-No pain   SpO2: SpO2: 94 % O2 Device:SpO2: 94 % O2 Flow Rate: .O2 Flow Rate (L/min): 4 L/min   Palliative Assessment/Data: 10%     Total time: I spent 90 minutes in the care of the patient today in the above activities and documenting the encounter.   Render Carrie, NP  Palliative Medicine Team Team phone # [phone]  Thank you for allowing the Palliative Medicine Team to assist in the care of this patient. Please utilize secure chat with additional questions, if there is no response within 30 minutes please call the above phone  number.  Palliative Medicine Team providers are available by phone from 7am to 7pm daily and can be reached through the team cell phone.  Should this patient require assistance outside of these hours, please call the patient's attending physician.

## 2023-10-03 NOTE — ED Notes (Signed)
 Patient transported to CT

## 2023-10-03 NOTE — Plan of Care (Signed)
  Problem: Education: Goal: Knowledge of disease or condition will improve Outcome: Not Applicable Goal: Knowledge of secondary prevention will improve (MUST DOCUMENT ALL) Outcome: Not Applicable Goal: Knowledge of patient specific risk factors will improve (DELETE if not current risk factor) Outcome: Not Applicable

## 2023-10-03 NOTE — Hospital Course (Signed)
 Sandra Bradford is an 83 y.o. female with medical history significant for hypertension, hyperlipidemia, CAD status post CABG, permanent atrial fibrillation on Xarelto , history of breast cancer, and CKD 3B who presents with left-sided weakness after she was found down at home.   Patient was seen in her usual state at approximately 3 PM today and was later found down with left-sided weakness and decreased level of consciousness.  At her baseline, patient lives independently and performs all of her ADLs on her own.   ED Course: Upon arrival to the ED, patient is found to be afebrile and saturating upper 80s to 90s on room air with mild tachycardia and stable BP.  Labs are most notable for potassium 3.3, creatinine 0.99, and normal CBC.  CT is concerning for acute right MCA and right ACA distribution infarction.  CTA reveals acute distal right ACA occlusion and acute right MCA occlusion at the bifurcation.   Patient was evaluated by teleneurology in the ED and it was recommended that she be admitted to [facility] for further evaluation and management.

## 2023-10-03 NOTE — ED Notes (Addendum)
 Pt's O2 sat noted to be 83-85% on 5L O2 via [place] and HR 170bpm. RN went to assess pt. NRB placed on pt with no change in O2 sat. HR fluctuating between 125-180bpm. Dr. Scarlette Currier called to bedside. RT at bedside and suctioning pt.

## 2023-10-03 NOTE — Plan of Care (Signed)
  Problem: Acute Rehab OT Goals (only OT should resolve) Goal: Pt. Will Perform Eating Flowsheets (Taken 10/03/2023 0955) Pt Will Perform Eating:  with supervision  sitting Goal: Pt. Will Perform Grooming Flowsheets (Taken 10/03/2023 0955) Pt Will Perform Grooming:  with contact guard assist  sitting Goal: Pt. Will Perform Upper Body Dressing Flowsheets (Taken 10/03/2023 0955) Pt Will Perform Upper Body Dressing:  with min assist  sitting Goal: Pt. Will Perform Lower Body Dressing Flowsheets (Taken 10/03/2023 0955) Pt Will Perform Lower Body Dressing:  with mod assist  sitting/lateral leans Goal: Pt. Will Transfer To Toilet Flowsheets (Taken 10/03/2023 [phone]) Pt Will Transfer to Toilet:  with min assist  stand pivot transfer Goal: Pt. Will Perform Toileting-Clothing Manipulation Flowsheets (Taken 10/03/2023 0955) Pt Will Perform Toileting - Clothing Manipulation and hygiene:  with mod assist  sitting/lateral leans Goal: Pt/Caregiver Will Perform Home Exercise Program Flowsheets (Taken 10/03/2023 [phone]) Pt/caregiver will Perform Home Exercise Program:  Increased ROM  Increased strength  Right Upper extremity  Left upper extremity  With minimal assist  Gaspard Isbell OT, MOT

## 2023-10-03 NOTE — Progress Notes (Signed)
 PROGRESS NOTE    Patient: Sandra Bradford                            PCP: Minus Amel, MD                    DOB: October 10, 1940            DOA: 10/02/2023 PXT:[id number]             DOS: 10/03/2023, 11:30 AM   LOS: 0 days   Date of Service: The patient was seen and examined on 10/03/2023  Subjective:   The patient was seen and examined this morning. Medically stable, confused, mildly somnolent, easily arousable, following some command, aphasic, visible facial asymmetry, with left-sided weakness  Brief Narrative:   Sandra Bradford is an 83 y.o. female with medical history significant for hypertension, hyperlipidemia, CAD status post CABG, permanent atrial fibrillation on Xarelto , history of breast cancer, and CKD 3B who presents with left-sided weakness after she was found down at home.   Patient was seen in her usual state at approximately 3 PM today and was later found down with left-sided weakness and decreased level of consciousness.  At her baseline, patient lives independently and performs all of her ADLs on her own.   ED Course: Upon arrival to the ED, patient is found to be afebrile and saturating upper 80s to 90s on room air with mild tachycardia and stable BP.  Labs are most notable for potassium 3.3, creatinine 0.99, and normal CBC.  CT is concerning for acute right MCA and right ACA distribution infarction.  CTA reveals acute distal right ACA occlusion and acute right MCA occlusion at the bifurcation.   Patient was evaluated by teleneurology in the ED and it was recommended that she be admitted to [facility] for further evaluation and management.   Assessment & Plan:   Principal Problem:   Acute ischemic stroke [facility]) Active Problems:   CAD-CABG 2002, cath 06/11/14   Essential hypertension   CKD (chronic kidney disease) stage 3, GFR 30-59 ml/min (HCC)   Permanent atrial fibrillation (HCC)   Acute CVA  - Symptomatic, with significant facial asymmetry, nonverbal, aphasic,  left-sided weakness -Admitted over night continue close monitoring, neurochecks, -CT of head/MRI/MRA head and neck reviewed Subtotal Acute Right MCA and ACA territory infarcts with confluent cytotoxic edema and scattered petechial hemorrhage  - Pending echocardiogram -Follow-up with labs including A1c, -Consulted PT/OT/speech -N.p.o., bedside evaluation can high concern for aspiration-dysphagia - confluent cytotoxic edema and scattered petechial hemorrhage - NPO pending swallow screen, hold Xarelto ,  - Will order compliance with Xarelto  is questionable -Open the palliative care consultation     Atrial fibrillation  - Hold Xarelto  pending MRI brain right MCA/ACA territory infarct, with scattered petechial hemorrhage   Hypertension  - Permit permissive HTN for now    CKD 3A  - Appears to be at baseline  - Renally-dose medications      Ethics: Remain full code per daughter at bedside, open to palliative care consultation   -------------------------------------------------------------------------------------------------------------------- Nutritional status:  The patient's BMI is: Body mass index is 25.62 kg/m. I agree with the assessment and plan as   DVT prophylaxis:  SCD's Start: 10/03/23 0012   Code Status:   Code Status: Full Code  Family Communication: Daughter present at bedside, anxious, updated Still requesting patient to be transferred to [facility] based on discussion with neurologist overnight  -Advance  care planning has been discussed.   Admission status:   Status is: Inpatient Remains inpatient appropriate because: Needing close monitoring for acute stroke, stroke workup,   Disposition: From  - home             Planning for discharge in 1-2 days: To inpatient rehab (CIR)  Procedures:   No admission procedures for hospital encounter.   Antimicrobials:  Anti-infectives (From admission, onward)    None        Medication:   [START ON 10/04/2023]   stroke: early stages of recovery book   Does not apply Once   aspirin  EC  81 mg Oral Daily    acetaminophen  **OR** acetaminophen  (TYLENOL ) oral liquid 160 mg/5 mL **OR** acetaminophen , ondansetron  (ZOFRAN ) IV   Objective:   Vitals:   10/03/23 0600 10/03/23 0833 10/03/23 0930 10/03/23 1100  BP: (!) 161/81 (!) 157/80  (!) 151/90  Pulse: (!) 108 (!) 121 93 99  Resp: (!) 21  18 18   Temp:    97.7 F (36.5 C)  TempSrc:    Axillary  SpO2: 100% 98% 92% 94%  Weight:      Height:       No intake or output data in the 24 hours ending 10/03/23 1130 Filed Weights   10/02/23 2341  Weight: 67.7 kg     Physical examination:    General:  AAO x 1, asymmetric facial droop--aphasic but following commands  HEENT:  Normocephalic, PERRL, otherwise with in Normal limits   Neuro:  Somnolent, limited exam left-sided weakness, aphasic, deviating tongue to the right, asymmetric facial droop -  Lungs:   Clear to auscultation BL, Respirations unlabored,  No wheezes / crackles  Cardio:    S1/S2, irregularly irregular no murmure, No Rubs or Gallops   Abdomen:  Soft, non-tender, bowel sounds active all four quadrants, no guarding or peritoneal signs.  Muscular  skeletal:  Limited exam -global generalized weaknesses - in bed, able to move all 4 extremities,   2+ pulses,  symmetric, No pitting edema  Skin:  Dry, warm to touch, negative for any Rashes,  Wounds: Please see nursing documentation    --------------------------------------------------------------------------------------------------------------    LABs:     Latest Ref Rng & Units 10/03/2023    5:14 AM 10/02/2023   11:13 PM 06/05/2023   10:27 AM  CBC  WBC 4.0 - 10.5 K/uL 10.5  9.6  6.9   Hemoglobin 12.0 - 15.0 g/dL 40.9  81.1  91.4   Hematocrit 36.0 - 46.0 % 40.2  37.0  38.3   Platelets 150 - 400 K/uL 167  163  153       Latest Ref Rng & Units 10/03/2023    5:14 AM 10/02/2023   11:13 PM 06/05/2023   10:27 AM  CMP  Glucose 70 - 99  mg/dL 782  956  92   BUN 8 - 23 mg/dL 21  22  24    Creatinine 0.44 - 1.00 mg/dL 2.13  0.86  5.78   Sodium 135 - 145 mmol/L 140  138  141   Potassium 3.5 - 5.1 mmol/L 3.7  3.3  3.9   Chloride 98 - 111 mmol/L 102  103  102   CO2 22 - 32 mmol/L 26  21  33   Calcium 8.9 - 10.3 mg/dL 8.7  8.6  9.3   Total Protein 6.5 - 8.1 g/dL  6.4  7.3   Total Bilirubin 0.0 - 1.2 mg/dL  1.2  0.8  Alkaline Phos 38 - 126 U/L  62  66   AST 15 - 41 U/L  38  31   ALT 0 - 44 U/L  18  17        Micro Results No results found for this or any previous visit (from the past 240 hours).  Radiology Reports MR BRAIN WO CONTRAST Result Date: 10/03/2023 CLINICAL DATA:  83 year old female code stroke presentation yesterday. Acute right MCA, distal right ACA occlusion. Atrial fibrillation. On Xarelto . No thrombolysis given. EXAM: MRI HEAD WITHOUT CONTRAST TECHNIQUE: Multiplanar, multiecho pulse sequences of the brain and surrounding structures were obtained without intravenous contrast. COMPARISON:  CT head, CTA, CTP yesterday. FINDINGS: Brain: Subtotal right MCA and ACA territory restricted diffusion. Right PCA territory is spared. Confluent gyral T2 and FLAIR hyperintense cytotoxic edema. Similar edema in the right basal ganglia. Scattered petechial hemorrhage in both of the affected vascular territories, confluent in the right superior frontal and middle gyri. Mass effect on the right lateral ventricle but no midline shift at this time. No ventriculomegaly. Basilar cisterns remain patent. No left hemisphere or posterior fossa diffusion restriction. No chronic cortical encephalomalacia identified. No convincing chronic underlying cerebral blood products. Vascular: Major intracranial vascular flow voids are preserved. Skull and upper cervical spine: Negative. Sinuses/Orbits: Postoperative changes to both globes, otherwise negative. Other: Visible internal auditory structures appear normal. Negative visible scalp and face.  IMPRESSION: 1. Subtotal Acute Right MCA and ACA territory infarcts with confluent cytotoxic edema and scattered petechial hemorrhage. 2. No malignant hemorrhagic transformation. And only mild intracranial mass effect at this time with no midline shift. Electronically Signed   By: Marlise Simpers M.D.   On: 10/03/2023 07:37   CT CEREBRAL PERFUSION W CONTRAST Result Date: 10/02/2023 CLINICAL DATA:  Initial evaluation for acute stroke. EXAM: CT PERFUSION BRAIN TECHNIQUE: Multiphase CT imaging of the brain was performed following IV bolus contrast injection. Subsequent parametric perfusion maps were calculated using RAPID software. RADIATION DOSE REDUCTION: This exam was performed according to the departmental dose-optimization program which includes automated exposure control, adjustment of the mA and/or kV according to patient size and/or use of iterative reconstruction technique. CONTRAST:  40mL OMNIPAQUE  IOHEXOL  350 MG/ML SOLN COMPARISON:  CTs from earlier the same day FINDINGS: CT Brain Perfusion Findings: CBF (<30%) Volume: Perfusion (Tmax>6.0s) volume: Mismatch Volume: 58mL ASPECTS on noncontrast CT Head: 3 at 07/13/2007 today. Infarct Core: 165 mL Infarction Location:Acute right MCA and ACA distribution infarcts. 58 mL surrounding ischemic penumbra. 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 165 mL core infarct with 58 mL surrounding ischemic penumbra. Electronically Signed   By: Virgia Griffins M.D.   On: 10/02/2023 23:44   CT ANGIO HEAD NECK W WO CM (CODE STROKE) Result Date: 10/02/2023 CLINICAL DATA:  Initial evaluation for acute neuro deficit, stroke suspected. EXAM: CT ANGIOGRAPHY HEAD AND NECK WITH AND WITHOUT CONTRAST TECHNIQUE: Multidetector CT imaging of the head and neck was performed using the standard protocol during bolus administration of intravenous contrast. Multiplanar CT image reconstructions and MIPs were obtained to evaluate the vascular anatomy. Carotid stenosis  measurements (when applicable) are obtained utilizing NASCET criteria, using the distal internal carotid diameter as the denominator. RADIATION DOSE REDUCTION: This exam was performed according to the departmental dose-optimization program which includes automated exposure control, adjustment of the mA and/or kV according to patient size and/or use of iterative reconstruction technique. CONTRAST:  75mL OMNIPAQUE  IOHEXOL  350 MG/ML SOLN COMPARISON:  Head CT from earlier the same day. FINDINGS:  CTA NECK FINDINGS Aortic arch: Aortic arch within normal limits for caliber with standard 3 vessel morphology. Moderate aortic atherosclerosis. Sequelae of prior CABG. No significant stenosis about the origin the great vessels. Right carotid system: Right common and internal carotid arteries are patent without dissection. Calcified plaque about the right carotid bulb with associated stenosis of up to 50% by NASCET criteria. Left carotid system: Left common and internal carotid arteries are patent without dissection. Moderate atheromatous change about the left carotid bulb without hemodynamically significant greater than 50% stenosis. Vertebral arteries: Both vertebral arteries arise from the subclavian arteries. No proximal subclavian artery stenosis. Atheromatous change about the origins of both vertebral arteries with moderate stenoses bilaterally. Vertebral arteries otherwise patent without significant stenosis or dissection. Skeleton: No worrisome osseous lesions. Other neck: No other acute finding. 2.1 cm left thyroid  nodule. This has been previously evaluated by thyroid  ultrasound and biopsy, most recently in 2017 (ref: J Am Coll Radiol. 2015 Feb;12(2): 143-50). Upper chest: Moderate right with small left layering pleural effusions. Review of the MIP images confirms the above findings CTA HEAD FINDINGS Anterior circulation: Atheromatous change about the carotid siphons without hemodynamically significant stenosis. A1  segments patent bilaterally. Normal anterior communicating artery complex. Left ACA patent without stenosis. There is acute distal right ACA occlusion, A4 segment (a series 5, image 152). Left M1 segment and distal left MCA branches are patent well perfused. On the right, there is acute occlusion of the right MCA at the right MCA bifurcation/proximal right M2 segment (series 5, image 230). And anterior temporal branch remains patent. Posterior circulation: Both V4 segments patent without significant stenosis. Both PICA patent at their origins. Basilar diminutive but patent without stenosis. Superior cerebral arteries patent bilaterally. Fetal type origin of the PCAs bilaterally. Both PCAs patent to their distal aspects without significant stenosis. Venous sinuses: Patent allowing for timing the contrast bolus. Anatomic variants: As above.  No aneurysm. Review of the MIP images confirms the above findings IMPRESSION: 1. Acute distal right ACA occlusion, A4 segment. 2. Acute right MCA occlusion at the right MCA bifurcation/proximal right M2 segment. 3. Atheromatous change about the carotid bifurcations with associated stenosis of up to 50% on the right. 4. Moderate stenoses about the origins of both vertebral arteries. 5. Moderate right with small left layering pleural effusions. 6.  Aortic Atherosclerosis (ICD10-I70.0). Critical Value/emergent results were called by telephone at the time of interpretation on 10/02/2023 at 11:05 p.m. to provider [facility] , who verbally acknowledged these results. Electronically Signed   By: Virgia Griffins M.D.   On: 10/02/2023 23:25   CT HEAD CODE STROKE WO CONTRAST Result Date: 10/02/2023 CLINICAL DATA:  Code stroke. EXAM: CT HEAD WITHOUT CONTRAST TECHNIQUE: Contiguous axial images were obtained from the base of the skull through the vertex without intravenous contrast. RADIATION DOSE REDUCTION: This exam was performed according to the departmental dose-optimization program  which includes automated exposure control, adjustment of the mA and/or kV according to patient size and/or use of iterative reconstruction technique. COMPARISON:  Prior CT from 05/20/2021 FINDINGS: Brain: Generalized age-related cerebral atrophy with chronic small vessel ischemic disease. Evolving cytotoxic edema involving the right cerebral hemisphere, consistent with evolving acute right ACA and MCA distribution infarcts. No acute intracranial hemorrhage or significant regional mass effect at this time. Gray-white matter differentiation otherwise maintained. No mass lesion or midline shift. No hydrocephalus or extra-axial fluid collection. Vascular: Asymmetric density at the distal right M1 segment, suspicious for possible thrombus. Calcified atherosclerosis present at skull base.  Skull: Scalp soft tissues demonstrate no acute finding. Calvarium intact. Sinuses/Orbits: Globes orbital soft tissues within normal limits. Paranasal sinuses are largely clear. No significant mastoid effusion. Other: None. ASPECTS [facility] Stroke Program Early CT Score) - Ganglionic level infarction (caudate, lentiform nuclei, internal capsule, insula, M1-M3 cortex): 3 - Supraganglionic infarction (M4-M6 cortex): 0 Total score (0-10 with 10 being normal): 3 IMPRESSION: 1. Evolving acute right ACA and MCA distribution infarcts. No acute intracranial hemorrhage or significant regional mass effect at this time. 2. ASPECTS is 3 3. Underlying atrophy with chronic small vessel ischemic disease. Critical Value/emergent results were called by telephone at the time of interpretation on 10/02/2023 at 11:04 pm to provider [facility] , who verbally acknowledged these results. Electronically Signed   By: Virgia Griffins M.D.   On: 10/02/2023 23:09    SIGNED: Bobbetta Burnet, MD, FHM. FAAFP. Arlin Benes - [facility] Time spent - 55 min.  In seeing, evaluating and examining the patient. Reviewing medical records, labs, drawn plan of  care. [facility],  Pager (please use [email or web address] to page/ text) Please use Epic Secure Chat for non-urgent communication (7AM-7PM)  If 7PM-7AM, please contact night-coverage [email or web address], 10/03/2023, 11:30 AM

## 2023-10-03 NOTE — ED Notes (Signed)
 ED Provider at bedside.

## 2023-10-03 NOTE — ED Notes (Signed)
 Patient transported to MRI

## 2023-10-03 NOTE — TOC CM/SW Note (Signed)
 Transition of Care [facility]) - Inpatient Brief Assessment   Patient Details  Name: Sandra Bradford MRN: [id number] Date of Birth: November 20, 1940  Transition of Care [facility]) CM/SW Contact:    Grandville Lax, LCSWA Phone Number: 10/03/2023, 11:26 AM   Clinical Narrative: CSW notes PT is recommending CIR for pt at D/C. Pt will also be transferring to [facility] for further medical eval.  Transition of Care Department [facility]) has reviewed patient and no TOC needs have been identified at this time. We will continue to monitor patient advancement through interdiciplinary progression rounds. If new patient transition needs arise, please place a TOC consult.  Transition of Care Asessment: Insurance and Status: Insurance coverage has been reviewed Patient has primary care physician: Yes Home environment has been reviewed: From home Prior level of function:: Independent Prior/Current Home Services: No current home services Social Drivers of Health Review: SDOH reviewed no interventions necessary Readmission risk has been reviewed: Yes Transition of care needs: transition of care needs identified, TOC will continue to follow

## 2023-10-03 NOTE — Progress Notes (Signed)
 Sandra Bradford is an 83 y.o. female with medical history significant for hypertension, hyperlipidemia, CAD status post CABG, permanent atrial fibrillation on Xarelto , history of breast cancer, and CKD 3B who presents with left-sided weakness after she was found down at home   Acute CVA-with progressive signs and symptoms Facial droop, aphasia, significant left-sided weakness Patient was discovered acute right MCA and ACA territory infarct with confluent cytotoxic edema and scattered petechial hemorrhage   Blood pressure (!) 157/92, pulse (!) 119, temperature 97.7 F (36.5 C), temperature source Axillary, resp. rate (!) 42, height 5\' 4"  (1.626 m), weight 67.7 kg, last menstrual period 04/25/1978, SpO2 100%.  Patient is progressively declining, did not pass speech evaluation at bedside. Becoming more encephalopathic  EDP has been in discussion with the family-patient was pending admitted and transferred to [facility] they have decided to keep the patient at [facility] for family visit agreed pursuing palliative care and comfort care  Palliative care team was consulted-fruitful discussion, patient will remain at any pain under comfort care measures Confirmed DNR/DNI status   S/p extensive discussion with family members we agree with above  Patient will remain in any pain under comfort care measures.    SIGNED: Bobbetta Burnet, MD, FHM. FAAFP Total of additional 30 minutes were spent in care of patient, family meeting drawn plan of care discussion of goals of care.  [facility],  Pager (please use [email or web address] to page/text)  Please use Epic Secure Chat for non-urgent communication (7AM-7PM) If 7PM-7AM, please contact night-coverage [email or web address],  10/03/2023, 2:36 PM

## 2023-10-03 NOTE — ED Provider Notes (Signed)
 Pt has been in the ED waiting for a bed at [facility] and her respiratory status and HR worsened.  I suspect she has some swelling from her large stroke.  After discussion with pt's 2 daughters and son, we have all decided to make pt DNR comfort care only.  They request to stay here as that is easier for the family.  I have ordered morphine /ativan and comfort care orders.  Dr. Eilene Grater notified and will change the order to admit here.   Sueellen Emery, MD 10/03/23 [phone]

## 2023-10-03 NOTE — ED Notes (Signed)
Pt failed stroke swallow screen

## 2023-10-03 NOTE — Plan of Care (Signed)
  Problem: Acute Rehab PT Goals(only PT should resolve) Goal: Pt Will Go Supine/Side To Sit Outcome: Progressing Flowsheets (Taken 10/03/2023 1243) Pt will go Supine/Side to Sit: with moderate assist Goal: Patient Will Transfer Sit To/From Stand Outcome: Progressing Flowsheets (Taken 10/03/2023 1243) Patient will transfer sit to/from stand: with moderate assist Goal: Pt Will Transfer Bed To Chair/Chair To Bed Outcome: Progressing Flowsheets (Taken 10/03/2023 1243) Pt will Transfer Bed to Chair/Chair to Bed: with mod assist Goal: Pt Will Ambulate Outcome: Progressing Flowsheets (Taken 10/03/2023 1243) Pt will Ambulate:  10 feet  with moderate assist  with rolling walker   12:44 PM, 10/03/23 Walton Guppy, MPT Physical Therapist with [facility] [phone] office [phone] mobile phone

## 2023-10-03 NOTE — Progress Notes (Signed)
 Inpatient Rehab Admissions Coordinator Note:   Per therapy recommendations patient was screened for CIR candidacy by Mickey Alar, PT. At this time, pt appears to be a potential candidate for CIR. I will place an order for rehab consult for full assessment, per our protocol.  Please contact me any with questions.  Note will likely require 24/7 supervision/assist at home following rehabilitation given current deficits.   Loye Rumble, PT, DPT [phone] 10/03/23 11:19 AM
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
## 2023-10-03 NOTE — ED Notes (Addendum)
 Patient vomiting. Admitting MD made aware and request for PRN zofran 

## 2023-10-03 NOTE — Evaluation (Signed)
 Physical Therapy Evaluation Patient Details Name: Sandra Bradford MRN: [id number] DOB: Jan 16, 1941 Today's Date: 10/03/2023  History of Present Illness  Sandra Bradford is an 83 y.o. female with medical history significant for hypertension, hyperlipidemia, CAD status post CABG, permanent atrial fibrillation on Xarelto , history of breast cancer, and CKD 3B who presents with left-sided weakness after she was found down at home.     Patient was seen in her usual state at approximately 3 PM today and was later found down with left-sided weakness and decreased level of consciousness.  At her baseline, patient lives independently and performs all of her ADLs on her own. (per MD)   Clinical Impression  Patent presents with left sided weakness, mostly flaccid on left side, required Max assist for sitting up at bedside, very unsteady on feet with poor tolerance for taking steps due to weakness and leaning to the left, attempted transferring using RW, but unable to grip with left and and required Max assist stand pivot to transfer to chair with hand held assist. Patient tolerated sitting up in chair after therapy with family members present - RN aware. Patient will benefit from continued skilled physical therapy in hospital and recommended venue below to increase strength, balance, endurance for safe ADLs and gait.          If plan is discharge home, recommend the following: A lot of help with walking and/or transfers;A lot of help with bathing/dressing/bathroom;Assistance with cooking/housework;Help with stairs or ramp for entrance   Can travel by private vehicle        Equipment Recommendations Rolling walker (2 wheels)  Recommendations for Other Services       Functional Status Assessment Patient has had a recent decline in their functional status and demonstrates the ability to make significant improvements in function in a reasonable and predictable amount of time.     Precautions / Restrictions  Precautions Precautions: Fall Recall of Precautions/Restrictions: Impaired Restrictions Weight Bearing Restrictions Per Provider Order: No      Mobility  Bed Mobility Overal bed mobility: Needs Assistance Bed Mobility: Supine to Sit     Supine to sit: Max assist     General bed mobility comments: poor carryover for moving BLE due to weaknes, left sided paralysis    Transfers Overall transfer level: Needs assistance Equipment used: Rolling walker (2 wheels) Transfers: Sit to/from Stand, Bed to chair/wheelchair/BSC Sit to Stand: Max assist Stand pivot transfers: Max assist         General transfer comment: poor standing balance with mostly shuffling of feet due to weakness    Ambulation/Gait Ambulation/Gait assistance: Max assist Gait Distance (Feet): 2 Feet Assistive device: Rolling walker (2 wheels) Gait Pattern/deviations: Knees buckling, Shuffle, Decreased dorsiflexion - left, Decreased stance time - left, Decreased stride length, Decreased step length - right, Decreased step length - left Gait velocity: slow     General Gait Details: limited to a couple of shuffling side steps due to left sided weakness  Stairs            Wheelchair Mobility     Tilt Bed    Modified Rankin (Stroke Patients Only)       Balance Overall balance assessment: Needs assistance Sitting-balance support: Feet supported, Single extremity supported Sitting balance-Leahy Scale: Poor Sitting balance - Comments: fair/poor seated at EOB   Standing balance support: Bilateral upper extremity supported, During functional activity, Reliant on assistive device for balance Standing balance-Leahy Scale: Poor Standing balance comment: using RW  Pertinent Vitals/Pain Pain Assessment Pain Assessment: Faces Faces Pain Scale: Hurts little more Pain Location: BLE Pain Descriptors / Indicators: Discomfort, Grimacing, Guarding Pain  Intervention(s): Limited activity within patient's tolerance, Monitored during session, Repositioned    Home Living Family/patient expects to be discharged to:: Private residence Living Arrangements: Alone Available Help at Discharge: Family;Available PRN/intermittently Type of Home: House Home Access: Ramped entrance       Home Layout: Two level;Able to live on main level with bedroom/bathroom Home Equipment: Jeananne Mighty - single point      Prior Function Prior Level of Function : Independent/Modified Independent             Mobility Comments: Tourist information centre manager without AD ADLs Comments: Independent     Extremity/Trunk Assessment   Upper Extremity Assessment Upper Extremity Assessment: Defer to OT evaluation RUE Deficits / Details: 3-/5 shoulder flexion; generally weak otherwise. RUE Sensation: WNL RUE Coordination: decreased gross motor LUE Deficits / Details: Flaccid. ~75% P/ROM for shoulder flexion; able to grasp RW with assist but not trace grasp noted during supine testing. LUE Sensation: WNL LUE Coordination: decreased fine motor;decreased gross motor    Lower Extremity Assessment Lower Extremity Assessment: Generalized weakness;RLE deficits/detail;LLE deficits/detail RLE Deficits / Details: grossly -4/5 RLE Sensation: WNL RLE Coordination: WNL LLE Deficits / Details: grossly 1/5, mostly flaccid LLE Sensation: decreased proprioception;decreased light touch LLE Coordination: decreased fine motor;decreased gross motor    Cervical / Trunk Assessment Cervical / Trunk Assessment: Kyphotic  Communication   Communication Communication: Impaired Factors Affecting Communication: Difficulty expressing self;Other (comment) (slurred speech)    Cognition Arousal: Lethargic Behavior During Therapy: WFL for tasks assessed/performed, Anxious   PT - Cognitive impairments: Difficult to assess Difficult to assess due to: Impaired communication, Level of arousal                        Following commands: Impaired       Cueing Cueing Techniques: Verbal cues, Tactile cues     General Comments      Exercises     Assessment/Plan    PT Assessment Patient needs continued PT services  PT Problem List Decreased strength;Decreased activity tolerance;Decreased balance;Decreased mobility;Impaired sensation;Impaired tone;Pain       PT Treatment Interventions DME instruction;Gait training;Stair training;Functional mobility training;Therapeutic activities;Therapeutic exercise;Balance training;Patient/family education    PT Goals (Current goals can be found in the Care Plan section)  Acute Rehab PT Goals Patient Stated Goal: return home PT Goal Formulation: With patient/family Time For Goal Achievement: 10/17/23 Potential to Achieve Goals: Good    Frequency Min 3X/week     Co-evaluation PT/OT/SLP Co-Evaluation/Treatment: Yes Reason for Co-Treatment: To address functional/ADL transfers PT goals addressed during session: Mobility/safety with mobility;Balance;Proper use of DME OT goals addressed during session: ADL's and self-care       AM-PAC PT "6 Clicks" Mobility  Outcome Measure Help needed turning from your back to your side while in a flat bed without using bedrails?: A Lot Help needed moving from lying on your back to sitting on the side of a flat bed without using bedrails?: A Lot Help needed moving to and from a bed to a chair (including a wheelchair)?: A Lot Help needed standing up from a chair using your arms (e.g., wheelchair or bedside chair)?: A Lot Help needed to walk in hospital room?: Total Help needed climbing 3-5 steps with a railing? : Total 6 Click Score: 10    End of Session Equipment Utilized During Treatment: Gait belt Activity  Tolerance: Patient tolerated treatment well;Patient limited by fatigue;Patient limited by lethargy Patient left: in chair;with family/visitor present;with call bell/phone within reach Nurse  Communication: Mobility status PT Visit Diagnosis: Unsteadiness on feet (R26.81);Other abnormalities of gait and mobility (R26.89);Muscle weakness (generalized) (M62.81);Hemiplegia and hemiparesis Hemiplegia - Right/Left: Left Hemiplegia - dominant/non-dominant: Non-dominant Hemiplegia - caused by: Cerebral infarction    Time: 0836-0900 PT Time Calculation (min) (ACUTE ONLY): 24 min   Charges:   PT Evaluation $PT Eval Moderate Complexity: 1 Mod PT Treatments $Therapeutic Activity: 23-37 mins PT General Charges $$ ACUTE PT VISIT: 1 Visit         12:40 PM, 10/03/23 Walton Guppy, MPT Physical Therapist with [facility] [phone] office [phone] mobile phone

## 2023-10-03 NOTE — Evaluation (Signed)
 SLP Cancellation Note  Patient Details Name: Sandra Bradford MRN: [id number] DOB: July 17, 1940   Cancelled treatment:        SLP attempted to see pt for bedside swallow evaluation around 1200. Pt not appropriate for PO/eval due to reduced alertness and desaturation on RA with pt requiring supplemental O2 via NRB as SLP entered. SLP will re-attempt as schedule allows.  Caretha Chapel, MA CCC-SLP Speech-Language Pathologist 10/03/2023, 12:40 PM

## 2023-10-04 DIAGNOSIS — Z515 Encounter for palliative care: Secondary | ICD-10-CM | POA: Diagnosis not present

## 2023-10-04 DIAGNOSIS — Z7189 Other specified counseling: Secondary | ICD-10-CM | POA: Diagnosis not present

## 2023-10-04 DIAGNOSIS — I639 Cerebral infarction, unspecified: Secondary | ICD-10-CM | POA: Diagnosis not present

## 2023-10-04 LAB — HEMOGLOBIN A1C
Hgb A1c MFr Bld: 5.8 % — ABNORMAL HIGH (ref 4.8–5.6)
Mean Plasma Glucose: 120 mg/dL

## 2023-10-04 MED ORDER — MORPHINE BOLUS VIA INFUSION
2.0000 mg | INTRAVENOUS | Status: DC | PRN
  Administered 2023-10-06: 4 mg via INTRAVENOUS
  Administered 2023-10-06: 3 mg via INTRAVENOUS
  Administered 2023-10-06 (×7): 4 mg via INTRAVENOUS
  Administered 2023-10-06: 2 mg via INTRAVENOUS
  Administered 2023-10-06: 4 mg via INTRAVENOUS
  Administered 2023-10-06: 2 mg via INTRAVENOUS
  Administered 2023-10-08: 4 mg via INTRAVENOUS
  Administered 2023-10-09: 2 mg via INTRAVENOUS
  Administered 2023-10-09 (×2): 4 mg via INTRAVENOUS

## 2023-10-04 MED ORDER — MORPHINE 100MG IN NS 100ML (1MG/ML) PREMIX INFUSION
2.0000 mg/h | INTRAVENOUS | Status: DC
  Administered 2023-10-04 – 2023-10-05 (×3): 5 mg/h via INTRAVENOUS
  Administered 2023-10-06: 7 mg/h via INTRAVENOUS
  Administered 2023-10-06: 5 mg/h via INTRAVENOUS
  Administered 2023-10-07 – 2023-10-08 (×4): 7 mg/h via INTRAVENOUS
  Administered 2023-10-09: 9 mg/h via INTRAVENOUS
  Filled 2023-10-04 (×10): qty 100

## 2023-10-04 NOTE — Progress Notes (Signed)
 Daily Progress Note   Patient Name: Sandra Bradford       Date: 10/04/2023 DOB: November 12, 1940  Age: 83 y.o. MRN#: [id number] Attending Physician: Colin Dawley, MD Primary Care Physician: Minus Amel, MD Admit Date: 10/02/2023  Reason for Consultation/Follow-up: Terminal Care  Subjective:   83 y.o. female  with past medical history of hypertension, hyperlipidemia (on Repatha , statin myopathy), CAD status post CABG, permanent atrial fibrillation on Xarelto , PAD, history of breast cancer (on tamoxifen ), and CKD 3B  admitted on 10/02/2023 with left-sided weakness after being found down at home.    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  Further workup including MRI and perfusion study revealed very large core with only small area of penumbra to save therefore, not a candidate for thrombectomy and outside of the window for tPA. Failed bedside swallow screen. PT/OT consulted. MRI reviewed and concerning for large acute right MCA and ACA with associated cytotoxic edema and scattered petechial hemorrhage.  Mild intracranial mass effect with no midline shift.  Initially, the plan was to transfer to [facility] for further workup and time for outcomes.  However, patient had a more rapid deterioration of her respiratory status and worsening tachycardia.  Given previously expressed goals of care and progressive declines, decision was made to transition to full comfort care.  She is to remain at [facility] for end-of-life care.  Today, there was extensive family at bedside.  They share that the patient continued to have some respiratory distress and therefore was placed on a morphine  drip.  They feel her symptoms are better controlled on the drip and states that she appears comfortable at present.  HPI obtained from family as  patient is nonverbal.  Length of Stay: 1   Physical Exam Constitutional:      General: She is not in acute distress.    Appearance: She is ill-appearing.  HENT:     Mouth/Throat:     Mouth: Mucous membranes are dry.  Pulmonary:     Effort: Bradypnea present. No respiratory distress.     Comments: Irregular  Skin:    General: Skin is warm and dry.  Neurological:     Mental Status: She is unresponsive.             Vital Signs: BP (!) 145/98 (BP Location: Right Arm)   Pulse (!) 205   Temp 97.7 F (36.5 C) (Axillary)   Resp (!) 32   Ht 5' 4 (1.626 m)   Wt 67.7 kg   LMP 04/25/1978   SpO2 100%   BMI 25.62 kg/m  SpO2:  SpO2: 100 % O2 Device: O2 Device: Room Air O2 Flow Rate: O2 Flow Rate (L/min): 15 L/min      Palliative Assessment/Data: 10%   Palliative Care Assessment & Plan   Patient Profile/assessment:  83 year old female presented with large ischemic stroke with rapid decline.  Now on comfort care.  Comfort needs appear largely met.  She does have some terminal secretions being managed with Robinul  and scopolamine  patch.  Recommendations/Plan: Continue DNR/DNI/full comfort care Will add morphine  bolus as needed to morphine  drip to ensure optimal symptom management Continue Robinul  every 4 hours and scopolamine  patch for terminal secretions Spiritual care consult for family and end-of-life support (family does express desire for spiritual care follow-up) PMT to follow closely for end-of-life care, family support, and to ensure comfort needs are met  Symptom management:  Continue morphine  drip at 5mg /hr Added morphine  bolus 2-4 hrs q 15 mins PRN -breakthrough SOB, resp distress/pain Continue IV Ativan PRN Continue Robinul  q 4 hrs Continue Tylenol  PRN Continue Zofran  PRN Continue scopolamine  scheduled   Prognosis:  Hours - Days  Discharge Planning: Anticipated Hospital Death  Care plan was discussed with family and nursing staff.  I also completed chart  review including epic notes, vital signs, and flowsheets.   Billing based on MDM: High  Problems Addressed: One acute or chronic illness or injury that poses a threat to life or bodily function  Risks: Parenteral controlled substances, EOL symptom management          Render Carrie, NP  Palliative Medicine Team Team phone # [phone]  Thank you for allowing the Palliative Medicine Team to assist in the care of this patient. Please utilize secure chat with additional questions, if there is no response within 30 minutes please call the above phone number.  Palliative Medicine Team providers are available by phone from 7am to 7pm daily and can be reached through the team cell phone.  Should this patient require assistance outside of these hours, please call the patient's attending physician.

## 2023-10-04 NOTE — Progress Notes (Signed)
 Brief Summary:- 83 y.o. female with medical history significant for hypertension, hyperlipidemia, CAD status post CABG, permanent atrial fibrillation on Xarelto , history of breast cancer, and CKD 3B who presents with left-sided weakness after she was found down at home on 10/02/23, brain MRI showed  Subtotal Acute Right MCA and ACA territory infarcts with confluent cytotoxic edema and scattered petechial hemorrhage. -- Patient clinically deteriorated while awaiting transfer to [facility] and on 10/03/2023 was transition to full comfort care  A/p  1)Subtotal Acute Right MCA and ACA territory infarcts with confluent cytotoxic edema and scattered petechial hemorrhage. -- Patient remains unresponsive -Continue hospice/comfort care protocol -Anticipate in-hospital death -Continue morphine  drip, scopolamine  patch, -= As needed lorazepam --Plan of care discussed with family at bedside, questions answered   2)PAFib--- on Xarelto  prior to admission -- Continue full comfort care measures  3)CKD3A--continue comfort care measures  4)HTN--comfort care measures  Total care time 37 mins   Colin Dawley, MD

## 2023-10-05 DIAGNOSIS — I639 Cerebral infarction, unspecified: Secondary | ICD-10-CM | POA: Diagnosis not present

## 2023-10-05 DIAGNOSIS — Z7189 Other specified counseling: Secondary | ICD-10-CM | POA: Diagnosis not present

## 2023-10-05 DIAGNOSIS — Z515 Encounter for palliative care: Secondary | ICD-10-CM | POA: Diagnosis not present

## 2023-10-05 NOTE — Plan of Care (Signed)
 Pt comfort care at this time. Pt resting with eyes closed entire shift. Periods of labored breathing with predominantly shallow, bradypnic breathing.   Problem: Ischemic Stroke/TIA Tissue Perfusion: Goal: Complications of ischemic stroke/TIA will be minimized 10/05/2023 1532 by Goldie Later, RN Outcome: Not Met (add Reason) 10/05/2023 0755 by Goldie Later, RN Outcome: Progressing   Problem: Coping: Goal: Will verbalize positive feelings about self 10/05/2023 1532 by Goldie Later, RN Outcome: Not Met (add Reason) 10/05/2023 0755 by Goldie Later, RN Outcome: Progressing Goal: Will identify appropriate support needs 10/05/2023 1532 by Goldie Later, RN Outcome: Not Met (add Reason) 10/05/2023 0755 by Goldie Later, RN Outcome: Progressing   Problem: Health Behavior/Discharge Planning: Goal: Ability to manage health-related needs will improve 10/05/2023 1532 by Goldie Later, RN Outcome: Not Met (add Reason) 10/05/2023 0755 by Goldie Later, RN Outcome: Progressing Goal: Goals will be collaboratively established with patient/family 10/05/2023 1532 by Goldie Later, RN Outcome: Not Met (add Reason) 10/05/2023 0755 by Goldie Later, RN Outcome: Progressing   Problem: Self-Care: Goal: Ability to participate in self-care as condition permits will improve 10/05/2023 1532 by Goldie Later, RN Outcome: Not Met (add Reason) 10/05/2023 0755 by Goldie Later, RN Outcome: Progressing Goal: Verbalization of feelings and concerns over difficulty with self-care will improve 10/05/2023 1532 by Jahaan Vanwagner, Leverne Reading, RN Outcome: Not Met (add Reason) 10/05/2023 0755 by Goldie Later, RN Outcome: Progressing Goal: Ability to communicate needs accurately will improve 10/05/2023 1532 by Jarielys Girardot, Leverne Reading, RN Outcome: Not Met (add Reason) 10/05/2023 0755 by Goldie Later, RN Outcome: Progressing   Problem: Nutrition: Goal: Risk of aspiration will decrease 10/05/2023 1532 by Goldie Later, RN Outcome: Not Met (add  Reason) 10/05/2023 0755 by Goldie Later, RN Outcome: Progressing Goal: Dietary intake will improve 10/05/2023 1532 by Goldie Later, RN Outcome: Not Met (add Reason) 10/05/2023 0755 by Goldie Later, RN Outcome: Progressing   Problem: Education: Goal: Knowledge of General Education information will improve Description: Including pain rating scale, medication(s)/side effects and non-pharmacologic comfort measures 10/05/2023 1532 by Goldie Later, RN Outcome: Not Met (add Reason) 10/05/2023 0755 by Goldie Later, RN Outcome: Progressing   Problem: Health Behavior/Discharge Planning: Goal: Ability to manage health-related needs will improve 10/05/2023 1532 by Goldie Later, RN Outcome: Not Met (add Reason) 10/05/2023 0755 by Goldie Later, RN Outcome: Progressing   Problem: Clinical Measurements: Goal: Ability to maintain clinical measurements within normal limits will improve 10/05/2023 1532 by Goldie Later, RN Outcome: Not Met (add Reason) 10/05/2023 0755 by Goldie Later, RN Outcome: Progressing Goal: Will remain free from infection 10/05/2023 1532 by Goldie Later, RN Outcome: Not Met (add Reason) 10/05/2023 0755 by Goldie Later, RN Outcome: Progressing Goal: Diagnostic test results will improve 10/05/2023 1532 by Goldie Later, RN Outcome: Not Met (add Reason) 10/05/2023 0755 by Goldie Later, RN Outcome: Progressing Goal: Respiratory complications will improve 10/05/2023 1532 by Goldie Later, RN Outcome: Not Met (add Reason) 10/05/2023 0755 by Goldie Later, RN Outcome: Progressing Goal: Cardiovascular complication will be avoided 10/05/2023 1532 by Goldie Later, RN Outcome: Not Met (add Reason) 10/05/2023 0755 by Goldie Later, RN Outcome: Progressing   Problem: Activity: Goal: Risk for activity intolerance will decrease 10/05/2023 1532 by Goldie Later, RN Outcome: Not Met (add Reason) 10/05/2023 0755 by Goldie Later, RN Outcome: Progressing   Problem:  Nutrition: Goal: Adequate nutrition  will be maintained 10/05/2023 1532 by Goldie Later, RN Outcome: Not Met (add Reason) 10/05/2023 0755 by Goldie Later, RN Outcome: Progressing   Problem: Coping: Goal: Level of anxiety will decrease 10/05/2023 1532 by Goldie Later, RN Outcome: Not Met (add Reason) 10/05/2023 0755 by Goldie Later, RN Outcome: Progressing   Problem: Elimination: Goal: Will not experience complications related to bowel motility 10/05/2023 1532 by Goldie Later, RN Outcome: Not Met (add Reason) 10/05/2023 0755 by Goldie Later, RN Outcome: Progressing Goal: Will not experience complications related to urinary retention 10/05/2023 1532 by Goldie Later, RN Outcome: Not Met (add Reason) 10/05/2023 0755 by Goldie Later, RN Outcome: Progressing   Problem: Pain Managment: Goal: General experience of comfort will improve and/or be controlled 10/05/2023 1532 by Goldie Later, RN Outcome: Not Met (add Reason) 10/05/2023 0755 by Goldie Later, RN Outcome: Progressing   Problem: Safety: Goal: Ability to remain free from injury will improve 10/05/2023 1532 by Alyna Stensland, Leverne Reading, RN Outcome: Not Met (add Reason) 10/05/2023 0755 by Goldie Later, RN Outcome: Progressing   Problem: Skin Integrity: Goal: Risk for impaired skin integrity will decrease 10/05/2023 1532 by Goldie Later, RN Outcome: Not Met (add Reason) 10/05/2023 0755 by Goldie Later, RN Outcome: Progressing

## 2023-10-05 NOTE — Progress Notes (Signed)
 Brief Summary:- 83 y.o. female with medical history significant for hypertension, hyperlipidemia, CAD status post CABG, permanent atrial fibrillation on Xarelto , history of breast cancer, and CKD 3B who presents with left-sided weakness after she was found down at home on 10/02/23, brain MRI showed  Subtotal Acute Right MCA and ACA territory infarcts with confluent cytotoxic edema and scattered petechial hemorrhage. -- Patient clinically deteriorated while awaiting transfer to [facility] and on 10/03/2023 was transition to full comfort care  A/p  1)Subtotal Acute Right MCA and ACA territory infarcts with confluent cytotoxic edema and scattered petechial hemorrhage. -- Patient remains unresponsive -Continue hospice/comfort care protocol -Anticipate in-hospital death -Continue morphine  drip, scopolamine  patch, -= As needed lorazepam --Plan of care discussed with family at bedside, questions answered   2)PAFib--- on Xarelto  prior to admission -- Continue full comfort care measures  3)CKD3A--continue comfort care measures  4)HTN--comfort care measures  10/05/23 Overall patient remains comfortable, continue palliative/hospice/comfort care protocol - Family at bedside - Anticipate in-hospital death  Total care time 32 mins   Colin Dawley, MD

## 2023-10-05 NOTE — Progress Notes (Signed)
 Daily Progress Note   Patient Name: Sandra Bradford       Date: 10/05/2023 DOB: 11/14/1940  Age: 83 y.o. MRN#: [id number] Attending Physician: Colin Dawley, MD Primary Care Physician: Minus Amel, MD Admit Date: 10/02/2023  Reason for Consultation/Follow-up: Terminal Care  Subjective:  83 y.o. female  with past medical history of hypertension, hyperlipidemia (on Repatha , statin myopathy), CAD status post CABG, permanent atrial fibrillation on Xarelto , PAD, history of breast cancer (on tamoxifen ), and CKD 3B  admitted on 10/02/2023 with left-sided weakness after being found down at home.    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  Further workup including MRI and perfusion study revealed very large core with only small area of penumbra to save therefore, not a candidate for thrombectomy and outside of the window for tPA. Failed bedside swallow screen. PT/OT consulted. MRI reviewed and concerning for large acute right MCA and ACA with associated cytotoxic edema and scattered petechial hemorrhage.  Mild intracranial mass effect with no midline shift.   Initially, the plan was to transfer to [facility] for further workup and time for outcomes.  However, patient had a more rapid deterioration of her respiratory status and worsening tachycardia.  Given previously expressed goals of care and progressive declines, decision was made to transition to full comfort care.  She is to remain at [facility] for end-of-life care.  Today, family remains very supportive and at bedside. They are closely monitoring her and feel overall she has been comfortable. There was a concern overnight for her gripping the sheet tightly with her right hand. They also noticed very slow and irregular breathing. 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  Skin has been intermittently hot and then occasionally cold to touch.  No respiratory distress, grimacing, or moaning reported by her family. She has had decreased urine output. Overall, they feel she is comfortable.   They are interested in having chaplain come visit to provide support.   HPI obtained from family/nursing staff as patient is nonverbal.   Length of Stay: 2   Physical Exam Constitutional:      General: She is not in acute distress.    Appearance: She is ill-appearing.  Pulmonary:     Effort: Bradypnea present. No respiratory distress.     Comments: Irregular, periods of apnea  Skin:    General: Skin is warm and dry.   Neurological:     Mental Status: She is unresponsive.             Vital Signs: BP (!) 145/98 (  BP Location: Right Arm)   Pulse (!) 205   Temp 97.7 F (36.5 C) (Axillary)   Resp (!) 32   Ht 5' 4 (1.626 m)   Wt 67.7 kg   LMP 04/25/1978   SpO2 100%   BMI 25.62 kg/m  SpO2: SpO2: 100 % O2 Device: O2 Device: Room Air O2 Flow Rate: O2 Flow Rate (L/min): 15 L/min      Palliative Assessment/Data: 10%   Palliative Care Assessment & Plan   Patient Profile/Assessment:  83 year old female presented with large ischemic stroke with a rapid decline.  Now on comfort care. Overall, comfort needs appear met. Terminal secretions appear improved. No distress reported. Appears to be progressing closer to end of life (irregular respirations, varied body temp, cessation of urination etc.).  Given some concerns for pain as patient was gripping the sheet overnight will slightly increase morphine  drip.    Recommendations/Plan: Continue DNR/DNI/full comfort care Increase morphine  drip.  See below for details. Spiritual care consult requested Continue unrestricted visitor status to allow family to remain at her bedside as she progresses to end-of-life PMT to continue to follow closely for end-of-life care, family support, and  to ensure comfort needs are met   Symptom management: Will adjust morphine  drip parameters to allow nursing to titrate up to 7 mg/h if needing frequent boluses (> or = 2 boluses in 1 hr) Continue morphine  bolus 2 to 4 mg every 15 minutes as needed Continue Zofran  as needed Continue Ativan as needed Continue Robinul  every 4 hours Continue scopolamine  patch Continue Tylenol  as needed  Prognosis:  Hours - Days  Discharge Planning: Anticipated Hospital Death  Care plan was discussed with bedside nursing staff and family.   Billing based on MDM: High  Problems Addressed: One acute or chronic illness or injury that poses a threat to life or bodily function   Risks: Parenteral controlled substances adjustment/monitoring, EOL care, nearing death         Render Carrie, NP  Palliative Medicine Team Team phone # [phone]  Thank you for allowing the Palliative Medicine Team to assist in the care of this patient. Please utilize secure chat with additional questions, if there is no response within 30 minutes please call the above phone number.  Palliative Medicine Team providers are available by phone from 7am to 7pm daily and can be reached through the team cell phone.  Should this patient require assistance outside of these hours, please call the patient's attending physician.

## 2023-10-06 DIAGNOSIS — I639 Cerebral infarction, unspecified: Secondary | ICD-10-CM | POA: Diagnosis not present

## 2023-10-06 MED ORDER — GLYCOPYRROLATE 0.2 MG/ML IJ SOLN
0.4000 mg | Freq: Four times a day (QID) | INTRAMUSCULAR | Status: DC | PRN
  Administered 2023-10-07 – 2023-10-09 (×4): 0.4 mg via INTRAVENOUS
  Filled 2023-10-06 (×4): qty 2

## 2023-10-06 NOTE — Progress Notes (Signed)
 Brief Summary:- 83 y.o. female with medical history significant for hypertension, hyperlipidemia, CAD status post CABG, permanent atrial fibrillation on Xarelto , history of breast cancer, and CKD 3B who presents with left-sided weakness after she was found down at home on 10/02/23, brain MRI showed  Subtotal Acute Right MCA and ACA territory infarcts with confluent cytotoxic edema and scattered petechial hemorrhage. -- Patient clinically deteriorated while awaiting transfer to [facility] and on 10/03/2023 was transition to full comfort care  A/p  1)Subtotal Acute Right MCA and ACA territory infarcts with confluent cytotoxic edema and scattered petechial hemorrhage. -- Patient remains unresponsive -Continue hospice/comfort care protocol -Anticipate in-hospital death -Continue morphine  drip, scopolamine  patch, -= As needed lorazepam  --Plan of care discussed with family at bedside, questions answered   2)PAFib--- was on Xarelto  prior to admission -No further anticoagulation -- Continue full comfort care measures  3)CKD3A--continue comfort care measures  4)HTN--comfort care measures  10/06/23 Overall patient remains comfortable, continue palliative/hospice/comfort care protocol - Family at bedside Chaplain visited with pt and family - Anticipate in-hospital death  Total care time 36 mins   Colin Dawley, MD

## 2023-10-06 NOTE — Plan of Care (Signed)
  Problem: Ischemic Stroke/TIA Tissue Perfusion: Goal: Complications of ischemic stroke/TIA will be minimized Outcome: Not Applicable   Problem: Coping: Goal: Will verbalize positive feelings about self Outcome: Not Applicable Goal: Will identify appropriate support needs Outcome: Not Applicable   Problem: Health Behavior/Discharge Planning: Goal: Ability to manage health-related needs will improve Outcome: Not Applicable Goal: Goals will be collaboratively established with patient/family Outcome: Not Applicable   Problem: Self-Care: Goal: Ability to participate in self-care as condition permits will improve Outcome: Not Applicable Goal: Verbalization of feelings and concerns over difficulty with self-care will improve Outcome: Not Applicable Goal: Ability to communicate needs accurately will improve Outcome: Not Applicable   Problem: Nutrition: Goal: Risk of aspiration will decrease Outcome: Not Applicable Goal: Dietary intake will improve Outcome: Not Applicable   Problem: Education: Goal: Knowledge of General Education information will improve Description: Including pain rating scale, medication(s)/side effects and non-pharmacologic comfort measures Outcome: Not Applicable   Problem: Health Behavior/Discharge Planning: Goal: Ability to manage health-related needs will improve Outcome: Not Applicable   Problem: Clinical Measurements: Goal: Ability to maintain clinical measurements within normal limits will improve Outcome: Not Applicable Goal: Will remain free from infection Outcome: Not Applicable Goal: Diagnostic test results will improve Outcome: Not Applicable Goal: Respiratory complications will improve Outcome: Not Applicable Goal: Cardiovascular complication will be avoided Outcome: Not Applicable   Problem: Activity: Goal: Risk for activity intolerance will decrease Outcome: Not Applicable   Problem: Nutrition: Goal: Adequate nutrition will be  maintained Outcome: Not Applicable   Problem: Coping: Goal: Level of anxiety will decrease Outcome: Not Applicable   Problem: Elimination: Goal: Will not experience complications related to bowel motility Outcome: Not Applicable Goal: Will not experience complications related to urinary retention Outcome: Not Applicable   Problem: Pain Managment: Goal: General experience of comfort will improve and/or be controlled Outcome: Not Applicable   Problem: Safety: Goal: Ability to remain free from injury will improve Outcome: Not Applicable   Problem: Skin Integrity: Goal: Risk for impaired skin integrity will decrease Outcome: Not Applicable

## 2023-10-06 NOTE — Progress Notes (Signed)
 Reason for Visit: Chaplain was following up with Pt and family at the request of Chaplain supervisor  Visit Time: 25 minutes  Visit Details:  Pt was lying Pt and unresponsive.  While the Pt did not respond to me in any way she did maintain a face of calm that appeared pain and anxiety free.  Family of Pt surrounded the room and were actively sharing funny stories and conducting good life review of Pt.  Family seemed to be supporting one another well.   Pt's Daughter Sandra Bradford mentioned that this will be the 3rd significant death she will be dealing with in the last year.  Her husband died a year ago, then another close relative, and now her mother.  Chaplain spoke with her about her plans for grief support and encouraged her to seek a grief community to wlak with during this journey.  Family supported the idea.   Chaplain offered prayer for the Pt and family was willing.  Plan of Care: Chaplain team will continue to follow until Pt passes.

## 2023-10-07 DIAGNOSIS — I639 Cerebral infarction, unspecified: Secondary | ICD-10-CM | POA: Diagnosis not present

## 2023-10-07 NOTE — Progress Notes (Signed)
 Brief Summary:- 83 y.o. female with medical history significant for hypertension, hyperlipidemia, CAD status post CABG, permanent atrial fibrillation on Xarelto , history of breast cancer, and CKD 3B who presents with left-sided weakness after she was found down at home on 10/02/23, brain MRI showed  Subtotal Acute Right MCA and ACA territory infarcts with confluent cytotoxic edema and scattered petechial hemorrhage. -- Patient clinically deteriorated while awaiting transfer to [facility] and on 10/03/2023 was transition to full comfort care  A/p  1)Subtotal Acute Right MCA and ACA territory infarcts with confluent cytotoxic edema and scattered petechial hemorrhage. -- Patient remains unresponsive -Continue hospice/comfort care protocol -Anticipate in-hospital death -Continue morphine  drip, scopolamine  patch, -= As needed lorazepam  --Plan of care discussed with family at bedside, questions answered   2)PAFib--- was on Xarelto  prior to admission -No further anticoagulation -- Continue full comfort care measures  3)CKD3A--continue comfort care measures  4)HTN--comfort care measures  10/07/23 Overall patient remains comfortable, continue palliative/hospice/comfort care protocol - Granddaughter Ms. Henery Locket and other Family members at bedside - Anticipate in-hospital death  Total care time 38 mins   Colin Dawley, MD

## 2023-10-07 NOTE — Plan of Care (Signed)
  Problem: Education: Goal: Knowledge of the prescribed therapeutic regimen will improve Outcome: Progressing   Problem: Coping: Goal: Ability to identify and develop effective coping behavior will improve Outcome: Progressing   Problem: Clinical Measurements: Goal: Quality of life will improve Outcome: Progressing  On comfort CARE Problem: Respiratory: Goal: Verbalizations of increased ease of respirations will increase Outcome: Progressing   Problem: Role Relationship: Goal: Family's ability to cope with current situation will improve Outcome: Progressing Goal: Ability to verbalize concerns, feelings, and thoughts to partner or family member will improve Outcome: Progressing   Problem: Pain Management: Goal: Satisfaction with pain management regimen will improve Outcome: Progressing  Appears comfortable in bed, family present, no signs of pain or distress. Unresponsive. Bath given and repositioning for comfort.

## 2023-10-07 NOTE — Progress Notes (Signed)
 Pt remains unresponsive, respirations shallow, cheyene-stokes at 8/min. Skin very warm to touch, no cyanosis or mottling noted. Continued edema of both upper and lower extremities, arms and hands propped up on pillows. Morphine  drip continues per order, pt appears comfortable. Daughters remain at bedside, deny any current needs. Reviewed expected s/s that may be seen during the dying process, questions answered to family's satisfaction. Advised to call for needs or change in pt status, both state understanding.

## 2023-10-08 DIAGNOSIS — I639 Cerebral infarction, unspecified: Secondary | ICD-10-CM | POA: Diagnosis not present

## 2023-10-08 NOTE — Progress Notes (Signed)
 Brief Summary:- 83 y.o. female with medical history significant for hypertension, hyperlipidemia, CAD status post CABG, permanent atrial fibrillation on Xarelto , history of breast cancer, and CKD 3B who presents with left-sided weakness after she was found down at home on 10/02/23, brain MRI showed  Subtotal Acute Right MCA and ACA territory infarcts with confluent cytotoxic edema and scattered petechial hemorrhage. -- Patient clinically deteriorated while awaiting transfer to [facility] and on 10/03/2023 was transition to full comfort care  A/p  1)Subtotal Acute Right MCA and ACA territory infarcts with confluent cytotoxic edema and scattered petechial hemorrhage. -- Patient remains unresponsive -Continue hospice/comfort care protocol -Anticipate in-hospital death -Continue morphine  drip, scopolamine  patch, -= As needed lorazepam  --Plan of care discussed with family at bedside, questions answered   2)PAFib--- was on Xarelto  prior to admission -No further anticoagulation -- Continue full comfort care measures  3)CKD3A--continue comfort care measures  4)HTN--comfort care measures  10/08/23 Overall patient remains comfortable, continue palliative/hospice/comfort care protocol -Scopolamine  patch on -Required as needed Robinul  -Continue continuous morphine  drip -Required as needed morphine  - Family members at bedside - Anticipate in-hospital death  Total care time 39 mins   Colin Dawley, MD

## 2023-10-08 NOTE — Plan of Care (Signed)
  Problem: Coping: Goal: Ability to identify and develop effective coping behavior will improve Outcome: Progressing   Problem: Respiratory: Goal: Verbalizations of increased ease of respirations will increase Outcome: Progressing   Problem: Role Relationship: Goal: Family's ability to cope with current situation will improve Outcome: Progressing

## 2023-10-09 DIAGNOSIS — Z7189 Other specified counseling: Secondary | ICD-10-CM | POA: Diagnosis not present

## 2023-10-09 DIAGNOSIS — Z515 Encounter for palliative care: Secondary | ICD-10-CM | POA: Diagnosis not present

## 2023-10-09 DIAGNOSIS — I639 Cerebral infarction, unspecified: Secondary | ICD-10-CM | POA: Diagnosis not present

## 2023-10-09 MED ORDER — POLYVINYL ALCOHOL 1.4 % OP SOLN
1.0000 [drp] | OPHTHALMIC | Status: DC | PRN
  Filled 2023-10-09: qty 15

## 2023-10-24 NOTE — Death Summary Note (Addendum)
 DEATH SUMMARY   Patient Details  Name: Sandra Bradford MRN: [id number] DOB: Mar 17, 1941 XLK:[id number], Autry Legions, MD Admission/Discharge Information   Admit Date:  2023/10/17  Date of Death: Date of Death: 10/24/2023  Time of Death: Time of Death: 1400  Length of Stay: 6   Principle Cause of death: Acute stroke  Hospital Diagnoses: Principal Problem:   Acute ischemic stroke [facility]) Active Problems:   CVA (cerebral vascular accident) [facility])   Palliative care patient   End of life care   CAD-CABG 2002, cath 06/11/14   Essential hypertension   CKD (chronic kidney disease) stage 3, GFR 30-59 ml/min (HCC)   Permanent atrial fibrillation [facility])   Hospital Course: Sandra Bradford is an 83 y.o. female with medical history significant for hypertension, hyperlipidemia, CAD status post CABG, permanent atrial fibrillation on Xarelto , history of breast cancer, and CKD 3B who presents with left-sided weakness after she was found down at home.   Patient was seen in her usual state at approximately 3 PM today and was later found down with left-sided weakness and decreased level of consciousness.  At her baseline, patient lives independently and performs all of her ADLs on her own.   ED Course: Upon arrival to the ED, patient is found to be afebrile and saturating upper 80s to 90s on room air with mild tachycardia and stable BP.  Labs are most notable for potassium 3.3, creatinine 0.99, and normal CBC.  CT is concerning for acute right MCA and right ACA distribution infarction.  CTA reveals acute distal right ACA occlusion and acute right MCA occlusion at the bifurcation.   Patient was evaluated by teleneurology in the ED and it was recommended that she be admitted to [facility] for further evaluation and management.  Assessment and Plan: Brief Summary:- 83 y.o. female with medical history significant for hypertension, hyperlipidemia, CAD status post CABG, permanent atrial fibrillation on Xarelto , history  of breast cancer, and CKD 3B who presents with left-sided weakness after she was found down at home on 10-17-2023, brain MRI showed  Subtotal Acute Right MCA and ACA territory infarcts with confluent cytotoxic edema and scattered petechial hemorrhage. -- Patient clinically deteriorated while awaiting transfer to [facility] and on 10/03/2023 was transitioned to full comfort care --Patient expired on 10/24/23 at 2 PM with family at bedside   A/p  1)Subtotal Acute Right MCA and ACA territory infarcts with confluent cytotoxic edema and scattered petechial hemorrhage. -- Patient remained unresponsive --Patient was transition to hospice/full comfort care protocol --Treated with continuous morphine  drip, scopolamine  patch, and As needed lorazepam  -Patient expired on 10-24-23 at 2 PM with family at bedside   2)PAFib--- was on Xarelto  prior to admission -No further anticoagulation -Full comfort care as above #1   3)CKD3A-- comfort care measures   4)HTN--comfort care measures  Consultations: Neurology and palliative care  The results of significant diagnostics from this hospitalization (including imaging, microbiology, ancillary and laboratory) are listed below for reference.   Significant Diagnostic Studies: MR BRAIN WO CONTRAST Result Date: 10/03/2023 CLINICAL DATA:  83 year old female code stroke presentation yesterday. Acute right MCA, distal right ACA occlusion. Atrial fibrillation. On Xarelto . No thrombolysis given. EXAM: MRI HEAD WITHOUT CONTRAST TECHNIQUE: Multiplanar, multiecho pulse sequences of the brain and surrounding structures were obtained without intravenous contrast. COMPARISON:  CT head, CTA, CTP yesterday. FINDINGS: Brain: Subtotal right MCA and ACA territory restricted diffusion. Right PCA territory is spared. Confluent gyral T2 and FLAIR hyperintense cytotoxic edema. Similar edema in the  right basal ganglia. Scattered petechial hemorrhage in both of the affected vascular  territories, confluent in the right superior frontal and middle gyri. Mass effect on the right lateral ventricle but no midline shift at this time. No ventriculomegaly. Basilar cisterns remain patent. No left hemisphere or posterior fossa diffusion restriction. No chronic cortical encephalomalacia identified. No convincing chronic underlying cerebral blood products. Vascular: Major intracranial vascular flow voids are preserved. Skull and upper cervical spine: Negative. Sinuses/Orbits: Postoperative changes to both globes, otherwise negative. Other: Visible internal auditory structures appear normal. Negative visible scalp and face. IMPRESSION: 1. Subtotal Acute Right MCA and ACA territory infarcts with confluent cytotoxic edema and scattered petechial hemorrhage. 2. No malignant hemorrhagic transformation. And only mild intracranial mass effect at this time with no midline shift. Electronically Signed   By: Marlise Simpers M.D.   On: 10/03/2023 07:37   CT CEREBRAL PERFUSION W CONTRAST Result Date: 10/02/2023 CLINICAL DATA:  Initial evaluation for acute stroke. EXAM: CT PERFUSION BRAIN TECHNIQUE: Multiphase CT imaging of the brain was performed following IV bolus contrast injection. Subsequent parametric perfusion maps were calculated using RAPID software. RADIATION DOSE REDUCTION: This exam was performed according to the departmental dose-optimization program which includes automated exposure control, adjustment of the mA and/or kV according to patient size and/or use of iterative reconstruction technique. CONTRAST:  40mL OMNIPAQUE  IOHEXOL  350 MG/ML SOLN COMPARISON:  CTs from earlier the same day FINDINGS: CT Brain Perfusion Findings: CBF (<30%) Volume: Perfusion (Tmax>6.0s) volume: Mismatch Volume: 58mL ASPECTS on noncontrast CT Head: 3 at 07/13/2007 today. Infarct Core: 165 mL Infarction Location:Acute right MCA and ACA distribution infarcts. 58 mL surrounding ischemic penumbra. IMPRESSION: Acute right MCA  and ACA distribution infarcts. 165 mL core infarct with 58 mL surrounding ischemic penumbra. Electronically Signed   By: Virgia Griffins M.D.   On: 10/02/2023 23:44   CT ANGIO HEAD NECK W WO CM (CODE STROKE) Result Date: 10/02/2023 CLINICAL DATA:  Initial evaluation for acute neuro deficit, stroke suspected. EXAM: CT ANGIOGRAPHY HEAD AND NECK WITH AND WITHOUT CONTRAST TECHNIQUE: Multidetector CT imaging of the head and neck was performed using the standard protocol during bolus administration of intravenous contrast. Multiplanar CT image reconstructions and MIPs were obtained to evaluate the vascular anatomy. Carotid stenosis measurements (when applicable) are obtained utilizing NASCET criteria, using the distal internal carotid diameter as the denominator. RADIATION DOSE REDUCTION: This exam was performed according to the departmental dose-optimization program which includes automated exposure control, adjustment of the mA and/or kV according to patient size and/or use of iterative reconstruction technique. CONTRAST:  75mL OMNIPAQUE  IOHEXOL  350 MG/ML SOLN COMPARISON:  Head CT from earlier the same day. FINDINGS: CTA NECK FINDINGS Aortic arch: Aortic arch within normal limits for caliber with standard 3 vessel morphology. Moderate aortic atherosclerosis. Sequelae of prior CABG. No significant stenosis about the origin the great vessels. Right carotid system: Right common and internal carotid arteries are patent without dissection. Calcified plaque about the right carotid bulb with associated stenosis of up to 50% by NASCET criteria. Left carotid system: Left common and internal carotid arteries are patent without dissection. Moderate atheromatous change about the left carotid bulb without hemodynamically significant greater than 50% stenosis. Vertebral arteries: Both vertebral arteries arise from the subclavian arteries. No proximal subclavian artery stenosis. Atheromatous change about the origins of both  vertebral arteries with moderate stenoses bilaterally. Vertebral arteries otherwise patent without significant stenosis or dissection. Skeleton: No worrisome osseous lesions. Other neck: No other acute finding. 2.1 cm left  thyroid  nodule. This has been previously evaluated by thyroid  ultrasound and biopsy, most recently in 2017 (ref: J Am Coll Radiol. 2015 Feb;12(2): 143-50). Upper chest: Moderate right with small left layering pleural effusions. Review of the MIP images confirms the above findings CTA HEAD FINDINGS Anterior circulation: Atheromatous change about the carotid siphons without hemodynamically significant stenosis. A1 segments patent bilaterally. Normal anterior communicating artery complex. Left ACA patent without stenosis. There is acute distal right ACA occlusion, A4 segment (a series 5, image 152). Left M1 segment and distal left MCA branches are patent well perfused. On the right, there is acute occlusion of the right MCA at the right MCA bifurcation/proximal right M2 segment (series 5, image 230). And anterior temporal branch remains patent. Posterior circulation: Both V4 segments patent without significant stenosis. Both PICA patent at their origins. Basilar diminutive but patent without stenosis. Superior cerebral arteries patent bilaterally. Fetal type origin of the PCAs bilaterally. Both PCAs patent to their distal aspects without significant stenosis. Venous sinuses: Patent allowing for timing the contrast bolus. Anatomic variants: As above.  No aneurysm. Review of the MIP images confirms the above findings IMPRESSION: 1. Acute distal right ACA occlusion, A4 segment. 2. Acute right MCA occlusion at the right MCA bifurcation/proximal right M2 segment. 3. Atheromatous change about the carotid bifurcations with associated stenosis of up to 50% on the right. 4. Moderate stenoses about the origins of both vertebral arteries. 5. Moderate right with small left layering pleural effusions. 6.  Aortic  Atherosclerosis (ICD10-I70.0). Critical Value/emergent results were called by telephone at the time of interpretation on 10/02/2023 at 11:05 p.m. to provider [facility] , who verbally acknowledged these results. Electronically Signed   By: Virgia Griffins M.D.   On: 10/02/2023 23:25   CT HEAD CODE STROKE WO CONTRAST Result Date: 10/02/2023 CLINICAL DATA:  Code stroke. EXAM: CT HEAD WITHOUT CONTRAST TECHNIQUE: Contiguous axial images were obtained from the base of the skull through the vertex without intravenous contrast. RADIATION DOSE REDUCTION: This exam was performed according to the departmental dose-optimization program which includes automated exposure control, adjustment of the mA and/or kV according to patient size and/or use of iterative reconstruction technique. COMPARISON:  Prior CT from 05/20/2021 FINDINGS: Brain: Generalized age-related cerebral atrophy with chronic small vessel ischemic disease. Evolving cytotoxic edema involving the right cerebral hemisphere, consistent with evolving acute right ACA and MCA distribution infarcts. No acute intracranial hemorrhage or significant regional mass effect at this time. Gray-white matter differentiation otherwise maintained. No mass lesion or midline shift. No hydrocephalus or extra-axial fluid collection. Vascular: Asymmetric density at the distal right M1 segment, suspicious for possible thrombus. Calcified atherosclerosis present at skull base. Skull: Scalp soft tissues demonstrate no acute finding. Calvarium intact. Sinuses/Orbits: Globes orbital soft tissues within normal limits. Paranasal sinuses are largely clear. No significant mastoid effusion. Other: None. ASPECTS [facility] Stroke Program Early CT Score) - Ganglionic level infarction (caudate, lentiform nuclei, internal capsule, insula, M1-M3 cortex): 3 - Supraganglionic infarction (M4-M6 cortex): 0 Total score (0-10 with 10 being normal): 3 IMPRESSION: 1. Evolving acute right ACA and MCA  distribution infarcts. No acute intracranial hemorrhage or significant regional mass effect at this time. 2. ASPECTS is 3 3. Underlying atrophy with chronic small vessel ischemic disease. Critical Value/emergent results were called by telephone at the time of interpretation on 10/02/2023 at 11:04 pm to provider [facility] , who verbally acknowledged these results. Electronically Signed   By: Virgia Griffins M.D.   On: 10/02/2023 23:09   US   RENAL Result Date: 09/14/2023 CLINICAL DATA:  Chronic renal disease EXAM: RENAL / URINARY TRACT ULTRASOUND COMPLETE COMPARISON:  CT abdomen pelvis 06/28/2013 FINDINGS: Right Kidney: Renal measurements: 7.7 x 4.4 x 4.6 cm = volume: 81.1 mL. Normal renal cortical thickness and echogenicity. No hydronephrosis. 7 mm too small to characterize hypoechoic lesion right kidney. Left Kidney: Renal measurements: 8.2 x 4.5 x 4.7 cm = volume: 88.6 mL. Echogenicity within normal limits. No mass or hydronephrosis visualized. Bladder: Appears normal for degree of bladder distention. Other: None. IMPRESSION: No hydronephrosis. Electronically Signed   By: Jone Neither M.D.   On: 09/14/2023 22:02    Time spent: 37 minutes  Signed: Colin Dawley, MD 10/20/2023

## 2023-10-24 NOTE — Progress Notes (Addendum)
 Palliative: Sandra Bradford is full comfort care.  She is lying quietly in bed.  She appears to be actively dying.  She is unable to make her basic needs known.  Her daughter Sandra Bradford and granddaughter are present at bedside.  We talked about symptom management needs.  We talked about residential hospice benefits.  Tammy states that she will reach out to her sister and brother for further discussion.  They plan for a phone call with social work around 3 PM.  Conference with attending, bedside nursing staff, transition of care team related to patient condition, needs, goals of care, disposition.  Plan: Full comfort care.  Considering GIP/residential hospice placement, provider of choice [facility].  Symptom management needs addressed/adjusted.  50 minutes  Branko Steeves, NP Palliative medicine team Team phone [phone]

## 2023-10-24 NOTE — TOC Progression Note (Addendum)
 Transition of Care [facility]) - Progression Note    Patient Details  Name: Sandra Bradford MRN: [id number] Date of Birth: 03-Nov-1940  Transition of Care [facility]) CM/SW Contact  Grandville Lax, [place] Phone Number: 09/26/2023, 2:11 PM  Clinical Narrative:    CSW updated that pts family is agreeable to speak with [facility] about GIP and residential hospice. CSW spoke to Alliance with [facility] who states she will reach out to pts daughter [name] and answer any questions family has. Kwante to update CSW when call completed. TOC to follow.   Addendum 2:14: CSW updated of pts passing, Kwante with [facility] updated and will not reach out to the family.     Barriers to Discharge: No Barriers Identified  Expected Discharge Plan and Services                                               Social Determinants of Health (SDOH) Interventions SDOH Screenings   Food Insecurity: Patient Unable To Answer (10/03/2023)  Housing: Patient Unable To Answer (10/03/2023)  Transportation Needs: Patient Unable To Answer (10/03/2023)  Utilities: Patient Unable To Answer (10/03/2023)  Social Connections: Patient Unable To Answer (10/03/2023)  Tobacco Use: Low Risk  (10/03/2023)    Readmission Risk Interventions     No data to display

## 2023-10-24 NOTE — Progress Notes (Signed)
 Per patient placement, pt's case must be reviewed by medical examiner due to fall prior to admission. Riesa Chary, medical examiner for [facility] notified by phone [phone]) and advised of pt's death, admitting Dx and course of admission. He states that ME exam is declined.

## 2023-10-24 NOTE — Progress Notes (Signed)
 Called to pt's room by family, states they think their mom has passed away. In to room to find pt lying in bed, no respirations, no audible heart tones x 1 minute. TOD 1400. Sympathy offered to all family members present. Family members deny any needs a present, just ask that they be allowed to be alone in room with patient for a while. MD Courage notified of pt's death. Morphine  drip stopped and removed from room and wasted per hospital policy.

## 2023-10-24 DEATH — deceased

## 2023-12-05 ENCOUNTER — Ambulatory Visit: Payer: PPO | Admitting: Nurse Practitioner

## 2023-12-05 ENCOUNTER — Other Ambulatory Visit: Payer: PPO

## 2023-12-06 ENCOUNTER — Ambulatory Visit: Payer: PPO | Admitting: Nurse Practitioner

## 2023-12-06 ENCOUNTER — Other Ambulatory Visit: Payer: PPO
